# Patient Record
Sex: Female | Born: 1965 | Race: Black or African American | Hispanic: No | Marital: Married | Smoking: Never smoker
Health system: Southern US, Community
[De-identification: ages and names within clinical notes are randomized; demographics above are authoritative.]

## PROBLEM LIST (undated history)

## (undated) DIAGNOSIS — N6009 Solitary cyst of unspecified breast: Secondary | ICD-10-CM

## (undated) DIAGNOSIS — R42 Dizziness and giddiness: Secondary | ICD-10-CM

## (undated) DIAGNOSIS — F419 Anxiety disorder, unspecified: Secondary | ICD-10-CM

## (undated) DIAGNOSIS — K219 Gastro-esophageal reflux disease without esophagitis: Secondary | ICD-10-CM

## (undated) DIAGNOSIS — G44309 Post-traumatic headache, unspecified, not intractable: Secondary | ICD-10-CM

## (undated) DIAGNOSIS — N879 Dysplasia of cervix uteri, unspecified: Secondary | ICD-10-CM

## (undated) DIAGNOSIS — L709 Acne, unspecified: Secondary | ICD-10-CM

## (undated) DIAGNOSIS — R519 Headache, unspecified: Secondary | ICD-10-CM

## (undated) DIAGNOSIS — R51 Headache: Secondary | ICD-10-CM

## (undated) HISTORY — PX: MOUTH SURGERY: SHX715

## (undated) HISTORY — DX: Solitary cyst of unspecified breast: N60.09

## (undated) HISTORY — DX: Dysplasia of cervix uteri, unspecified: N87.9

## (undated) HISTORY — DX: Post-traumatic headache, unspecified, not intractable: G44.309

## (undated) HISTORY — DX: Headache: R51

## (undated) HISTORY — DX: Headache, unspecified: R51.9

## (undated) HISTORY — PX: COLONOSCOPY: SHX174

## (undated) HISTORY — DX: Dizziness and giddiness: R42

## (undated) HISTORY — PX: DILATION AND CURETTAGE OF UTERUS: SHX78

---
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
## 1995-05-29 DIAGNOSIS — N879 Dysplasia of cervix uteri, unspecified: Secondary | ICD-10-CM

## 1995-05-29 HISTORY — DX: Dysplasia of cervix uteri, unspecified: N87.9

## 1995-05-29 HISTORY — PX: GYNECOLOGIC CRYOSURGERY: SHX857

## 1997-07-24 ENCOUNTER — Inpatient Hospital Stay (HOSPITAL_COMMUNITY): Admission: AD | Admit: 1997-07-24 | Discharge: 1997-07-24 | Payer: Self-pay | Admitting: Gynecology

## 1997-07-26 ENCOUNTER — Ambulatory Visit (HOSPITAL_COMMUNITY): Admission: AD | Admit: 1997-07-26 | Discharge: 1997-07-26 | Payer: Self-pay | Admitting: Obstetrics and Gynecology

## 1997-09-01 ENCOUNTER — Encounter: Admission: RE | Admit: 1997-09-01 | Discharge: 1997-11-30 | Payer: Self-pay | Admitting: Gynecology

## 1997-09-29 ENCOUNTER — Other Ambulatory Visit: Admission: RE | Admit: 1997-09-29 | Discharge: 1997-09-29 | Payer: Self-pay | Admitting: Gynecology

## 1998-03-17 ENCOUNTER — Inpatient Hospital Stay (HOSPITAL_COMMUNITY): Admission: AD | Admit: 1998-03-17 | Discharge: 1998-03-17 | Payer: Self-pay | Admitting: Gynecology

## 1998-03-18 ENCOUNTER — Inpatient Hospital Stay (HOSPITAL_COMMUNITY): Admission: AD | Admit: 1998-03-18 | Discharge: 1998-03-20 | Payer: Self-pay | Admitting: Obstetrics and Gynecology

## 1998-03-21 ENCOUNTER — Encounter (HOSPITAL_COMMUNITY): Admission: RE | Admit: 1998-03-21 | Discharge: 1998-06-19 | Payer: Self-pay | Admitting: Gynecology

## 1998-04-29 ENCOUNTER — Other Ambulatory Visit: Admission: RE | Admit: 1998-04-29 | Discharge: 1998-04-29 | Payer: Self-pay | Admitting: Gynecology

## 1998-06-23 ENCOUNTER — Encounter (HOSPITAL_COMMUNITY): Admission: RE | Admit: 1998-06-23 | Discharge: 1998-09-21 | Payer: Self-pay | Admitting: *Deleted

## 1998-09-22 ENCOUNTER — Encounter (HOSPITAL_COMMUNITY): Admission: RE | Admit: 1998-09-22 | Discharge: 1998-12-21 | Payer: Self-pay | Admitting: *Deleted

## 2002-03-18 ENCOUNTER — Inpatient Hospital Stay (HOSPITAL_COMMUNITY): Admission: AD | Admit: 2002-03-18 | Discharge: 2002-03-18 | Payer: Self-pay | Admitting: Gynecology

## 2002-03-18 ENCOUNTER — Encounter: Payer: Self-pay | Admitting: Gynecology

## 2002-03-28 ENCOUNTER — Inpatient Hospital Stay (HOSPITAL_COMMUNITY): Admission: AD | Admit: 2002-03-28 | Discharge: 2002-03-31 | Payer: Self-pay | Admitting: Gynecology

## 2002-05-12 ENCOUNTER — Other Ambulatory Visit: Admission: RE | Admit: 2002-05-12 | Discharge: 2002-05-12 | Payer: Self-pay | Admitting: Gynecology

## 2003-04-28 ENCOUNTER — Ambulatory Visit (HOSPITAL_BASED_OUTPATIENT_CLINIC_OR_DEPARTMENT_OTHER): Admission: RE | Admit: 2003-04-28 | Discharge: 2003-04-28 | Payer: Self-pay | Admitting: Gynecology

## 2003-04-28 ENCOUNTER — Ambulatory Visit (HOSPITAL_COMMUNITY): Admission: RE | Admit: 2003-04-28 | Discharge: 2003-04-28 | Payer: Self-pay | Admitting: Gynecology

## 2003-04-28 ENCOUNTER — Encounter (INDEPENDENT_AMBULATORY_CARE_PROVIDER_SITE_OTHER): Payer: Self-pay | Admitting: *Deleted

## 2003-06-29 ENCOUNTER — Other Ambulatory Visit: Admission: RE | Admit: 2003-06-29 | Discharge: 2003-06-29 | Payer: Self-pay | Admitting: Gynecology

## 2003-07-20 ENCOUNTER — Ambulatory Visit (HOSPITAL_COMMUNITY): Admission: RE | Admit: 2003-07-20 | Discharge: 2003-07-20 | Payer: Self-pay | Admitting: Gynecology

## 2004-01-14 ENCOUNTER — Encounter (INDEPENDENT_AMBULATORY_CARE_PROVIDER_SITE_OTHER): Payer: Self-pay | Admitting: *Deleted

## 2004-01-14 ENCOUNTER — Ambulatory Visit (HOSPITAL_COMMUNITY): Admission: RE | Admit: 2004-01-14 | Discharge: 2004-01-14 | Payer: Self-pay | Admitting: Gynecology

## 2004-04-15 ENCOUNTER — Inpatient Hospital Stay (HOSPITAL_COMMUNITY): Admission: AD | Admit: 2004-04-15 | Discharge: 2004-04-15 | Payer: Self-pay | Admitting: Gynecology

## 2004-07-17 ENCOUNTER — Other Ambulatory Visit: Admission: RE | Admit: 2004-07-17 | Discharge: 2004-07-17 | Payer: Self-pay | Admitting: Gynecology

## 2005-08-01 ENCOUNTER — Other Ambulatory Visit: Admission: RE | Admit: 2005-08-01 | Discharge: 2005-08-01 | Payer: Self-pay | Admitting: Gynecology

## 2006-05-28 HISTORY — PX: ENDOMETRIAL ABLATION: SHX621

## 2006-08-07 ENCOUNTER — Other Ambulatory Visit: Admission: RE | Admit: 2006-08-07 | Discharge: 2006-08-07 | Payer: Self-pay | Admitting: Gynecology

## 2007-08-19 ENCOUNTER — Other Ambulatory Visit: Admission: RE | Admit: 2007-08-19 | Discharge: 2007-08-19 | Payer: Self-pay | Admitting: Gynecology

## 2008-03-25 ENCOUNTER — Ambulatory Visit: Payer: Self-pay | Admitting: Gynecology

## 2008-08-19 ENCOUNTER — Ambulatory Visit: Payer: Self-pay | Admitting: Gynecology

## 2008-08-19 ENCOUNTER — Encounter: Payer: Self-pay | Admitting: Gynecology

## 2008-08-19 ENCOUNTER — Other Ambulatory Visit: Admission: RE | Admit: 2008-08-19 | Discharge: 2008-08-19 | Payer: Self-pay | Admitting: Gynecology

## 2008-10-12 ENCOUNTER — Ambulatory Visit: Payer: Self-pay | Admitting: Gynecology

## 2008-12-15 ENCOUNTER — Ambulatory Visit: Payer: Self-pay | Admitting: Gynecology

## 2009-04-06 ENCOUNTER — Ambulatory Visit: Payer: Self-pay | Admitting: Gynecology

## 2009-08-23 ENCOUNTER — Ambulatory Visit: Payer: Self-pay | Admitting: Gynecology

## 2009-08-23 ENCOUNTER — Other Ambulatory Visit: Admission: RE | Admit: 2009-08-23 | Discharge: 2009-08-23 | Payer: Self-pay | Admitting: Gynecology

## 2010-04-25 ENCOUNTER — Ambulatory Visit: Payer: Self-pay | Admitting: Gynecology

## 2010-07-11 ENCOUNTER — Ambulatory Visit: Payer: BC Managed Care – PPO | Admitting: Gynecology

## 2010-07-11 DIAGNOSIS — N6009 Solitary cyst of unspecified breast: Secondary | ICD-10-CM

## 2010-08-25 ENCOUNTER — Encounter: Payer: Self-pay | Admitting: Gynecology

## 2010-08-28 ENCOUNTER — Other Ambulatory Visit (HOSPITAL_COMMUNITY)
Admission: RE | Admit: 2010-08-28 | Discharge: 2010-08-28 | Disposition: A | Discharge status: Home / Self Care | Payer: BC Managed Care – PPO | Source: Ambulatory Visit | Attending: Gynecology | Admitting: Gynecology

## 2010-08-28 ENCOUNTER — Encounter (INDEPENDENT_AMBULATORY_CARE_PROVIDER_SITE_OTHER): Payer: BC Managed Care – PPO | Admitting: Gynecology

## 2010-08-28 ENCOUNTER — Other Ambulatory Visit: Payer: Self-pay | Admitting: Gynecology

## 2010-08-28 DIAGNOSIS — Z01419 Encounter for gynecological examination (general) (routine) without abnormal findings: Secondary | ICD-10-CM

## 2010-08-28 DIAGNOSIS — Z124 Encounter for screening for malignant neoplasm of cervix: Secondary | ICD-10-CM | POA: Insufficient documentation

## 2010-10-13 NOTE — H&P (Signed)
NAMEDOLORES, EWING                        ACCOUNT NO.:  [id number]   MEDICAL RECORD NO.:  [id number]                   PATIENT TYPE:  AMB   LOCATION:  [facility]                                 FACILITY:  [facility]   PHYSICIAN:  Ivor Costa. Farrel Gobble, M.D.              DATE OF BIRTH:  1966-04-23   DATE OF ADMISSION:  DATE OF DISCHARGE:                                HISTORY & PHYSICAL   HISTORY OF PRESENT ILLNESS:  The patient is a 45 year old, G4, P2, with a  history of erratic periods since September.  The patient had variable length  cycles of approximately two to three weeks in duration with flow varying  from 10-12 days since September.  She is currently contracepting with a  vasectomy.  The patient had TSH and prolactin, both of which were normal.  We attempted to do a sonohysterogram in the office, however, despite two  attempts, we were unable to distend the uterine wall because of uterine  expulsion of the fluid.  Based on that the patient would like to go ahead  and have diagnostic hysteroscopy done for evaluation of her lining.  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  She has normal Pap smears.  She has  had regular cycles, contracepting with a vasectomy.  She has had two vaginal  deliveries without difficulty.   PAST MEDICAL HISTORY:  Negative.   PAST SURGICAL HISTORY:  Significant for a D&C in the past.   ALLERGIES:  MACROBID.   SOCIAL HISTORY:  She is married.  No tobacco, alcohol, or caffeine.   FAMILY HISTORY:  Negative for GYN cancers.   PHYSICAL EXAMINATION:  GENERAL APPEARANCE:  She is well appearing and in no  acute distress.  HEART:  Regular rate.  LUNGS:  Clear to auscultation.  ABDOMEN:  Soft without rebound or guarding.  GYNECOLOGICAL:  She has normal external female genitalia.  The BUS was  negative.  The vagina was pink and moist.  The cervix was parous.  The  uterus was  anteverted, mobile, and nontender.  The adnexa were without  tenderness or fullness.  Her ultrasound showed her uterus to be 9.7 x 4.7 x  6.1 with normal-appearing ovaries.  Again, the sonohysterogram was unable to  be performed.   ASSESSMENT:  A three-month history of dysfunctional bleeding.  Normal  hormonal studies.  The patient will present for diagnostic hysteroscopy to  evaluate the lining.  She had a laminaria placed the day before.  All  questions were addressed.  She was given a postoperative pain prescription  of Darvocet-N 100.                                               French Ana  Angelica Pou, M.D.    THL/MEDQ  D:  04/27/2003  T:  04/27/2003  Job:  [id number]

## 2010-10-13 NOTE — Op Note (Signed)
NAME:  Debra Combs, Debra Combs                        ACCOUNT NO.:  [id number]   MEDICAL RECORD NO.:  [id number]                   PATIENT TYPE:  AMB   LOCATION:  [facility]C                                  FACILITY:  [facility]   PHYSICIAN:  Ivor Costa. Farrel Gobble, M.D.              DATE OF BIRTH:  [birth year]   DATE OF PROCEDURE:  01/14/2004  DATE OF DISCHARGE:                                 OPERATIVE REPORT   PREOPERATIVE DIAGNOSES:  __________ pregnancy at 8 weeks.   POSTOPERATIVE DIAGNOSES:  __________ pregnancy at 8 weeks.   PROCEDURE:  First trimester D & E.   SURGEON:  Tracy H. Farrel Gobble, M.D.   ANESTHESIA:  MAC with a paracervical block.   FINDINGS:  Smooth uterine contours.   PATHOLOGY:  Uterine contents.   DESCRIPTION OF PROCEDURE:  The patient was taken to the operating room, a  light MAC was placed, prepped and draped in the usual sterile fashion. A  bimanual exam was performed, orientation of the uterus was confirmed. The  bivalve speculum was placed in the vagina, cervix was visualized and a  paracervical block with equal portions of 2% lidocaine and 0.25% Marcaine  was placed circumferentially around the cervix. The cervix was then  stabilized with a single tooth tenaculum. The orientation of the canal was  confirmed with the sound. The uterus was then gently dilated up to 29 [place]  after which a #9 suction curette was advanced through the cervix towards the  fundus. Suction was obtained, uterus was cleared of its contents. Gentle  curetting afterwards assured a smooth wall and good cry.  The patient  tolerated the procedure well. Sponge, sharp needle counts were correct and  she was given Methergine postoperatively.                                               Ivor Costa. Farrel Gobble, M.D.    THL/MEDQ  D:  01/14/2004  T:  01/14/2004  Job:  [id number]

## 2010-10-13 NOTE — Discharge Summary (Signed)
   Debra Combs, Debra Combs                        ACCOUNT NO.:  [id number]   MEDICAL RECORD NO.:  [id number]                   PATIENT TYPE:  INP   LOCATION:  9143                                 FACILITY:  [facility]   PHYSICIAN:  Ivor Costa. Farrel Gobble, M.D.              DATE OF BIRTH:  16-Mar-1966   DATE OF ADMISSION:  03/28/2002  DATE OF DISCHARGE:  03/31/2002                                 DISCHARGE SUMMARY   DISCHARGE DIAGNOSES:  1. Intrauterine pregnancy at term.  2. Spontaneous onset of labor.   PROCEDURE:  Vacuum assisted vaginal delivery of viable infant over intact  perineum with a para second degree laceration.   HISTORY OF PRESENT ILLNESS:  Patient is a 45 year old, gravida 5, para 1-0-3-  1 with an LMP of 06/28/00.  [facility] 04/04/02.  Prenatal course was complicated by  advanced paternal age and history of urinary tract infection.   LABORATORY TESTS:  Blood type O negative, antibody screen negative.  RPR,  HBsAg, HIB  nonreactive.  Rubella immune.  GBS negative.   HOSPITAL COURSE AND TREATMENT:  Patient was admitted 03/28/02 with  spontaneous onset of labor.  She progressed to complete dilation secondary  to variable deceleration and terminal bradycardia.  Delivery was  accomplished with vacuum assistance.  She delivered an Apgar 6, 8, female  infant.  Birth weight 8 pounds, 8 ounces.  Over intact perineum with a pair  of second degree midline laceration.   POSTPARTUM COURSE:  Patient remained afebrile and was able to be discharged  in satisfactory condition on her second post partum day.  CBC hematocrit:  32.9, hemoglobin: 11.5, WBC: 11.8, platelets: 189.  Patient did receive  RhoGAM prior to discharge.   DISPOSITION:  Follow up in 6 weeks.  Continue Prenatal vitamins.  Motrin for  pain.     Elwyn Lade . Hancock, N.P.                Ivor Costa. Farrel Gobble, M.D.    MKH/MEDQ  D:  04/27/2002  T:  04/27/2002  Job:  [id number]

## 2010-10-13 NOTE — Op Note (Signed)
[name], Debra Combs                        ACCOUNT NO.:  [id number]   MEDICAL RECORD NO.:  [id number]                   PATIENT TYPE:  AMB   LOCATION:  [facility]                                 FACILITY:  [facility]   PHYSICIAN:  Ivor Costa. Farrel Gobble, M.D.              DATE OF BIRTH:  Jun 09, 1965   DATE OF PROCEDURE:  04/28/2003  DATE OF DISCHARGE:                                 OPERATIVE REPORT   PREOPERATIVE DIAGNOSES:  1. Metrorrhagia.  2. Dysfunctional uterine bleeding.   POSTOPERATIVE DIAGNOSES:  1. Metrorrhagia.  2. Dysfunctional uterine bleeding.   PROCEDURE:  Dilatation and curettage, hysteroscopy.   SURGEON:  Ivor Costa. Farrel Gobble, M.D.   ANESTHESIA:  MAC.   ESTIMATED BLOOD LOSS:  Minimal.   I&O deficit of 3% sorbitol solution was less than 50 mL.   FINDINGS:  Normal cavity contours.  The uterus was anteverted and sounded to  8.  There was a small left endosalpingeal polyp.   PATHOLOGY:  Endometrial curettings and ECC.   DESCRIPTION OF PROCEDURE:  The patient was taken to the operating room.  A  laminaria placed the night before was removed.  IV sedation was placed.  She  was placed in the dorsal lithotomy position, prepped and draped in the usual  sterile fashion.  A bivalve speculum was placed at the vagina.  The cervix  was noted to be dilated.  The sound was placed and the contour of the uterus  was confirmed.  The uterus was stabilized with a single-tooth tenaculum.  It  was noted to be dilated greater than 25 [place]. We then passed the larger  curettes, and it was actually noted to be dilated above the 31 needed for  the operative scope; therefore, the operative scope was advanced through the  cervix toward the fundus.  The cavity contours were unremarkable.  There was  what is probably dilation artifact on the posterior wall.  This was taken  for pathology.  The right tubal ostium was unremarkable.  The left was noted  to have a small endosalpingeal polyp.  The base  where the defect had been  taken was slightly bleeding and treated with cautery.  A gentle uterine  curettage was then performed.  An endocervical curettage was also separately  performed.  Inspection of the cavity was consistent with shaggy endometrium  as a result of the curettings but otherwise unremarkable.  The instruments  were then removed from the vagina.  The patient tolerated the procedure  well.  Sponge, lap, and needle count was correct x2, and she was transferred  to the PACU in stable condition.                                               Ivor Costa. Farrel Gobble, M.D.  THL/MEDQ  D:  04/28/2003  T:  04/28/2003  Job:  [id number]

## 2010-10-19 ENCOUNTER — Emergency Department (HOSPITAL_COMMUNITY)
Admission: EM | Admit: 2010-10-19 | Discharge: 2010-10-20 | Disposition: A | Discharge status: Home / Self Care | Payer: BC Managed Care – PPO | Attending: Emergency Medicine | Admitting: Emergency Medicine

## 2010-10-19 ENCOUNTER — Emergency Department (HOSPITAL_COMMUNITY): Payer: BC Managed Care – PPO

## 2010-10-19 DIAGNOSIS — X500XXA Overexertion from strenuous movement or load, initial encounter: Secondary | ICD-10-CM | POA: Insufficient documentation

## 2010-10-19 DIAGNOSIS — S93609A Unspecified sprain of unspecified foot, initial encounter: Secondary | ICD-10-CM | POA: Insufficient documentation

## 2010-10-19 DIAGNOSIS — M79609 Pain in unspecified limb: Secondary | ICD-10-CM | POA: Insufficient documentation

## 2010-10-19 DIAGNOSIS — Y9301 Activity, walking, marching and hiking: Secondary | ICD-10-CM | POA: Insufficient documentation

## 2010-12-13 ENCOUNTER — Emergency Department (HOSPITAL_COMMUNITY)
Admission: EM | Admit: 2010-12-13 | Discharge: 2010-12-13 | Disposition: A | Discharge status: Home / Self Care | Payer: BC Managed Care – PPO | Attending: Emergency Medicine | Admitting: Emergency Medicine

## 2010-12-13 ENCOUNTER — Emergency Department (HOSPITAL_COMMUNITY): Payer: BC Managed Care – PPO

## 2010-12-13 DIAGNOSIS — Z79899 Other long term (current) drug therapy: Secondary | ICD-10-CM | POA: Insufficient documentation

## 2010-12-13 DIAGNOSIS — R42 Dizziness and giddiness: Secondary | ICD-10-CM | POA: Insufficient documentation

## 2010-12-13 DIAGNOSIS — K219 Gastro-esophageal reflux disease without esophagitis: Secondary | ICD-10-CM | POA: Insufficient documentation

## 2010-12-13 LAB — COMPREHENSIVE METABOLIC PANEL
Albumin: 3.5 g/dL (ref 3.5–5.2)
BUN: 11 mg/dL (ref 6–23)
CO2: 25 mEq/L (ref 19–32)
Calcium: 9.6 mg/dL (ref 8.4–10.5)
Chloride: 103 mEq/L (ref 96–112)
Creatinine, Ser: 0.8 mg/dL (ref 0.50–1.10)
GFR calc non Af Amer: >60 mL/min (ref >60)
Total Bilirubin: 0.3 mg/dL (ref 0.3–1.2)

## 2010-12-13 LAB — CBC
HCT: 41 % (ref 36.0–46.0)
MCH: 31.1 pg (ref 26.0–34.0)
MCV: 92.3 fL (ref 78.0–100.0)
Platelets: 238 10*3/uL (ref 150–400)
RDW: 12.2 % (ref 11.5–15.5)
WBC: 5.9 10*3/uL (ref 4.0–10.5)

## 2010-12-13 LAB — DIFFERENTIAL
Eosinophils Absolute: 0.1 10*3/uL (ref 0.0–0.7)
Eosinophils Relative: 2 % (ref 0–5)
Lymphocytes Relative: 36 % (ref 12–46)
Lymphs Abs: 2.2 10*3/uL (ref 0.7–4.0)
Monocytes Relative: 11 % (ref 3–12)

## 2011-04-06 ENCOUNTER — Encounter: Payer: Self-pay | Admitting: Gynecology

## 2011-05-09 ENCOUNTER — Encounter: Payer: Self-pay | Admitting: Gynecology

## 2011-05-14 ENCOUNTER — Encounter: Payer: Self-pay | Admitting: Gynecology

## 2011-05-14 ENCOUNTER — Ambulatory Visit (INDEPENDENT_AMBULATORY_CARE_PROVIDER_SITE_OTHER): Payer: BC Managed Care – PPO | Admitting: Gynecology

## 2011-05-14 DIAGNOSIS — N6089 Other benign mammary dysplasias of unspecified breast: Secondary | ICD-10-CM

## 2011-05-14 NOTE — Progress Notes (Signed)
Patient presents complaining of an uncomfortable mass left breast. She was seen earlier this year when she had a sebaceous cyst under her left breast in the same area, on follow up exam it had gone away and now seems to have recurred by her history.  Exam with chaperone present Both breasts examined lying and sitting without masses retractions discharge adenopathy. She has a classic sebaceous cyst approximately 1 cm, fluctuant at the junction of her left breast and chest wall at 7:00 position.  Assessment and plan: Classic sebaceous cyst left breast, recurrent. Mammogram November 2012 was negative. Options for management were reviewed and she would prefer to have it excised as it is in her bra line and is uncomfortable and we will refer to Gen. Surgery. She knows that we will call her with this appointment in the next several days.

## 2011-05-14 NOTE — Patient Instructions (Signed)
Office will arrange for follow up appointment with Gen. Surgery.

## 2011-06-04 ENCOUNTER — Telehealth: Payer: Self-pay | Admitting: *Deleted

## 2011-06-04 ENCOUNTER — Telehealth: Payer: Self-pay

## 2011-06-04 ENCOUNTER — Other Ambulatory Visit: Payer: Self-pay | Admitting: *Deleted

## 2011-06-04 DIAGNOSIS — N6089 Other benign mammary dysplasias of unspecified breast: Secondary | ICD-10-CM

## 2011-06-04 NOTE — Telephone Encounter (Signed)
This patient called in my voice mail because she said Dr. Velvet Bathe had told her she would hear from [place] in a couple of days from her 05/15/11 appt with him regarding referral to surgeon. She said the breast cyst is larger and painful and she is calling to check on that appointment.  I forwarded this info to Amy H. To see if she had received info on this and was perhaps working on scheduling for the patient or if not she could start the process of getting her in with a Careers adviser.

## 2011-06-04 NOTE — Telephone Encounter (Signed)
Patient had called to say she didn't get call back about appt with General Surgeon.  Patient was scheduled to be seen at [facility] with Dr. Derrell Lolling on 06/09/11 @ 2:45 and gave patient their contact number incase it needs to be moved up.

## 2011-06-05 ENCOUNTER — Ambulatory Visit (INDEPENDENT_AMBULATORY_CARE_PROVIDER_SITE_OTHER): Payer: BC Managed Care – PPO | Admitting: General Surgery

## 2011-06-05 ENCOUNTER — Encounter (INDEPENDENT_AMBULATORY_CARE_PROVIDER_SITE_OTHER): Payer: Self-pay | Admitting: General Surgery

## 2011-06-05 VITALS — BP 118/80 | Temp 98.1°F | Ht 63.0 in | Wt 194.8 lb

## 2011-06-05 DIAGNOSIS — N6089 Other benign mammary dysplasias of unspecified breast: Secondary | ICD-10-CM

## 2011-06-05 MED ORDER — DOXYCYCLINE HYCLATE 50 MG PO CAPS: 50.0000 mg | ORAL_CAPSULE | Freq: Two times a day (BID) | ORAL | Status: AC

## 2011-06-05 NOTE — Progress Notes (Signed)
HPI The patient comes in with a complaint of a cyst underneath in the medial aspect of her left breast. She has had it for at least one year but has increased in size the last 2 weeks and become increasingly more symptomatic. He just but has not drained and she's had no fevers or chills. She now comes in for evaluation there  PE Her examination is limited to her chest and breast area. On the medial anterior aspect of her left breast is a 3 x 4 cm arm but not fluctuant mass likely representing a sebaceous cyst in that area. There is some mild erythema but no significant drainage. He does not warm. There is no associated breast change with that.  Studiy review There are no current studies to review.  Assessment Mildly inflamed but not grossly infected sebaceous cyst on the medial anterior aspect of her left breast.  Plan This can be excised as an outpatient at [facility]. This will be scheduled today.

## 2011-06-06 ENCOUNTER — Encounter (HOSPITAL_BASED_OUTPATIENT_CLINIC_OR_DEPARTMENT_OTHER): Payer: Self-pay | Admitting: *Deleted

## 2011-06-06 NOTE — Progress Notes (Signed)
Pt take spirolactone for acne-has not had in several week-so no labs ordered Cyst not drg

## 2011-06-07 ENCOUNTER — Ambulatory Visit (INDEPENDENT_AMBULATORY_CARE_PROVIDER_SITE_OTHER): Payer: BC Managed Care – PPO | Admitting: Surgery

## 2011-06-07 ENCOUNTER — Encounter (INDEPENDENT_AMBULATORY_CARE_PROVIDER_SITE_OTHER): Payer: Self-pay | Admitting: Surgery

## 2011-06-07 VITALS — BP 124/84 | Temp 98.4°F | Ht 63.0 in | Wt 194.0 lb

## 2011-06-07 DIAGNOSIS — N6089 Other benign mammary dysplasias of unspecified breast: Secondary | ICD-10-CM

## 2011-06-07 NOTE — Progress Notes (Signed)
[facility]  Ovidio Kin, MD,  FACS [address]  [address] [place], [place]    [address] Phone:  [phone] FAX:  [phone]   Re:   Debra Combs DOB:   1965-07-04 MRN:   [id number]  Urgent Clinic  ASSESSMENT AND PLAN: 1.  Infected cyst on left breast.  On Cipro/Doxycline - for cyst.  I&D today.  She's to wash incision 3 x per day.  See either Dr. Lindie Spruce or me back next week to check incision.  She is given literature on sebaceous cysts.  2.  Asthma.  HISTORY OF PRESENT ILLNESS: No chief complaint on file.   AURELIA GRAS is a 46 y.o. (DOB: 09-Jun-1965)  AA female who is a patient of Adaku Nnodi, MD and comes to me today for infected cyst on left breast.  She had this cyst since last April.  Had seen Dr. Seleta Rhymes, but they were watching it.  It got larger and she saw Dr. Lindie Spruce, he had planned to excise it tomorrow, but the cyst drained last PM.  So she is in the urgent office today with an infected cyst at the inframammary fold of the left breast.  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  PHYSICAL EXAM: BP 124/84  Temp 98.4 F (36.9 C)  Ht 5\' 3"  (1.6 m)  Wt 194 lb (87.998 kg)  BMI 34.37 kg/m2  LMP 05/14/2011  Breasts:  3+ cm infected cyst at the medial infra-mammary fold of left breast.  No other breast lesion.  PROCEDURE:  The area was prepped with Betadine.  I infiltrated 6 cc of 1% xylocaine and did an I&D of the infected sebaceous cyst.  I packed it with gauze.  DATA REVIEWED: Dr. Dixon Boos note.   Ovidio Kin, MD, FACS Office:  [phone]

## 2011-06-08 ENCOUNTER — Ambulatory Visit (HOSPITAL_BASED_OUTPATIENT_CLINIC_OR_DEPARTMENT_OTHER): Admission: RE | Admit: 2011-06-08 | Payer: BC Managed Care – PPO | Source: Ambulatory Visit | Admitting: General Surgery

## 2011-06-08 ENCOUNTER — Encounter (HOSPITAL_BASED_OUTPATIENT_CLINIC_OR_DEPARTMENT_OTHER): Admission: RE | Payer: Self-pay | Source: Ambulatory Visit

## 2011-06-08 HISTORY — PX: BREAST CYST EXCISION: SHX579

## 2011-06-08 HISTORY — DX: Acne, unspecified: L70.9

## 2011-06-08 SURGERY — EXCISION MASS
Anesthesia: Monitor Anesthesia Care | Laterality: Left

## 2011-06-12 ENCOUNTER — Ambulatory Visit (INDEPENDENT_AMBULATORY_CARE_PROVIDER_SITE_OTHER): Payer: BC Managed Care – PPO | Admitting: General Surgery

## 2011-06-12 ENCOUNTER — Encounter (INDEPENDENT_AMBULATORY_CARE_PROVIDER_SITE_OTHER): Payer: Self-pay | Admitting: General Surgery

## 2011-06-12 VITALS — BP 122/82 | HR 90 | Temp 98.6°F | Ht 63.0 in | Wt 194.6 lb

## 2011-06-12 DIAGNOSIS — L723 Sebaceous cyst: Secondary | ICD-10-CM

## 2011-06-12 DIAGNOSIS — L089 Local infection of the skin and subcutaneous tissue, unspecified: Secondary | ICD-10-CM

## 2011-06-12 NOTE — Progress Notes (Signed)
Doing well after I & D of left medial and inferior infected sebaceous cyst drainage.  Will see patient back in office in one month to plan for excision.  Should only take Doxycycline for 5 more day.s

## 2011-06-18 ENCOUNTER — Ambulatory Visit (INDEPENDENT_AMBULATORY_CARE_PROVIDER_SITE_OTHER): Payer: BC Managed Care – PPO | Admitting: General Surgery

## 2011-07-10 ENCOUNTER — Encounter (INDEPENDENT_AMBULATORY_CARE_PROVIDER_SITE_OTHER): Payer: Self-pay | Admitting: General Surgery

## 2011-07-10 ENCOUNTER — Ambulatory Visit (INDEPENDENT_AMBULATORY_CARE_PROVIDER_SITE_OTHER): Payer: BC Managed Care – PPO | Admitting: General Surgery

## 2011-07-10 VITALS — BP 132/90 | HR 80 | Temp 97.8°F | Resp 12 | Ht 63.0 in | Wt 195.6 lb

## 2011-07-10 DIAGNOSIS — N6089 Other benign mammary dysplasias of unspecified breast: Secondary | ICD-10-CM

## 2011-07-10 NOTE — Progress Notes (Signed)
HPI The patient is status post incision and drainage of left medial breast sebaceous cyst which he got infected  PE On examination the area is completely healed with no drainage. There is no erythema surrounding it. It was an upper medial aspect of the breast fold  Studiy review None.  Assessment Complete resolution and healing of medial infected sebaceous cyst. Time to go ahead and get this excised completely  Plan And the patient was scheduled for surgery for excision of medial left breast sebaceous cyst

## 2011-07-10 NOTE — Assessment & Plan Note (Signed)
Excise now that infection has resolved

## 2011-07-18 ENCOUNTER — Encounter (HOSPITAL_BASED_OUTPATIENT_CLINIC_OR_DEPARTMENT_OTHER): Payer: Self-pay | Admitting: *Deleted

## 2011-07-18 NOTE — Progress Notes (Signed)
surg was r/s-no labs needed

## 2011-07-20 ENCOUNTER — Encounter (HOSPITAL_BASED_OUTPATIENT_CLINIC_OR_DEPARTMENT_OTHER): Payer: Self-pay | Admitting: Anesthesiology

## 2011-07-20 ENCOUNTER — Encounter (HOSPITAL_BASED_OUTPATIENT_CLINIC_OR_DEPARTMENT_OTHER)
Admission: RE | Disposition: A | Discharge status: Home / Self Care | Payer: Self-pay | Source: Ambulatory Visit | Attending: General Surgery

## 2011-07-20 ENCOUNTER — Ambulatory Visit (HOSPITAL_BASED_OUTPATIENT_CLINIC_OR_DEPARTMENT_OTHER)
Admission: RE | Admit: 2011-07-20 | Discharge: 2011-07-20 | Disposition: A | Discharge status: Home / Self Care | Payer: BC Managed Care – PPO | Source: Ambulatory Visit | Attending: General Surgery | Admitting: General Surgery

## 2011-07-20 ENCOUNTER — Ambulatory Visit (HOSPITAL_BASED_OUTPATIENT_CLINIC_OR_DEPARTMENT_OTHER): Payer: BC Managed Care – PPO | Admitting: Anesthesiology

## 2011-07-20 ENCOUNTER — Encounter (HOSPITAL_BASED_OUTPATIENT_CLINIC_OR_DEPARTMENT_OTHER): Payer: Self-pay | Admitting: *Deleted

## 2011-07-20 ENCOUNTER — Other Ambulatory Visit (INDEPENDENT_AMBULATORY_CARE_PROVIDER_SITE_OTHER): Payer: Self-pay | Admitting: General Surgery

## 2011-07-20 DIAGNOSIS — N6089 Other benign mammary dysplasias of unspecified breast: Secondary | ICD-10-CM

## 2011-07-20 HISTORY — DX: Gastro-esophageal reflux disease without esophagitis: K21.9

## 2011-07-20 HISTORY — DX: Anxiety disorder, unspecified: F41.9

## 2011-07-20 SURGERY — EXCISION OF BREAST BIOPSY
Anesthesia: Monitor Anesthesia Care | Laterality: Left | Wound class: Clean

## 2011-07-20 MED ORDER — LACTATED RINGERS IV SOLN
INTRAVENOUS | Status: DC
  Administered 2011-07-20: 07:00:00 via INTRAVENOUS

## 2011-07-20 MED ORDER — LORAZEPAM 2 MG/ML IJ SOLN: 1.0000 mg | Freq: Once | INTRAMUSCULAR | Status: DC | PRN

## 2011-07-20 MED ORDER — 0.9 % SODIUM CHLORIDE (POUR BTL) OPTIME
TOPICAL | Status: DC | PRN
  Administered 2011-07-20: 100 mL

## 2011-07-20 MED ORDER — PROMETHAZINE HCL 25 MG/ML IJ SOLN: 6.2500 mg | INTRAMUSCULAR | Status: DC | PRN

## 2011-07-20 MED ORDER — MIDAZOLAM HCL 2 MG/2ML IJ SOLN: 1.0000 mg | INTRAMUSCULAR | Status: DC | PRN

## 2011-07-20 MED ORDER — LIDOCAINE HCL (CARDIAC) 20 MG/ML IV SOLN
INTRAVENOUS | Status: DC | PRN
  Administered 2011-07-20: 50 mg via INTRAVENOUS

## 2011-07-20 MED ORDER — LIDOCAINE HCL (PF) 1 % IJ SOLN
INTRAMUSCULAR | Status: DC | PRN
  Administered 2011-07-20: 9 mL

## 2011-07-20 MED ORDER — MIDAZOLAM HCL 5 MG/5ML IJ SOLN
INTRAMUSCULAR | Status: DC | PRN
  Administered 2011-07-20: 2 mg via INTRAVENOUS

## 2011-07-20 MED ORDER — FENTANYL CITRATE 0.05 MG/ML IJ SOLN
50.0000 ug | INTRAMUSCULAR | Status: DC | PRN
  Administered 2011-07-20: 100 ug via INTRAVENOUS

## 2011-07-20 MED ORDER — HYDROMORPHONE HCL PF 1 MG/ML IJ SOLN: 0.2500 mg | INTRAMUSCULAR | Status: DC | PRN

## 2011-07-20 MED ORDER — ONDANSETRON HCL 4 MG/2ML IJ SOLN
INTRAMUSCULAR | Status: DC | PRN
  Administered 2011-07-20: 4 mg via INTRAVENOUS

## 2011-07-20 MED ORDER — CEFAZOLIN SODIUM-DEXTROSE 2-3 GM-% IV SOLR: 2.0000 g | INTRAVENOUS | Status: DC

## 2011-07-20 MED ORDER — HYDROCODONE-ACETAMINOPHEN 5-500 MG PO TABS: 1.0000 | ORAL_TABLET | Freq: Four times a day (QID) | ORAL | Status: DC | PRN

## 2011-07-20 MED ORDER — PROPOFOL 10 MG/ML IV EMUL
INTRAVENOUS | Status: DC | PRN
  Administered 2011-07-20 (×2): 20 mg via INTRAVENOUS

## 2011-07-20 SURGICAL SUPPLY — 56 items
ADH SKN CLS APL DERMABOND .7 (GAUZE/BANDAGES/DRESSINGS) ×1
BLADE SURG 10 STRL SS (BLADE) IMPLANT
BLADE SURG 15 STRL LF DISP TIS (BLADE) ×1 IMPLANT
BLADE SURG 15 STRL SS (BLADE) ×2
CANISTER SUCTION 1200CC (MISCELLANEOUS) IMPLANT
CHLORAPREP W/TINT 26ML (MISCELLANEOUS) ×2 IMPLANT
CLEANER CAUTERY TIP 5X5 PAD (MISCELLANEOUS) ×1 IMPLANT
CLIP TI MEDIUM 6 (CLIP) IMPLANT
CLOSURE STERI STRIP 1/2 X4 (GAUZE/BANDAGES/DRESSINGS) ×1 IMPLANT
CLOTH BEACON ORANGE TIMEOUT ST (SAFETY) ×2 IMPLANT
COVER MAYO STAND STRL (DRAPES) ×2 IMPLANT
COVER TABLE BACK 60X90 (DRAPES) ×2 IMPLANT
DECANTER SPIKE VIAL GLASS SM (MISCELLANEOUS) IMPLANT
DERMABOND ADVANCED (GAUZE/BANDAGES/DRESSINGS) ×1
DERMABOND ADVANCED .7 DNX12 (GAUZE/BANDAGES/DRESSINGS) ×1 IMPLANT
DEVICE DUBIN W/COMP PLATE 8390 (MISCELLANEOUS) IMPLANT
DRAPE LAPAROTOMY TRNSV 102X78 (DRAPE) IMPLANT
DRAPE PED LAPAROTOMY (DRAPES) ×2 IMPLANT
DRAPE UTILITY XL STRL (DRAPES) ×4 IMPLANT
DRSG TEGADERM 2-3/8X2-3/4 SM (GAUZE/BANDAGES/DRESSINGS) ×1 IMPLANT
DRSG TEGADERM 4X4.75 (GAUZE/BANDAGES/DRESSINGS) IMPLANT
ELECT REM PT RETURN 9FT ADLT (ELECTROSURGICAL) ×2
ELECTRODE REM PT RTRN 9FT ADLT (ELECTROSURGICAL) ×1 IMPLANT
GLOVE BIOGEL PI IND STRL 7.0 (GLOVE) IMPLANT
GLOVE BIOGEL PI IND STRL 8 (GLOVE) ×1 IMPLANT
GLOVE BIOGEL PI INDICATOR 7.0 (GLOVE) ×2
GLOVE BIOGEL PI INDICATOR 8 (GLOVE) ×1
GLOVE ECLIPSE 6.5 STRL STRAW (GLOVE) ×1 IMPLANT
GLOVE ECLIPSE 7.0 STRL STRAW (GLOVE) ×1 IMPLANT
GLOVE ECLIPSE 7.5 STRL STRAW (GLOVE) ×2 IMPLANT
GOWN PREVENTION PLUS XLARGE (GOWN DISPOSABLE) ×3 IMPLANT
NDL HYPO 25X1 1.5 SAFETY (NEEDLE) IMPLANT
NEEDLE 27GAX1X1/2 (NEEDLE) ×1 IMPLANT
NEEDLE HYPO 22GX1.5 SAFETY (NEEDLE) IMPLANT
NEEDLE HYPO 25X1 1.5 SAFETY (NEEDLE) ×2 IMPLANT
PACK BASIN DAY SURGERY FS (CUSTOM PROCEDURE TRAY) ×2 IMPLANT
PAD CLEANER CAUTERY TIP 5X5 (MISCELLANEOUS) ×1
PENCIL BUTTON HOLSTER BLD 10FT (ELECTRODE) ×2 IMPLANT
SLEEVE SCD COMPRESS KNEE MED (MISCELLANEOUS) ×1 IMPLANT
SPONGE LAP 4X18 X RAY DECT (DISPOSABLE) ×2 IMPLANT
STRIP CLOSURE SKIN 1/4X4 (GAUZE/BANDAGES/DRESSINGS) ×2 IMPLANT
SUCTION FRAZIER TIP 10 FR DISP (SUCTIONS) IMPLANT
SUT MON AB 4-0 PC3 18 (SUTURE) ×2 IMPLANT
SUT PROLENE 4 0 PS 2 18 (SUTURE) IMPLANT
SUT VIC AB 3-0 54X BRD REEL (SUTURE) IMPLANT
SUT VIC AB 3-0 BRD 54 (SUTURE)
SUT VIC AB 3-0 SH 27 (SUTURE)
SUT VIC AB 3-0 SH 27X BRD (SUTURE) IMPLANT
SUT VIC AB 4-0 SH 27 (SUTURE) ×2
SUT VIC AB 4-0 SH 27XANBCTRL (SUTURE) IMPLANT
SYR BULB 3OZ (MISCELLANEOUS) IMPLANT
SYR CONTROL 10ML LL (SYRINGE) ×2 IMPLANT
TOWEL OR 17X24 6PK STRL BLUE (TOWEL DISPOSABLE) ×4 IMPLANT
TUBE CONNECTING 20X1/4 (TUBING) IMPLANT
WATER STERILE IRR 1000ML POUR (IV SOLUTION) ×1 IMPLANT
YANKAUER SUCT BULB TIP NO VENT (SUCTIONS) IMPLANT

## 2011-07-20 NOTE — H&P (Signed)
  Progress Notes     HPI  The patient comes in with a complaint of a cyst underneath in the medial aspect of her left breast. She has had it for at least one year but has increased in size the last 2 weeks and become increasingly more symptomatic. He just but has not drained and she's had no fevers or chills. She now comes in for evaluation there  PE  Her examination is limited to her chest and breast area. On the medial anterior aspect of her left breast is a 3 x 4 cm arm but not fluctuant mass likely representing a sebaceous cyst in that area. There is some mild erythema but no significant drainage. He does not warm. There is no associated breast change with that.  Studiy review  There are no current studies to review.  Assessment  Mildly inflamed but not grossly infected sebaceous cyst on the medial anterior aspect of her left breast.  Plan  This can be excised as an outpatient at [facility]. This will be scheduled today.     After the initial visit the patient had this medial left breast cyst incised and drained in the office by [name].  It has healed since that time and is now ready for excision.  Marta Lamas. Gae Bon, MD, FACS [phone] [facility]

## 2011-07-20 NOTE — Op Note (Signed)
OPERATIVE REPORT  DATE OF OPERATION: 07/20/2011  PATIENT:  Debra Combs  46 y.o. female  PRE-OPERATIVE DIAGNOSIS:  Medial Left Breast Sebaceous Cyst  POST-OPERATIVE DIAGNOSIS:  Medial Left Sebaceous cyst  PROCEDURE:  Procedure(s): EXCISION OF BREAST BIOPSY  SURGEON:  Surgeon(s): Cherylynn Ridges III, MD  ASSISTANT: None  ANESTHESIA:   local and MAC  EBL: <20 ml  BLOOD ADMINISTERED: none  DRAINS: none   SPECIMEN:  Source of Specimen:  Left medial breast excised sebaceous cyst  COUNTS CORRECT:  YES  PROCEDURE DETAILS: The patient was taken to the operating room and placed on the table in the supine position. She had a monitored anesthesia care anesthetic administered with sedation given IV. She was chlorpropamide and draped in usual sterile manner.  After a proper time out was performed identifying the patient and the procedure to be performed we outlined the area on the medial mid portion of the inferior left mammary fold using a marking pen. We then injected the area outlined with 1% Xylocaine without epinephrine using a 25-gauge needle. Once the local anesthetic had taken we used a 15 blade to incise the area outlined taking care not to enter the sebaceous cyst cavity.  This is done without problem or concern. We excised the area which measured approximately 0.5 x 4 cm in size. Hemostasis was obtained with electrocautery. Once this was done the wound was closed in 2 layers after irrigating with saline solution. The subcutaneous layer of 4-0 Vicryl and then skin was closed using a running subcuticular stitch of 4-0 Monocryl. Dermabond Steri-Strips and Tegaderm used to complete the dressing. All counts were correct including needle sponges and instruments.  PATIENT DISPOSITION:  PACU - hemodynamically stable.   Norina Cowper III,Kaya Pottenger O 2/22/20138:13 AM

## 2011-07-20 NOTE — Transfer of Care (Signed)
Immediate Anesthesia Transfer of Care Note  Patient: Debra Combs  Procedure(s) Performed: Procedure(s) (LRB): EXCISION OF BREAST BIOPSY (Left)  Patient Location: PACU  Anesthesia Type: MAC  Level of Consciousness: awake  Airway & Oxygen Therapy: Patient Spontanous Breathing and Patient connected to face mask oxygen  Post-op Assessment: Report given to PACU RN and Post -op Vital signs reviewed and stable  Post vital signs: Reviewed and stable  Complications: No apparent anesthesia complications

## 2011-07-20 NOTE — Anesthesia Preprocedure Evaluation (Signed)
Anesthesia Evaluation  Patient identified by MRN, date of birth, ID band Patient awake    Reviewed: Allergy & Precautions, H&P , NPO status , Patient's Chart, lab work & pertinent test results  History of Anesthesia Complications (+) AWARENESS UNDER ANESTHESIA  Airway Mallampati: I TM Distance: >3 FB Neck ROM: Full    Dental   Pulmonary asthma ,    Pulmonary exam normal       Cardiovascular     Neuro/Psych    GI/Hepatic GERD-  ,  Endo/Other    Renal/GU      Musculoskeletal   Abdominal   Peds  Hematology   Anesthesia Other Findings   Reproductive/Obstetrics                           Anesthesia Physical Anesthesia Plan  ASA: II  Anesthesia Plan: MAC   Post-op Pain Management:    Induction: Intravenous  Airway Management Planned: Simple Face Mask  Additional Equipment:   Intra-op Plan:   Post-operative Plan:   Informed Consent: I have reviewed the patients History and Physical, chart, labs and discussed the procedure including the risks, benefits and alternatives for the proposed anesthesia with the patient or authorized representative who has indicated his/her understanding and acceptance.     Plan Discussed with: CRNA and Surgeon  Anesthesia Plan Comments:         Anesthesia Quick Evaluation

## 2011-07-20 NOTE — Anesthesia Postprocedure Evaluation (Signed)
  Anesthesia Post-op Note  Patient: Debra Combs  Procedure(s) Performed: Procedure(s) (LRB): EXCISION OF BREAST BIOPSY (Left)  Patient Location: PACU  Anesthesia Type: MAC  Level of Consciousness: awake  Airway and Oxygen Therapy: Patient Spontanous Breathing  Post-op Pain: mild  Post-op Assessment: Post-op Vital signs reviewed, Patient's Cardiovascular Status Stable, Respiratory Function Stable, Patent Airway, No signs of Nausea or vomiting and Pain level controlled  Post-op Vital Signs: stable  Complications: No apparent anesthesia complications

## 2011-07-20 NOTE — Discharge Instructions (Signed)

## 2011-07-20 NOTE — Anesthesia Procedure Notes (Signed)
Procedure Name: MAC Date/Time: 07/20/2011 7:30 AM Performed by: Zenia Resides D Pre-anesthesia Checklist: Patient identified, Emergency Drugs available, Suction available, Patient being monitored and Timeout performed Patient Re-evaluated:Patient Re-evaluated prior to inductionOxygen Delivery Method: Simple face mask Placement Confirmation: positive ETCO2 and CO2 detector

## 2011-07-30 ENCOUNTER — Encounter (INDEPENDENT_AMBULATORY_CARE_PROVIDER_SITE_OTHER): Payer: Self-pay | Admitting: General Surgery

## 2011-07-30 ENCOUNTER — Ambulatory Visit (INDEPENDENT_AMBULATORY_CARE_PROVIDER_SITE_OTHER): Payer: BC Managed Care – PPO | Admitting: General Surgery

## 2011-07-30 VITALS — BP 122/84 | HR 98 | Temp 97.6°F | Resp 16 | Ht 63.0 in | Wt 200.4 lb

## 2011-07-30 DIAGNOSIS — Z09 Encounter for follow-up examination after completed treatment for conditions other than malignant neoplasm: Secondary | ICD-10-CM

## 2011-07-30 NOTE — Progress Notes (Signed)
Subjective:     Patient ID: Debra Combs, female   DOB: [birth year], 46 y.o.   MRN: [id number]  HPI This is a 46 year old female who had a left breast sebaceous cyst excised. She had been doing well until she began smelling an odor over her incision. She's had no drainage or any fevers at all.  Review of Systems     Objective:   Physical Exam Healed incision without infection, her dressing with steris was still on and was foul smelling    Assessment:     S/p sebaceous cyst excision    Plan:     I removed dressing and I think that is what was foul smelling.  Her wound was clean without infection.   I told her she could begin washing the area in shower with water and to keep appt with Dr. Lindie Spruce for next week.

## 2011-08-07 ENCOUNTER — Encounter (INDEPENDENT_AMBULATORY_CARE_PROVIDER_SITE_OTHER): Payer: Self-pay | Admitting: General Surgery

## 2011-08-07 ENCOUNTER — Encounter (INDEPENDENT_AMBULATORY_CARE_PROVIDER_SITE_OTHER): Payer: Self-pay

## 2011-08-07 ENCOUNTER — Ambulatory Visit (INDEPENDENT_AMBULATORY_CARE_PROVIDER_SITE_OTHER): Payer: BC Managed Care – PPO | Admitting: General Surgery

## 2011-08-07 VITALS — BP 126/84 | Temp 97.5°F | Ht 63.0 in | Wt 194.6 lb

## 2011-08-07 DIAGNOSIS — Z09 Encounter for follow-up examination after completed treatment for conditions other than malignant neoplasm: Secondary | ICD-10-CM

## 2011-08-07 NOTE — Progress Notes (Signed)
HPI The patient was doing well status post excision of left medial breast sebaceous cyst  PE The wound has healed well with no evidence of infection. It is mildly distended but not erythematous or swollen  Studiy review None  Assessment Status post excision of left medial breast sebaceous cyst and doing well  She is to return to see me on a p.r.n. basis.  Plan [] 

## 2011-09-07 ENCOUNTER — Encounter: Payer: Self-pay | Admitting: Gynecology

## 2011-09-07 ENCOUNTER — Ambulatory Visit (INDEPENDENT_AMBULATORY_CARE_PROVIDER_SITE_OTHER): Payer: BC Managed Care – PPO | Admitting: Gynecology

## 2011-09-07 VITALS — BP 120/76 | Ht 63.0 in | Wt 192.0 lb

## 2011-09-07 DIAGNOSIS — Z01419 Encounter for gynecological examination (general) (routine) without abnormal findings: Secondary | ICD-10-CM

## 2011-09-07 NOTE — Patient Instructions (Signed)
Follow up in a year for annual gynecologic exam

## 2011-09-07 NOTE — Progress Notes (Signed)
Debra Combs 10/04/1965 [id number]        46 y.o.  for annual exam.  Doing well. Had 2 separate menses this past month otherwise been having regular monthly light menses.  Past medical history,surgical history, medications, allergies, family history and social history were all reviewed and documented in the EPIC chart. ROS:  Was performed and pertinent positives and negatives are included in the history.  Exam: Kim chaperone present Filed Vitals:   09/07/11 0913  BP: 120/76   General appearance  Normal Skin grossly normal Head/Neck normal with no cervical or supraclavicular adenopathy thyroid normal Lungs  clear Cardiac RR, without RMG Abdominal  soft, nontender, without masses, organomegaly or hernia Breasts  examined lying and sitting without masses, retractions, discharge or axillary adenopathy.  Healing scar left junction of breast and chest wall at 7:00 position. Pelvic  Ext/BUS/vagina  normal   Cervix  normal    Uterus  anteverted, normal size, shape and contour, midline and mobile nontender   Adnexa  Without masses or tenderness    Anus and perineum  normal   Rectovaginal  normal sphincter tone without palpated masses or tenderness.    Assessment/Plan:  46 y.o. female for annual exam.    1. Menstrual regularity x1 cycle. Otherwise has regular light menses status post HerOption endometrial ablation. Recommend keep menstrual calendar as long as regular them we'll follow if irregularity continues she knows to call for a further evaluation. 2. Pap smear. No Pap smear was done today. Last Pap smear 2012. She does have a history of cryosurgery 20 years ago historically and we do not have records from this. Her Pap smears have all been normal since then. We'll plan every 3 year screening per current screening guidelines and she agrees. 3. Mammography. Patient had her mammogram in October and will follow up this coming fall continuing annual mammography. Recently had sebaceous cyst  excised from her left chest wall and has done well from this.  SBE monthly reviewed. 4. Contraception. Patient using vasectomy. 5. Health maintenance. No blood work was done today as is all done through her primary physician's office who she actively follows up with. Assuming she continues well from a gynecologic standpoint and she will see [place] in a year, sooner as needed.    [name] MD, 9:50 AM 09/07/2011

## 2012-04-08 ENCOUNTER — Encounter: Payer: Self-pay | Admitting: Gynecology

## 2012-05-06 ENCOUNTER — Encounter (INDEPENDENT_AMBULATORY_CARE_PROVIDER_SITE_OTHER): Payer: Self-pay | Admitting: General Surgery

## 2012-05-06 ENCOUNTER — Ambulatory Visit (INDEPENDENT_AMBULATORY_CARE_PROVIDER_SITE_OTHER): Payer: BC Managed Care – PPO | Admitting: General Surgery

## 2012-05-06 VITALS — BP 132/86 | HR 84 | Temp 97.9°F | Resp 12 | Ht 63.0 in | Wt 193.8 lb

## 2012-05-06 DIAGNOSIS — R52 Pain, unspecified: Secondary | ICD-10-CM | POA: Insufficient documentation

## 2012-05-06 DIAGNOSIS — L905 Scar conditions and fibrosis of skin: Secondary | ICD-10-CM | POA: Insufficient documentation

## 2012-05-06 NOTE — Progress Notes (Signed)
The patient comes in with a complaint of a raised area in the inferior aspect of her incision on the inferior medial aspect of her left breast.  There is no surrounding erythema. The raised area in her incision is approximately 3 mm in size. It is not fluctuant but it has sort of old white color to it. It is firm and tender. This likely represents some type of subcutaneous reaction to suture material in that area.  Because it does not appear to be infected I do not think that incision and drainage would be necessary at this time however I would like to see her again in 3 months to reassess the area. I told her that reexcision is a possibility in the future. We would like to treat this conservatively with observation and reevaluation in 3 months. I recommended trying to keep clothing especially brassieres off that area which might irritate it further. Also invited her to return to see me sooner if there should be any drainage or spreading erythema in that area.

## 2012-06-23 ENCOUNTER — Ambulatory Visit (INDEPENDENT_AMBULATORY_CARE_PROVIDER_SITE_OTHER): Payer: BC Managed Care – PPO | Admitting: Women's Health

## 2012-06-23 ENCOUNTER — Encounter: Payer: Self-pay | Admitting: Women's Health

## 2012-06-23 DIAGNOSIS — N898 Other specified noninflammatory disorders of vagina: Secondary | ICD-10-CM

## 2012-06-23 DIAGNOSIS — R3915 Urgency of urination: Secondary | ICD-10-CM

## 2012-06-23 LAB — URINALYSIS W MICROSCOPIC + REFLEX CULTURE
Bilirubin Urine: NEGATIVE
Ketones, ur: NEGATIVE mg/dL
Protein, ur: NEGATIVE mg/dL
Urobilinogen, UA: 0.2 mg/dL (ref 0.0–1.0)

## 2012-06-23 MED ORDER — FLUCONAZOLE 150 MG PO TABS: 150.0000 mg | ORAL_TABLET | Freq: Once | ORAL | Status: DC

## 2012-06-23 MED ORDER — CEPHALEXIN 500 MG PO CAPS: 500.0000 mg | ORAL_CAPSULE | Freq: Three times a day (TID) | ORAL | Status: DC

## 2012-06-23 NOTE — Progress Notes (Signed)
Patient ID: Debra Combs, female   DOB: 12-12-1965, 47 y.o.   MRN: [id number] Had increased urinary pain, burning, frequency and urgency and called on call Dr./Dr. Lomax 06/21/12 to prescribe Cipro 500 twice daily for 3 days. Has taken for 2 days but continues with symptoms. Also questions if had a retained tampon, denies vaginal itching, odor or unusual bleeding. History of an ablation. Also had a inclusion cyst removed at sternum, inner breast last year that recently opened and drained a white yellow material yesterday. Denies a fever. Numerous allergies including Septra and Macrobid  Exam: No CVAT, abdomen soft, no rebound. External genitalia within normal limits, speculum exam scant discharge cervix healthy, wet prep negative. UA negative. Bimanual no CMT or adnexal fullness or tenderness. Small open erythematous area at left breast cleavage, tender to touch. Breasts pendulous.  UTI symptoms Skin infection at sternum/left inner breast  Plan: Encouraged to keep breast area clean and dry, Keflex 500, 3 times a day for 7 days. Stop Cipro. Urine culture pending. Instructed to call if symptoms persist.

## 2012-06-23 NOTE — Patient Instructions (Addendum)
Folliculitis   Folliculitis is redness, soreness, and swelling (inflammation) of the hair follicles. This condition can occur anywhere on the body. People with weakened immune systems, diabetes, or obesity have a greater risk of getting folliculitis.  CAUSES   Bacterial infection. This is the most common cause.   Fungal infection.   Viral infection.   Contact with certain chemicals, especially oils and tars.  Long-term folliculitis can result from bacteria that live in the nostrils. The bacteria may trigger multiple outbreaks of folliculitis over time.  SYMPTOMS  Folliculitis most commonly occurs on the scalp, thighs, legs, back, buttocks, and areas where hair is shaved frequently. An early sign of folliculitis is a small, white or yellow, pus-filled, itchy lesion (pustule). These lesions appear on a red, inflamed follicle. They are usually less than 0.2 inches (5 mm) wide. When there is an infection of the follicle that goes deeper, it becomes a boil or furuncle. A group of closely packed boils creates a larger lesion (carbuncle). Carbuncles tend to occur in hairy, sweaty areas of the body.  DIAGNOSIS   Your caregiver can usually tell what is wrong by doing a physical exam. A sample may be taken from one of the lesions and tested in a lab. This can help determine what is causing your folliculitis.  TREATMENT   Treatment may include:   Applying warm compresses to the affected areas.   Taking antibiotic medicines orally or applying them to the skin.   Draining the lesions if they contain a large amount of pus or fluid.   Laser hair removal for cases of long-lasting folliculitis. This helps to prevent regrowth of the hair.  HOME CARE INSTRUCTIONS   Apply warm compresses to the affected areas as directed by your caregiver.   If antibiotics are prescribed, take them as directed. Finish them even if you start to feel better.   You may take over-the-counter medicines to relieve itching.   Do not shave  irritated skin.   Follow up with your caregiver as directed.  SEEK IMMEDIATE MEDICAL CARE IF:    You have increasing redness, swelling, or pain in the affected area.   You have a fever.  MAKE SURE YOU:   Understand these instructions.   Will watch your condition.   Will get help right away if you are not doing well or get worse.  Document Released: 07/23/2001 Document Revised: 11/13/2011 Document Reviewed: 08/14/2011  [employer] Patient Information 2013 [employer].

## 2012-06-24 LAB — URINE CULTURE: Colony Count: 4000

## 2012-06-25 ENCOUNTER — Encounter (INDEPENDENT_AMBULATORY_CARE_PROVIDER_SITE_OTHER): Payer: BC Managed Care – PPO | Admitting: General Surgery

## 2012-07-01 ENCOUNTER — Encounter (INDEPENDENT_AMBULATORY_CARE_PROVIDER_SITE_OTHER): Payer: BC Managed Care – PPO | Admitting: General Surgery

## 2012-07-12 ENCOUNTER — Other Ambulatory Visit: Payer: Self-pay

## 2012-07-29 ENCOUNTER — Encounter (INDEPENDENT_AMBULATORY_CARE_PROVIDER_SITE_OTHER): Payer: BC Managed Care – PPO | Admitting: General Surgery

## 2012-08-12 ENCOUNTER — Encounter (INDEPENDENT_AMBULATORY_CARE_PROVIDER_SITE_OTHER): Payer: BC Managed Care – PPO | Admitting: General Surgery

## 2012-08-28 ENCOUNTER — Ambulatory Visit (INDEPENDENT_AMBULATORY_CARE_PROVIDER_SITE_OTHER): Payer: BC Managed Care – PPO | Admitting: General Surgery

## 2012-08-28 VITALS — BP 126/78 | HR 87 | Temp 98.2°F | Resp 18 | Ht 63.0 in | Wt 194.0 lb

## 2012-08-28 DIAGNOSIS — T8189XA Other complications of procedures, not elsewhere classified, initial encounter: Secondary | ICD-10-CM | POA: Insufficient documentation

## 2012-08-28 DIAGNOSIS — T8189XD Other complications of procedures, not elsewhere classified, subsequent encounter: Secondary | ICD-10-CM

## 2012-08-28 NOTE — Progress Notes (Signed)
The patient comes in today with continued complaints of discomfort on the medial aspect of her left breast. In that area there is still a small opening at the inferior portion of the wound it appears to be gathering and no bacteria to give it a foul-smelling.  The patient is now worried about this being dangerous but it is an annoyance and it is still tender. I've recommended macular care with cleansing without peroxide and keeping it covered to keep the irritation from the inner portion of her brassiere from causing it to swell and give more indurated. I will see her back in 2 months and if it is not improved by that time we will re-excise the area.

## 2012-09-09 ENCOUNTER — Encounter: Payer: Self-pay | Admitting: Gynecology

## 2012-09-09 ENCOUNTER — Ambulatory Visit (INDEPENDENT_AMBULATORY_CARE_PROVIDER_SITE_OTHER): Payer: BC Managed Care – PPO | Admitting: Gynecology

## 2012-09-09 VITALS — BP 120/74 | Ht 63.0 in | Wt 192.0 lb

## 2012-09-09 DIAGNOSIS — Z01419 Encounter for gynecological examination (general) (routine) without abnormal findings: Secondary | ICD-10-CM

## 2012-09-09 DIAGNOSIS — D259 Leiomyoma of uterus, unspecified: Secondary | ICD-10-CM

## 2012-09-09 NOTE — Patient Instructions (Signed)
Follow up for ultrasound as scheduled 

## 2012-09-09 NOTE — Progress Notes (Signed)
Debra Combs 01/13/1966 [id number]        47 y.o.  [id number] for annual exam.  Doing well without complaints.  Past medical history,surgical history, medications, allergies, family history and social history were all reviewed and documented in the EPIC chart. ROS:  Was performed and pertinent positives and negatives are included in the history.  Exam: Kim assistant Filed Vitals:   09/09/12 0916  BP: 120/74  Height: 5\' 3"  (1.6 m)  Weight: 192 lb (87.091 kg)   General appearance  Normal Skin grossly normal Head/Neck normal with no cervical or supraclavicular adenopathy thyroid normal Lungs  clear Cardiac RR, without RMG Abdominal  soft, nontender, without masses, organomegaly or hernia Breasts  examined lying and sitting without masses, retractions, discharge or axillary adenopathy. Pelvic  Ext/BUS/vagina  normal   Cervix  normal   Uterus  generous in size, midline and mobile nontender   Adnexa  Without masses or tenderness    Anus and perineum  normal   Rectovaginal  normal sphincter tone without palpated masses or tenderness.    Assessment/Plan:  47 y.o. [id number] female for annual exam, regular menses, vasectomy birth control.   1. Generous uterus on exam. Questionable myomas. 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 Will followup with ultrasound now for better definition/rule out ovarian process. Menses otherwise regular and acceptable. 2. Mammography 03/2012. Continue with annual mammography. SBE monthly reviewed. 3. Pap smear 08/2010. No Pap smear done today. History of cryosurgery 1997 with normal Pap smears since then. Plan repeat Pap smear next year at 3 year interval. 4. Health maintenance. No lab work done as it is all done through her primary physician's office who she sees on a regular basis. Followup ultrasound.    Debra Lords MD, 9:46 AM 09/09/2012

## 2012-10-01 ENCOUNTER — Ambulatory Visit (INDEPENDENT_AMBULATORY_CARE_PROVIDER_SITE_OTHER): Payer: BC Managed Care – PPO | Admitting: Gynecology

## 2012-10-01 ENCOUNTER — Ambulatory Visit (INDEPENDENT_AMBULATORY_CARE_PROVIDER_SITE_OTHER): Payer: BC Managed Care – PPO

## 2012-10-01 ENCOUNTER — Encounter: Payer: Self-pay | Admitting: Gynecology

## 2012-10-01 DIAGNOSIS — D259 Leiomyoma of uterus, unspecified: Secondary | ICD-10-CM

## 2012-10-01 DIAGNOSIS — N852 Hypertrophy of uterus: Secondary | ICD-10-CM

## 2012-10-01 DIAGNOSIS — N8300 Follicular cyst of ovary, unspecified side: Secondary | ICD-10-CM

## 2012-10-01 DIAGNOSIS — D251 Intramural leiomyoma of uterus: Secondary | ICD-10-CM

## 2012-10-01 NOTE — Patient Instructions (Signed)
Follow up in one year for annual exam 

## 2012-10-01 NOTE — Progress Notes (Signed)
Patient presents for ultrasound. Exam at her annual showed uterus to be generous in size with past history of small myomas wanted to make sure that everything else was normal.  Ultrasound shows uterus overall normal in echotexture with small 26 mm fundal myoma. Uterus is generous in size at 105 mm length. Endometrial echo 6.5 mm. Right and left ovaries visualized and normal. Cul-de-sac negative.  Assessment and plan: Generous uterus size think is physiologic noting 2 full-term pregnancies. Does have small fundal myoma. Patient's asymptomatic. We'll continue to monitor and followup for her annual exam in one year.

## 2012-10-21 ENCOUNTER — Encounter (INDEPENDENT_AMBULATORY_CARE_PROVIDER_SITE_OTHER): Payer: BC Managed Care – PPO | Admitting: General Surgery

## 2012-10-29 ENCOUNTER — Encounter (INDEPENDENT_AMBULATORY_CARE_PROVIDER_SITE_OTHER): Payer: Self-pay | Admitting: General Surgery

## 2012-11-27 ENCOUNTER — Ambulatory Visit (INDEPENDENT_AMBULATORY_CARE_PROVIDER_SITE_OTHER): Payer: BC Managed Care – PPO | Admitting: Gynecology

## 2012-11-27 ENCOUNTER — Encounter: Payer: Self-pay | Admitting: Gynecology

## 2012-11-27 DIAGNOSIS — L293 Anogenital pruritus, unspecified: Secondary | ICD-10-CM

## 2012-11-27 DIAGNOSIS — N898 Other specified noninflammatory disorders of vagina: Secondary | ICD-10-CM

## 2012-11-27 LAB — WET PREP FOR TRICH, YEAST, CLUE
Clue Cells Wet Prep HPF POC: NONE SEEN
Trich, Wet Prep: NONE SEEN
Yeast Wet Prep HPF POC: NONE SEEN

## 2012-11-27 MED ORDER — FLUCONAZOLE 200 MG PO TABS: 200.0000 mg | ORAL_TABLET | Freq: Every day | ORAL | Status: DC

## 2012-11-27 NOTE — Patient Instructions (Addendum)
Take diflucan pill one day then take 2nd pill 2 days later

## 2012-11-27 NOTE — Progress Notes (Signed)
Patient presents with several weeks of vulvar pruritus. No real discharge. Has been recently treated with oral antibiotics.  Exam was Administrator, [employer] vagina with thick cakey white discharge. Cervix normal. Uterus generous in size midline mobile nontender. Adnexa without masses or tenderness.  Assessment and plan: Clinical history and exam consistent with yeast. Wet prep was negative. Covering her with Diflucan 200 mg every other day x2 doses. Followup if symptoms persist or recur.

## 2013-02-06 ENCOUNTER — Encounter: Payer: Self-pay | Admitting: Women's Health

## 2013-02-06 ENCOUNTER — Ambulatory Visit (INDEPENDENT_AMBULATORY_CARE_PROVIDER_SITE_OTHER): Payer: BC Managed Care – PPO | Admitting: Women's Health

## 2013-02-06 DIAGNOSIS — N898 Other specified noninflammatory disorders of vagina: Secondary | ICD-10-CM

## 2013-02-06 MED ORDER — NYSTATIN 100000 UNIT/GM EX CREA: TOPICAL_CREAM | Freq: Two times a day (BID) | CUTANEOUS | Status: DC

## 2013-02-06 MED ORDER — NYSTATIN-TRIAMCINOLONE 100000-0.1 UNIT/GM-% EX OINT: TOPICAL_OINTMENT | Freq: Two times a day (BID) | CUTANEOUS | Status: DC

## 2013-02-06 NOTE — Progress Notes (Signed)
Patient ID: Debra Combs, female   DOB: 1966-04-07, 47 y.o.   MRN: [id number] Resents with complaint of peeling skin  on the perineum. States treated with Diflucan for itching but continues to have mild itching and noting brown skin flecks on toilet tissue. Had an ablation  has light cycles. Denies abdominal pain, fever, urinary symptoms.  Exam: Appears well, external genitalia slightly erythematous labia majora with peeling skin noted. Speculum exam vaginal walls healthy with no noted erythema or discharge. Wet prep negative. Bimanual no CMT or adnexal fullness or tenderness. Uterus 8 weeks' size/fibroids.  Erythematous and inflamed labia majora  Plan: Mycolog ointment twice daily to external genitalia. Instructed to call if no relief of symptoms. Loose clothes.

## 2013-04-02 ENCOUNTER — Other Ambulatory Visit: Payer: Self-pay

## 2013-04-10 ENCOUNTER — Encounter: Payer: Self-pay | Admitting: Gynecology

## 2014-02-26 ENCOUNTER — Other Ambulatory Visit: Payer: Self-pay | Admitting: Gastroenterology

## 2014-02-26 DIAGNOSIS — R1310 Dysphagia, unspecified: Secondary | ICD-10-CM

## 2014-03-12 ENCOUNTER — Other Ambulatory Visit: Payer: BC Managed Care – PPO

## 2014-03-19 ENCOUNTER — Ambulatory Visit
Admission: RE | Admit: 2014-03-19 | Discharge: 2014-03-19 | Disposition: A | Discharge status: Home / Self Care | Payer: BC Managed Care – PPO | Source: Ambulatory Visit | Attending: Gastroenterology | Admitting: Gastroenterology

## 2014-03-19 DIAGNOSIS — R1310 Dysphagia, unspecified: Secondary | ICD-10-CM

## 2014-03-29 ENCOUNTER — Encounter: Payer: Self-pay | Admitting: Women's Health

## 2014-04-15 ENCOUNTER — Encounter: Payer: Self-pay | Admitting: Gynecology

## 2014-06-09 ENCOUNTER — Other Ambulatory Visit: Payer: Self-pay | Admitting: Otolaryngology

## 2014-06-09 DIAGNOSIS — R42 Dizziness and giddiness: Secondary | ICD-10-CM

## 2014-06-09 DIAGNOSIS — R519 Headache, unspecified: Secondary | ICD-10-CM

## 2014-06-09 DIAGNOSIS — R51 Headache: Secondary | ICD-10-CM

## 2014-06-18 ENCOUNTER — Other Ambulatory Visit: Payer: Self-pay

## 2014-06-25 ENCOUNTER — Other Ambulatory Visit (HOSPITAL_COMMUNITY)
Admission: RE | Admit: 2014-06-25 | Discharge: 2014-06-25 | Disposition: A | Discharge status: Home / Self Care | Payer: BLUE CROSS/BLUE SHIELD | Source: Ambulatory Visit | Attending: Women's Health | Admitting: Women's Health

## 2014-06-25 ENCOUNTER — Ambulatory Visit (INDEPENDENT_AMBULATORY_CARE_PROVIDER_SITE_OTHER): Payer: BLUE CROSS/BLUE SHIELD | Admitting: Women's Health

## 2014-06-25 ENCOUNTER — Encounter: Payer: Self-pay | Admitting: Women's Health

## 2014-06-25 VITALS — BP 122/80 | Ht 63.0 in | Wt 195.0 lb

## 2014-06-25 DIAGNOSIS — Z01419 Encounter for gynecological examination (general) (routine) without abnormal findings: Secondary | ICD-10-CM

## 2014-06-25 DIAGNOSIS — Z9889 Other specified postprocedural states: Secondary | ICD-10-CM | POA: Insufficient documentation

## 2014-06-25 DIAGNOSIS — Z1151 Encounter for screening for human papillomavirus (HPV): Secondary | ICD-10-CM | POA: Diagnosis present

## 2014-06-25 NOTE — Progress Notes (Addendum)
Debra Combs [birth year] [id number]    History:    Presents for annual exam.  Regular monthly cycle/vasectomy. Ablation 2007 or 08 had minimal cycles for several years, now cycles have become more regular. History of 3 cm fibroid. 1997 cryo-with normal Paps after. Labs at primary care. Normal mammogram history.  Past medical history, past surgical history, family history and social history were all reviewed and documented in the EPIC chart. Works from home, recently started regular exercise program 3-4 days weekly. Daughter 7, son 31 both doing well.  ROS:  A ROS was performed and pertinent positives and negatives are included.  Exam:  Filed Vitals:   06/25/14 0921  BP: 122/80    General appearance:  Normal Thyroid:  Symmetrical, normal in size, without palpable masses or nodularity. Respiratory  Auscultation:  Clear without wheezing or rhonchi Cardiovascular  Auscultation:  Regular rate, without rubs, murmurs or gallops  Edema/varicosities:  Not grossly evident Abdominal  Soft,nontender, without masses, guarding or rebound.  Liver/spleen:  No organomegaly noted  Hernia:  None appreciated  Skin  Inspection:  Grossly normal   Breasts: Examined lying and sitting.     Right: Without masses, retractions, discharge or axillary adenopathy.     Left: Inner lower quadrant sebaceous cyst removed continues to be uncomfortable. No palpable nodules, dimpling, retractions. Gentitourinary   Inguinal/mons:  Normal without inguinal adenopathy  External genitalia:  Normal  BUS/Urethra/Skene's glands:  Normal  Vagina:  Normal  Cervix:  Normal  Uterus:  8 weeks' size , shape and contour.  Midline and mobile  Adnexa/parametria:     Rt: Without masses or tenderness.   Lt: Without masses or tenderness.  Anus and perineum: Normal  Digital rectal exam: Normal sphincter tone without palpated masses or tenderness  Assessment/Plan:  49 y.o. MBF G6 P2 for annual exam.   Monthly cycles/fibroid  uterus/vasectomy/ablation 2007 1997 cryo-normal Paps after Obesity Labs-primary care  Plan: SBE's, continue annual screening 3-D mammogram history of dense breast. Continue regular exercise, calcium rich diet, vitamin D 1000 daily encouraged. Pap with HR HPV typing, new screening guidelines reviewed.  Reports ablation per Dr. Phineas Real in either 2007 or 08 unable to find any records will obtain old chart 03/24/2007 cryoablation per Dr. Phineas Real report found.  Debra Combs [facility], 10:39 AM 06/25/2014

## 2014-06-26 ENCOUNTER — Other Ambulatory Visit: Payer: Self-pay

## 2014-06-26 LAB — URINALYSIS W MICROSCOPIC + REFLEX CULTURE
BILIRUBIN URINE: NEGATIVE
Casts: NONE SEEN
Crystals: NONE SEEN
GLUCOSE, UA: NEGATIVE mg/dL
HGB URINE DIPSTICK: NEGATIVE
Ketones, ur: NEGATIVE mg/dL
LEUKOCYTES UA: NEGATIVE
NITRITE: NEGATIVE
PROTEIN: NEGATIVE mg/dL
Specific Gravity, Urine: 1.015 (ref 1.005–1.030)
UROBILINOGEN UA: 0.2 mg/dL (ref 0.0–1.0)
pH: 6.5 (ref 5.0–8.0)

## 2014-06-27 ENCOUNTER — Ambulatory Visit
Admission: RE | Admit: 2014-06-27 | Discharge: 2014-06-27 | Disposition: A | Discharge status: Home / Self Care | Payer: BLUE CROSS/BLUE SHIELD | Source: Ambulatory Visit | Attending: Otolaryngology | Admitting: Otolaryngology

## 2014-06-27 DIAGNOSIS — R42 Dizziness and giddiness: Secondary | ICD-10-CM

## 2014-06-27 DIAGNOSIS — R51 Headache: Secondary | ICD-10-CM

## 2014-06-27 DIAGNOSIS — R519 Headache, unspecified: Secondary | ICD-10-CM

## 2014-06-27 MED ORDER — GADOBENATE DIMEGLUMINE 529 MG/ML IV SOLN
18.0000 mL | Freq: Once | INTRAVENOUS | Status: AC | PRN
  Administered 2014-06-27: 18 mL via INTRAVENOUS

## 2014-06-28 LAB — CYTOLOGY - PAP

## 2014-06-29 ENCOUNTER — Other Ambulatory Visit: Payer: Self-pay | Admitting: Women's Health

## 2014-06-29 DIAGNOSIS — A499 Bacterial infection, unspecified: Secondary | ICD-10-CM

## 2014-06-29 DIAGNOSIS — N390 Urinary tract infection, site not specified: Principal | ICD-10-CM

## 2014-06-29 LAB — URINE CULTURE

## 2014-07-06 ENCOUNTER — Other Ambulatory Visit: Payer: BLUE CROSS/BLUE SHIELD

## 2014-07-06 DIAGNOSIS — A499 Bacterial infection, unspecified: Secondary | ICD-10-CM

## 2014-07-06 DIAGNOSIS — N390 Urinary tract infection, site not specified: Principal | ICD-10-CM

## 2014-07-07 LAB — URINALYSIS W MICROSCOPIC + REFLEX CULTURE
BILIRUBIN URINE: NEGATIVE
CASTS: NONE SEEN
Crystals: NONE SEEN
GLUCOSE, UA: NEGATIVE mg/dL
HGB URINE DIPSTICK: NEGATIVE
KETONES UR: NEGATIVE mg/dL
Leukocytes, UA: NEGATIVE
Nitrite: NEGATIVE
PROTEIN: NEGATIVE mg/dL
Specific Gravity, Urine: 1.005 (ref 1.005–1.030)
Squamous Epithelial / LPF: NONE SEEN
Urobilinogen, UA: 0.2 mg/dL (ref 0.0–1.0)
pH: 6.5 (ref 5.0–8.0)

## 2014-07-09 ENCOUNTER — Other Ambulatory Visit: Payer: Self-pay | Admitting: Women's Health

## 2014-07-09 MED ORDER — CIPROFLOXACIN HCL 250 MG PO TABS: 250.0000 mg | ORAL_TABLET | Freq: Two times a day (BID) | ORAL | Status: DC

## 2014-07-09 NOTE — Progress Notes (Signed)
She has lots of allergies the Avelox is a different formulation. Okay for Cipro 250 twice daily for 3 days

## 2014-07-10 LAB — URINE CULTURE: Colony Count: >=100000

## 2014-07-21 ENCOUNTER — Encounter: Payer: Self-pay | Admitting: Neurology

## 2014-07-21 ENCOUNTER — Ambulatory Visit (INDEPENDENT_AMBULATORY_CARE_PROVIDER_SITE_OTHER): Payer: BLUE CROSS/BLUE SHIELD | Admitting: Neurology

## 2014-07-21 VITALS — BP 124/73 | HR 79 | Resp 16 | Ht 63.0 in | Wt 198.0 lb

## 2014-07-21 DIAGNOSIS — G44309 Post-traumatic headache, unspecified, not intractable: Secondary | ICD-10-CM

## 2014-07-21 DIAGNOSIS — R51 Headache: Secondary | ICD-10-CM

## 2014-07-21 DIAGNOSIS — R519 Headache, unspecified: Secondary | ICD-10-CM | POA: Insufficient documentation

## 2014-07-21 DIAGNOSIS — R42 Dizziness and giddiness: Secondary | ICD-10-CM | POA: Insufficient documentation

## 2014-07-21 HISTORY — DX: Post-traumatic headache, unspecified, not intractable: G44.309

## 2014-07-21 MED ORDER — TOPIRAMATE 25 MG PO TABS: 25.0000 mg | ORAL_TABLET | Freq: Every day | ORAL | Status: DC

## 2014-07-21 NOTE — Progress Notes (Signed)
SLEEP MEDICINE CLINIC   Provider:  Larey Seat, M D  Referring Provider: Viona Combs , NP  Primary Care Physician:  Debra Pound, MD  Chief Complaint  Patient presents with  . Dizziness    Rm # 10 New Patient   . Headache    HPI:  Debra Combs is a 49 y.o. female ,seen here as a referral from Debra Combs for an evaluation of dizziness and Vertigo,   The patient reports that she presented first with symptoms of dizziness and vertigo to her primary care physician, who in turn referred her to a sleep study and to ENTs physician. Her sleep study was diagnostic for sleep apnea was performed 6 months ago at [facility] sleep services. She was placed on CPAP which he has used for the last 6 months approximately. Her dizziness did not resolve. Regular throat physician performed a [employer] maneuver and she said that initially she felt better the crystals seemed to have been repositioned but slowly and steadily the dizziness and vertigo feeling returned. No spontaneous nystagmus was observed the Dix-Hallpike maneuver was negative with head turning to either side and was supine and nonsupine position the roll test was negative with the head turned to either side. An MRI examination followed actually was a CT the CT of the head showed no abnormalities. It was performed without contrast agent it may be ruled out stroke or bleed. It noted that dizziness was present for 3 days straight at the time the patient underwent a CT scan. So audiology examination was normal ENT examination was otherwise physically negative:  l no enlarged lymph nodes were found,  intranasal exam was without any abnormalities , mouth /oropharynx was normal,  external ear and nose were normal.  Orthostatic blood pressures were obtained and showed a blood pressure standing 129/84 heart rate 77 sitting blood pressure 134/82 heart rate 70 bpm blood pressure in lying position 123/82 and heart rate 78 bpm.  2 weeks ago in early  February 2016 the patient's symptoms led to a referral to an MRI of the brain. She has trouble recalling the location and maintain of the radiology institute, it was [facility] imaging.  The patient fell in Dec 2014 and she feels that caused a different form of dizziness, less vertigo but more lightheadedness. The patient were diagnosed with a concussion, she did not lose awareness, has no amnesia.  She did not vomit. Headaches  Started 1-2 days later, left frontal , with migrainous Character. She went to the PCP 3 days later, feeling bad. He had problems with concentration, with sleep and with memory or multitasking for some time afterwards.  She describes a tenderness and localized headache that still affects her over the left eyebrow frontally. The headache character is dull and throbbing. It has a pulsating character as well not sharp not burning not numb. There is no pressure behind the eye or ear. She usually takes an Excedrin to abbreviate the spell but they can last all day if left untreated. Bright light bothers her a lot during a headache, loud sounds to also but not to the same degree. She has been severely nauseated with some of these migraines and sometimes had to vomit. This is clearly a migraine. She is not able to sleep her headaches off, 7 out of 10 in intensity most times. No menstrual relation.   Debra Combs has a twin sister without vertigo or headaches.   Screen describes that last year with a year  of a lot of work-related stress but this year has been going much better for her. She does not think that stress correlates to her current level of vertigo-dizziness.   Review of Systems: Out of a complete 14 system review, the patient complains of only the following symptoms, and all other reviewed systems are negative. Dizziness, non vertigo, lightheadedness, OSA on CPAP, followed by dr. Maxwell Caul.   Epworth score  13 , Fatigue severity score 30  , depression score 3, PHQ 9.     History   Social History  . Marital Status: Married    Spouse Name: Debra Combs  . Number of Children: 2  . Years of Education: college   Occupational History  .      [employer]    Social History Main Topics  . Smoking status: Never Smoker   . Smokeless tobacco: Never Used  . Alcohol Use: No  . Drug Use: No  . Sexual Activity: Yes    Birth Control/ Protection: Other-see comments     Comment: VASECTOMY, INTERCOUSE AGE 34, LESS THAN 5 SEXUAL PARTNERS   Other Topics Concern  . Not on file   Social History Narrative   Patient lives at home with her husband Debra Combs) and her two children.   Patient works full time from home for [employer].   Education college.   Right handed.   Caffeine 32 oz daily.    Family History  Problem Relation Age of Onset  . Diabetes Mother   . Hypertension Mother   . Esophageal cancer Mother   . Cancer Mother     esopphageal  . Kidney failure Mother   . Hypertension Maternal Grandmother   . Diabetes Maternal Grandmother   . Leukemia Maternal Grandmother   . Esophageal cancer Father   . Cancer Father     esophageal  . Kidney failure Maternal Aunt   . Diabetes Maternal Aunt   . Kidney failure Maternal Uncle   . Diabetes Maternal Uncle     Past Medical History  Diagnosis Date  . Asthma   . Acne   . Breast cyst     left breast  . Anxiety   . GERD (gastroesophageal reflux disease)   . Cervical dysplasia 1997  . Dizziness   . HA (headache)     Past Surgical History  Procedure Laterality Date  . Endometrial ablation  2008    [facility]  . Dilation and curettage of uterus    . Ankle surgery  1983    Left  . Breast cyst excision  06/08/11    left breast  . Gynecologic cryosurgery  1997    Current Outpatient Prescriptions  Medication Sig Dispense Refill  . ALBUTEROL IN Inhale into the lungs.      Marland Kitchen aspirin-acetaminophen-caffeine (EXCEDRIN MIGRAINE) 250-250-65 MG per tablet Take 1 tablet by mouth every 6 (six) hours as needed for  headache.    . ciprofloxacin (CIPRO) 250 MG tablet Take 1 tablet (250 mg total) by mouth 2 (two) times daily. 6 tablet 0  . Loratadine (CLARITIN PO) Take 1 tablet by mouth daily.     Marland Kitchen omeprazole (PRILOSEC) 20 MG capsule Take 20 mg by mouth daily.    Marland Kitchen spironolactone (ALDACTONE) 25 MG tablet Take 25 mg by mouth daily.      . VENTOLIN HFA 108 (90 BASE) MCG/ACT inhaler as directed.      No current facility-administered medications for this visit.    Allergies as of 07/21/2014 - Review  Complete 07/21/2014  Allergen Reaction Noted  . Avelox [moxifloxacin hcl in nacl]  05/09/2011  . Nitrofurantoin monohyd macro  12/13/2010  . Sulfa antibiotics  12/13/2010  . Zithromax [azithromycin dihydrate] Nausea And Vomiting 07/20/2011  . Shrimp [shellfish allergy] Rash 07/20/2011    Vitals: BP 124/73 mmHg  Pulse 79  Resp 16  Ht 5\' 3"  (1.6 m)  Wt 198 lb (89.812 kg)  BMI 35.08 kg/m2  LMP 06/12/2014 Last Weight:  Wt Readings from Last 1 Encounters:  07/21/14 198 lb (89.812 kg)       Last Height:   Ht Readings from Last 1 Encounters:  07/21/14 5\' 3"  (1.6 m)    We repeated orthostatic blood pressures here today my nurse took a sitting blood pressure of 124/73 associated with a heart rate of 79, a standing blood pressure of 116/79 with a heart rate of 80 and a supine blood pressure of 117/76 with a heart rate of 81.   vision was 20/30 in both eyes. Physical exam:  General: The patient is awake, alert and appears not in acute distress. The patient is well groomed. Head: Normocephalic, atraumatic. Neck is supple. Mallampati 2   neck circumference:. Nasal airflow unrestricted  TMJ is  Is not evident . Retrognathia is seen.  The patient describes pressure at the left zygomatic arch, sometimes this area feels numb.  Cardiovascular:  Regular rate and rhythm, without  murmurs or carotid bruit, and without distended neck veins. Respiratory: Lungs are clear to auscultation. Skin:  Without evidence of  edema, or rash Trunk: BMI is  elevated and patient  has normal posture.  Neurologic exam : The patient is awake and alert, oriented to place and time.   Memory subjective described as intact. There is a normal attention span & concentration ability. Speech is fluent without dysarthria, dysphonia or aphasia. Mood and affect are appropriate.  Cranial nerves: Pupils are equal and briskly reactive to light. Funduscopic exam without evidence of pallor or edema.  Extraocular movements  in vertical and horizontal planes intact and without nystagmus. Visual fields by finger perimetry are intact. Hearing to finger rub intact.  Facial sensation intact to fine touch. Facial motor strength is symmetric and tongue and uvula move midline. Shoulder shrug is symmetric .   Motor exam:  Normal tone ,muscle bulk and symmetric ,strength in all extremities.  Sensory:  Fine touch, pinprick and vibration were tested in all extremities. Proprioception is  normal.  Coordination: Rapid alternating movements in the fingers/hands is normal. Finger-to-nose maneuver  normal without evidence of ataxia, dysmetria or tremor.  Gait and station: Patient walks without assistive device and is able unassisted to climb up to the exam table. Strength within normal limits. Stance is stable and normal.  Tandem gait is unfragmented. Romberg testing is negative. Patient is able to stay stand on either leg for 10 sec. , she can turn with 3 steps, she is able to stand on toes and heels.  Deep tendon reflexes: in the  upper and lower extremities are symmetric and intact. Babinski maneuver response is downgoing.   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 A  concussion is a mild traumatic brain injury. The patient had vertigo spells prior to her fall and concussion in December  2014 but the dizziness pattern changed with a fall she no longer had the dizziness the feeling of spinning or spinning of room spinning around her in a clockwise direction she now had lightheadedness and a feeling of imbalance. This was also a subjective feeling. In addition she developed a headache she had never had before a frontal left throbbing headache with migrainous character as associated with photophobia, phonophobia nausea and sometimes vomiting. Excedrin Migraine helps her to abbreviate a migraine spell but she has almost daily recurrence now. 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 I will start this patient on a preventive medication and chills topiramate. Her blood pressure and heart rate of find the could also use a beta blocker but I think that topiramate will also help her with her weight loss goals. It is known to decrease the appetite for sweets and carbonated beverages. She has no history of kidney stones. We will start with 25 mg at night for 1 week I will then ask her to take 50 mg at night from thereon she will have to keep hydrating well I would like her to at least drink a half gallon of fluid a day. Regular exercise is recommended. I think that the migraine component being treated will positively affects the lightheadedness as well. The orthostatic signs were not objectively verified in our blood pressure and heart rate measures. I also reviewed the images with the patient of the recent MRI. The brain looks very normal and healthy.   CLINICAL DATA: Headache, dizziness, expressive aphasia. Numbness. Symptoms began after falling down steps in December 2014, with frontal head injury. EXAM: MRI HEAD WITHOUT AND WITH CONTRAST TECHNIQUE: Multiplanar, multiecho pulse  sequences of the brain and surrounding structures were obtained without and with intravenous contrast.  CONTRAST: 57mL MULTIHANCE GADOBENATE DIMEGLUMINE 529 MG/ML IV SOLN  COMPARISON: Head CT 12/13/2010 FINDINGS: The brain has normal appearance on all pulse sequences without evidence of malformation, atrophy, old or acute infarction, mass lesion, hemorrhage, hydrocephalus or extra-axial collection. No evidence of post traumatic gliosis. Cranial nerves appear normal. No pituitary mass. Incidental arachnoid herniation into the sella. No fluid in the sinuses, middle ears or mastoids. After contrast administration, no abnormal enhancement occurs.  IMPRESSION: Normal study. No cause of the described symptoms is identified.   Electronically Signed  By: Nelson Chimes M.D.  On: 06/27/2014 15:37CLINICAL DATA: Headache, dizziness, expressive aphasia. Numbness. Symptoms began after falling down steps in December 2014, with frontal head injury.   Visit duration was 45 minutes with 50% of our face-to-face time in this consultation related to the differential diagnosis,  explanation , answering questions, discussion of the images and lab results and physical examination results. 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  A transformed headache almost daily.  Plan is to start topiramate as a preventive medication should this fail the patient I would go to a beater blocker such as propranolol at night. This would be my plan B.   07/21/2014

## 2014-07-21 NOTE — Patient Instructions (Signed)
Topiramate tablets What is this medicine? TOPIRAMATE (toe PYRE a mate) is used to treat seizures in adults or children with epilepsy. It is also used for the prevention of migraine headaches. This medicine may be used for other purposes; ask your health care provider or pharmacist if you have questions. COMMON BRAND NAME(S): Topamax, Topiragen What should I tell my health care provider before I take this medicine? They need to know if you have any of these conditions: -bleeding disorders -cirrhosis of the liver or liver disease -diarrhea -glaucoma -kidney stones or kidney disease -low blood counts, like low white cell, platelet, or red cell counts -lung disease like asthma, obstructive pulmonary disease, emphysema -metabolic acidosis -on a ketogenic diet -schedule for surgery or a procedure -suicidal thoughts, plans, or attempt; a previous suicide attempt by you or a family member -an unusual or allergic reaction to topiramate, other medicines, foods, dyes, or preservatives -pregnant or trying to get pregnant -breast-feeding How should I use this medicine? Take this medicine by mouth with a glass of water. Follow the directions on the prescription label. Do not crush or chew. You may take this medicine with meals. Take your medicine at regular intervals. Do not take it more often than directed. Talk to your pediatrician regarding the use of this medicine in children. Special care may be needed. While this drug may be prescribed for children as young as 2 years of age for selected conditions, precautions do apply. Overdosage: If you think you have taken too much of this medicine contact a poison control center or emergency room at once. NOTE: This medicine is only for you. Do not share this medicine with others. What if I miss a dose? If you miss a dose, take it as soon as you can. If your next dose is to be taken in less than 6 hours, then do not take the missed dose. Take the next dose at  your regular time. Do not take double or extra doses. What may interact with this medicine? Do not take this medicine with any of the following medications: -probenecid This medicine may also interact with the following medications: -acetazolamide -alcohol -amitriptyline -aspirin and aspirin-like medicines -birth control pills -certain medicines for depression -certain medicines for seizures -certain medicines that treat or prevent blood clots like warfarin, enoxaparin, dalteparin, apixaban, dabigatran, and rivaroxaban -digoxin -hydrochlorothiazide -lithium -medicines for pain, sleep, or muscle relaxation -metformin -methazolamide -NSAIDS, medicines for pain and inflammation, like ibuprofen or naproxen -pioglitazone -risperidone This list may not describe all possible interactions. Give your health care provider a list of all the medicines, herbs, non-prescription drugs, or dietary supplements you use. Also tell them if you smoke, drink alcohol, or use illegal drugs. Some items may interact with your medicine. What should I watch for while using this medicine? Visit your doctor or health care professional for regular checks on your progress. Do not stop taking this medicine suddenly. This increases the risk of seizures if you are using this medicine to control epilepsy. Wear a medical identification bracelet or chain to say you have epilepsy or seizures, and carry a card that lists all your medicines. This medicine can decrease sweating and increase your body temperature. Watch for signs of deceased sweating or fever, especially in children. Avoid extreme heat, hot baths, and saunas. Be careful about exercising, especially in hot weather. Contact your health care provider right away if you notice a fever or decrease in sweating. You should drink plenty of fluids while taking this medicine.   If you have had kidney stones in the past, this will help to reduce your chances of forming kidney  stones. If you have stomach pain, with nausea or vomiting and yellowing of your eyes or skin, call your doctor immediately. You may get drowsy, dizzy, or have blurred vision. Do not drive, use machinery, or do anything that needs mental alertness until you know how this medicine affects you. To reduce dizziness, do not sit or stand up quickly, especially if you are an older patient. Alcohol can increase drowsiness and dizziness. Avoid alcoholic drinks. If you notice blurred vision, eye pain, or other eye problems, seek medical attention at once for an eye exam. The use of this medicine may increase the chance of suicidal thoughts or actions. Pay special attention to how you are responding while on this medicine. Any worsening of mood, or thoughts of suicide or dying should be reported to your health care professional right away. This medicine may increase the chance of developing metabolic acidosis. If left untreated, this can cause kidney stones, bone disease, or slowed growth in children. Symptoms include breathing fast, fatigue, loss of appetite, irregular heartbeat, or loss of consciousness. Call your doctor immediately if you experience any of these side effects. Also, tell your doctor about any surgery you plan on having while taking this medicine since this may increase your risk for metabolic acidosis. Birth control pills may not work properly while you are taking this medicine. Talk to your doctor about using an extra method of birth control. Women who become pregnant while using this medicine may enroll in the [place] Antiepileptic Drug Pregnancy Registry by calling 1-888-233-2334. This registry collects information about the safety of antiepileptic drug use during pregnancy. What side effects may I notice from receiving this medicine? Side effects that you should report to your doctor or health care professional as soon as possible: -allergic reactions like skin rash, itching or hives,  swelling of the face, lips, or tongue -decreased sweating and/or rise in body temperature -depression -difficulty breathing, fast or irregular breathing patterns -difficulty speaking -difficulty walking or controlling muscle movements -hearing impairment -redness, blistering, peeling or loosening of the skin, including inside the mouth -tingling, pain or numbness in the hands or feet -unusual bleeding or bruising -unusually weak or tired -worsening of mood, thoughts or actions of suicide or dying Side effects that usually do not require medical attention (report to your doctor or health care professional if they continue or are bothersome): -altered taste -back pain, joint or muscle aches and pains -diarrhea, or constipation -headache -loss of appetite -nausea -stomach upset, indigestion -tremors This list may not describe all possible side effects. Call your doctor for medical advice about side effects. You may report side effects to FDA at 1-800-FDA-1088. Where should I keep my medicine? Keep out of the reach of children. Store at room temperature between 15 and 30 degrees C (59 and 86 degrees F) in a tightly closed container. Protect from moisture. Throw away any unused medicine after the expiration date. NOTE: This sheet is a summary. It may not cover all possible information. If you have questions about this medicine, talk to your doctor, pharmacist, or health care provider.  2015, [employer]vier/[employer] Standard. (2013-05-18 23:17:57)  

## 2014-10-22 ENCOUNTER — Ambulatory Visit: Payer: BLUE CROSS/BLUE SHIELD | Admitting: Neurology

## 2014-11-18 ENCOUNTER — Encounter: Payer: Self-pay | Admitting: Neurology

## 2014-11-18 ENCOUNTER — Ambulatory Visit (INDEPENDENT_AMBULATORY_CARE_PROVIDER_SITE_OTHER): Payer: BLUE CROSS/BLUE SHIELD | Admitting: Neurology

## 2014-11-18 VITALS — BP 118/80 | HR 86 | Resp 20 | Ht 63.39 in | Wt 203.0 lb

## 2014-11-18 DIAGNOSIS — G43829 Menstrual migraine, not intractable, without status migrainosus: Secondary | ICD-10-CM | POA: Diagnosis not present

## 2014-11-18 DIAGNOSIS — F0781 Postconcussional syndrome: Secondary | ICD-10-CM

## 2014-11-18 DIAGNOSIS — N943 Premenstrual tension syndrome: Secondary | ICD-10-CM

## 2014-11-18 MED ORDER — TOPIRAMATE 25 MG PO TABS: 25.0000 mg | ORAL_TABLET | Freq: Every day | ORAL | Status: DC

## 2014-11-18 MED ORDER — FROVATRIPTAN SUCCINATE 2.5 MG PO TABS: 2.5000 mg | ORAL_TABLET | ORAL | Status: DC | PRN

## 2014-11-18 NOTE — Progress Notes (Signed)
SLEEP MEDICINE CLINIC   Provider:  Larey Seat, M D  Referring Provider: Viona Combs , NP  Primary Care Physician:  Debra Pound, MD  Chief Complaint  Patient presents with  . Follow-up    dizziness, rm 10, alone    HPI:  Debra Combs is a 49 y.o. female ,seen here as a referral from Dr. Orland Combs for an evaluation of dizziness and Vertigo,   The patient reports that she presented first with symptoms of dizziness and vertigo to her primary care physician, who in turn referred her to a sleep study and to ENTs physician. Her sleep study was diagnostic for sleep apnea was performed 6 months ago at [facility] sleep services. She was placed on CPAP ,  Her dizziness did not resolve. ENT physician performed a [employer] maneuver and she said that initially she felt better " the crystals seemed to have been repositioned"  but slowly and steadily the dizziness and vertigo feeling returned.  No spontaneous nystagmus was observed , the Dix-Hallpike maneuver was negative with head turning to either side , and in  supine and nonsupine position. An MRI examination , CT of the head showed no abnormalities. Orthostatic blood pressures were obtained and showed a blood pressure standing 129/84 heart rate 77 sitting blood pressure 134/82 heart rate 70 bpm blood pressure in lying position 123/82 and heart rate 78 bpm. 2 weeks ago in early February 2016 the patient's symptoms led to a referral to an MRI of the brain. She has trouble recalling the location and maintain of the radiology institute, it was [facility] imaging.  The patient fell in Dec 2014 and she feels that caused a different form of dizziness, less vertigo but more lightheadedness. The patient were diagnosed with a concussion, she did not lose awareness, has no amnesia. She did not vomit. Headaches  Started 1-2 days later, left frontal , with migrainous Character. She went to the PCP 3 days later, feeling bad. He had problems with  concentration, with sleep and with memory or multitasking for some time afterwards. She describes a tenderness and localized headache that still affects her over the left eyebrow frontally. The headache character is dull and throbbing. It has a pulsating character as well not sharp not burning not numb. There is no pressure behind the eye or ear. She usually takes an Excedrin to abbreviate the spell but they can last all day if left untreated. Bright light bothers her a lot during a headache, loud sounds to also but not to the same degree. She has been severely nauseated with some of these migraines and sometimes had to vomit. This is clearly a migraine. She is not able to sleep her headaches off, 7 out of 10 in intensity most times. No menstrual relation.   Debra Combs has a twin sister without vertigo or headaches. Screen describes that last year with a year of a lot of work-related stress but this year has been going much better for her. She does not think that stress correlates to her current level of vertigo-dizziness.  Review of Systems: Out of a complete 14 system review, the patient complains of only the following symptoms, and all other reviewed systems are negative. Dizziness, non vertigo, lightheadedness, OSA on CPAP, followed by Dr. Maxwell Combs.     History   Social History  . Marital Status: Married    Spouse Name: Debra Combs  . Number of Children: 2  . Years of Education: college   Occupational History  .  [employer]    Social History Main Topics  . Smoking status: Never Smoker   . Smokeless tobacco: Never Used  . Alcohol Use: No  . Drug Use: No  . Sexual Activity: Yes    Birth Control/ Protection: Other-see comments     Comment: VASECTOMY, INTERCOUSE AGE 53, LESS THAN 5 SEXUAL PARTNERS   Other Topics Concern  . Not on file   Social History Narrative   Patient lives at home with her husband Debra Combs) and her two children.   Patient works full time from home for [employer].    Education college.   Right handed.   Caffeine 32 oz daily.    Family History  Problem Relation Age of Onset  . Diabetes Mother   . Hypertension Mother   . Esophageal cancer Mother   . Cancer Mother     esopphageal  . Kidney failure Mother   . Hypertension Maternal Grandmother   . Diabetes Maternal Grandmother   . Leukemia Maternal Grandmother   . Esophageal cancer Father   . Cancer Father     esophageal  . Kidney failure Maternal Aunt   . Diabetes Maternal Aunt   . Kidney failure Maternal Uncle   . Diabetes Maternal Uncle     Past Medical History  Diagnosis Date  . Asthma   . Acne   . Breast cyst     left breast  . Anxiety   . GERD (gastroesophageal reflux disease)   . Cervical dysplasia 1997  . Dizziness   . HA (headache)   . Post-concussion headache 07/21/2014    Past Surgical History  Procedure Laterality Date  . Endometrial ablation  2008    Her Option  . Dilation and curettage of uterus    . Ankle surgery  1983    Left  . Breast cyst excision  06/08/11    left breast  . Gynecologic cryosurgery  1997    Current Outpatient Prescriptions  Medication Sig Dispense Refill  . ALBUTEROL IN Inhale into the lungs.      Marland Kitchen aspirin-acetaminophen-caffeine (EXCEDRIN MIGRAINE) 250-250-65 MG per tablet Take 1 tablet by mouth every 6 (six) hours as needed for headache.    . ciprofloxacin (CIPRO) 250 MG tablet Take 1 tablet (250 mg total) by mouth 2 (two) times daily. 6 tablet 0  . Loratadine (CLARITIN PO) Take 1 tablet by mouth daily.     Marland Kitchen omeprazole (PRILOSEC) 20 MG capsule Take 20 mg by mouth daily.    Marland Kitchen QVAR 80 MCG/ACT inhaler     . spironolactone (ALDACTONE) 25 MG tablet Take 25 mg by mouth daily.      Marland Kitchen topiramate (TOPAMAX) 25 MG tablet Take 1 tablet (25 mg total) by mouth daily. 120 tablet 3  . tretinoin (RETIN-A) 0.025 % cream     . VENTOLIN HFA 108 (90 BASE) MCG/ACT inhaler as directed.      No current facility-administered medications for this visit.     Allergies as of 11/18/2014 - Review Complete 11/18/2014  Allergen Reaction Noted  . Avelox [moxifloxacin hcl in nacl]  05/09/2011  . Nitrofurantoin monohyd macro  12/13/2010  . Sulfa antibiotics  12/13/2010  . Zithromax [azithromycin dihydrate] Nausea And Vomiting 07/20/2011  . Shrimp [shellfish allergy] Rash 07/20/2011    Vitals: BP 118/80 mmHg  Pulse 86  Resp 20  Ht 5' 3.39" (1.61 m)  Wt 203 lb (92.08 kg)  BMI 35.52 kg/m2 Last Weight:  Wt Readings from Last 1 Encounters:  11/18/14  203 lb (92.08 kg)       Last Height:   Ht Readings from Last 1 Encounters:  11/18/14 5' 3.39" (1.61 m)    We repeated orthostatic blood pressures here today my nurse took a sitting blood pressure of 124/73 associated with a heart rate of 79, a standing blood pressure of 116/79 with a heart rate of 80 and a supine blood pressure of 117/76 with a heart rate of 81.   vision was 20/30 in both eyes. Physical exam:  General: The patient is awake, alert and appears not in acute distress. The patient is well groomed. Head: Normocephalic, atraumatic. Neck is supple. Mallampati 2   neck circumference:. Nasal airflow unrestricted  TMJ is  Is not evident . Retrognathia is seen.  The patient describes pressure at the left zygomatic arch, sometimes this area feels numb.  Cardiovascular:  Regular rate and rhythm, without  murmurs or carotid bruit, and without distended neck veins. Respiratory: Lungs are clear to auscultation. Skin:  Without evidence of edema, or rash Trunk: BMI is  elevated and patient  has normal posture.  Neurologic exam : The patient is awake and alert, oriented to place and time.   Memory subjective described as intact. There is a normal attention span & concentration ability. Speech is fluent without dysarthria, dysphonia or aphasia. Mood and affect are appropriate.  Cranial nerves: Pupils are equal and briskly reactive to light. Funduscopic exam without evidence of pallor or  edema.  Extraocular movements  in vertical and horizontal planes intact and without nystagmus. Visual fields by finger perimetry are intact. Hearing to finger rub intact.  Facial sensation intact to fine touch. Facial motor strength is symmetric and tongue and uvula move midline. Shoulder shrug is symmetric .  Coordination: Rapid alternating movements in the fingers/hands is normal. Finger-to-nose maneuver  normal without evidence of ataxia, dysmetria or tremor. Gait and station: Patient walks without assistive device and is able unassisted to climb up to the exam table. Strength within normal limits. Stance is stable and normal.  Tandem gait is unfragmented. Romberg testing is negative. Patient is able to stay stand on either leg for 10 sec. , she can turn with 3 steps, she is able to stand on toes and heels.  Assessment:  After physical and neurologic examination, review of laboratory studies, review of the actual images as well as the related reports, the actual testing by audiology and ENT,  imaging, neurophysiology testing and pre-existing records, assessment is   1)  postconcussion syndrome. A concussion is a mild traumatic brain injury. The patient had vertigo spells prior to her fall and concussion in December 2014 but the dizziness pattern changed with a fall = sense of the room spinning around her in a clockwise direction.  She now had lightheadedness and a feeling of imbalance. This was also a subjective feeling. In addition she developed a headache she had never had before - a frontal left throbbing headache with migrainous character as associated with photophobia, phonophobia nausea and sometimes vomiting. Excedrin Migraine helps her to abbreviate a migraine spell but she has almost daily recurrence now. The patient was given a prescription for topiramate was what was concerned about the possible side effects and decided not to take it , yet not to call me and inform me about this decision  either, until we have another discussion. Pressure today to try 25 mg at night and to keep a sleep and headache diary parallel to see if the headaches become  less frequent and her associated vertigo as well. Usually 50 mg either taking as 25 mg twice a day or 50,mg at night reduce the recurrence he of migrainous headaches by more than 50%.  I think that the migraine component being treated will positively affect the lightheadedness as well.  Visit duration was 15 minutes with 50% of our face-to-face time in this consultation related to the differential diagnosis,  explanation , answering questions, discussion of the images and lab results and physical examination results. 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   Plan is again to start topiramate as a preventive medication. RV with NP in 2-3 month.    11/18/2014

## 2014-12-29 ENCOUNTER — Ambulatory Visit (INDEPENDENT_AMBULATORY_CARE_PROVIDER_SITE_OTHER): Payer: BLUE CROSS/BLUE SHIELD | Admitting: Adult Health

## 2014-12-29 ENCOUNTER — Encounter: Payer: Self-pay | Admitting: Adult Health

## 2014-12-29 VITALS — BP 112/64 | HR 77 | Ht 63.0 in | Wt 201.0 lb

## 2014-12-29 DIAGNOSIS — R51 Headache: Secondary | ICD-10-CM | POA: Diagnosis not present

## 2014-12-29 DIAGNOSIS — R42 Dizziness and giddiness: Secondary | ICD-10-CM | POA: Diagnosis not present

## 2014-12-29 DIAGNOSIS — R519 Headache, unspecified: Secondary | ICD-10-CM

## 2014-12-29 NOTE — Progress Notes (Signed)
PATIENT: Debra Combs DOB: 10-26-1965  REASON FOR VISIT: follow up- headache, dizziness HISTORY FROM: patient  HISTORY OF PRESENT ILLNESS: Debra Combs is a 49 year old female with a history of dizziness and headaches. She returns today for follow-up. At the last visit the patient was started on Topamax 25 mg at bedtime. She reports that this has significantly improved her headaches. She states that she rarely has a headache now. She states that she may have one mild headache a week. She also confirms that she has not had any dizzy spells. She does report that she has some issues with word finding since starting Topamax however this is not significant enough to cause disruption with her job. The patient is doing well. She denies any new neurological symptoms. She returns today for an evaluation.  HISTORY 11/18/14: Debra Combs is a 49 y.o. female ,seen here as a referral from Dr. Orland Combs for an evaluation of dizziness and Vertigo, The patient reports that she presented first with symptoms of dizziness and vertigo to her primary care physician, who in turn referred her to a sleep study and to ENTs physician. Her sleep study was diagnostic for sleep apnea was performed 6 months ago at [facility] sleep services. She was placed on CPAP , Her dizziness did not resolve. ENT physician performed a [employer] maneuver and she said that initially she felt better " the crystals seemed to have been repositioned" but slowly and steadily the dizziness and vertigo feeling returned.  No spontaneous nystagmus was observed , the Dix-Hallpike maneuver was negative with head turning to either side , and in supine and nonsupine position. An MRI examination , CT of the head showed no abnormalities. Orthostatic blood pressures were obtained and showed a blood pressure standing 129/84 heart rate 77 sitting blood pressure 134/82 heart rate 70 bpm blood pressure in lying position 123/82 and heart rate 78 bpm. 2 weeks ago  in early February 2016 the patient's symptoms led to a referral to an MRI of the brain. She has trouble recalling the location and maintain of the radiology institute, it was [facility] imaging.  The patient fell in Dec 2014 and she feels that caused a different form of dizziness, less vertigo but more lightheadedness. The patient were diagnosed with a concussion, she did not lose awareness, has no amnesia. She did not vomit. Headaches Started 1-2 days later, left frontal , with migrainous Character. She went to the PCP 3 days later, feeling bad. He had problems with concentration, with sleep and with memory or multitasking for some time afterwards. She describes a tenderness and localized headache that still affects her over the left eyebrow frontally. The headache character is dull and throbbing. It has a pulsating character as well not sharp not burning not numb. There is no pressure behind the eye or ear. She usually takes an Excedrin to abbreviate the spell but they can last all day if left untreated. Bright light bothers her a lot during a headache, loud sounds to also but not to the same degree. She has been severely nauseated with some of these migraines and sometimes had to vomit. This is clearly a migraine. She is not able to sleep her headaches off, 7 out of 10 in intensity most times. No menstrual relation.   Debra Combs has a twin sister without vertigo or headaches. Screen describes that last year with a year of a lot of work-related stress but this year has been going much better for  her. She does not think that stress correlates to her current level of vertigo-dizziness.  REVIEW OF SYSTEMS: Out of a complete 14 system review of symptoms, the patient complains only of the following symptoms, and all other reviewed systems are negative.  Shortness of breath, restless leg, apnea  ALLERGIES: Allergies  Allergen Reactions  . Avelox [Moxifloxacin Hcl In Nacl]   . Nitrofurantoin Monohyd Macro    . Sulfa Antibiotics   . Zithromax [Azithromycin Dihydrate] Nausea And Vomiting  . Shrimp [Shellfish Allergy] Rash    HOME MEDICATIONS: Outpatient Prescriptions Prior to Visit  Medication Sig Dispense Refill  . ALBUTEROL IN Inhale into the lungs.      Marland Kitchen aspirin-acetaminophen-caffeine (EXCEDRIN MIGRAINE) 250-250-65 MG per tablet Take 1 tablet by mouth every 6 (six) hours as needed for headache.    . ciprofloxacin (CIPRO) 250 MG tablet Take 1 tablet (250 mg total) by mouth 2 (two) times daily. 6 tablet 0  . frovatriptan (FROVA) 2.5 MG tablet Take 1 tablet (2.5 mg total) by mouth as needed for migraine. If recurs, may repeat after 2 hours. Max of 3 tabs in 24 hours. 10 tablet 0  . Loratadine (CLARITIN PO) Take 1 tablet by mouth daily.     Marland Kitchen omeprazole (PRILOSEC) 20 MG capsule Take 20 mg by mouth daily.    Marland Kitchen QVAR 80 MCG/ACT inhaler     . spironolactone (ALDACTONE) 25 MG tablet Take 25 mg by mouth daily.      Marland Kitchen topiramate (TOPAMAX) 25 MG tablet Take 1 tablet (25 mg total) by mouth daily. 120 tablet 3  . tretinoin (RETIN-A) 0.025 % cream     . VENTOLIN HFA 108 (90 BASE) MCG/ACT inhaler as directed.      No facility-administered medications prior to visit.    PAST MEDICAL HISTORY: Past Medical History  Diagnosis Date  . Asthma   . Acne   . Breast cyst     left breast  . Anxiety   . GERD (gastroesophageal reflux disease)   . Cervical dysplasia 1997  . Dizziness   . HA (headache)   . Post-concussion headache 07/21/2014    PAST SURGICAL HISTORY: Past Surgical History  Procedure Laterality Date  . Endometrial ablation  2008    Her Option  . Dilation and curettage of uterus    . Ankle surgery  1983    Left  . Breast cyst excision  06/08/11    left breast  . Gynecologic cryosurgery  1997    FAMILY HISTORY: Family History  Problem Relation Age of Onset  . Diabetes Mother   . Hypertension Mother   . Esophageal cancer Mother   . Cancer Mother     esopphageal  . Kidney  failure Mother   . Hypertension Maternal Grandmother   . Diabetes Maternal Grandmother   . Leukemia Maternal Grandmother   . Esophageal cancer Father   . Cancer Father     esophageal  . Kidney failure Maternal Aunt   . Diabetes Maternal Aunt   . Kidney failure Maternal Uncle   . Diabetes Maternal Uncle     SOCIAL HISTORY: History   Social History  . Marital Status: Married    Spouse Name: [name]  . Number of Children: 2  . Years of Education: college   Occupational History  .      [employer]    Social History Main Topics  . Smoking status: Never Smoker   . Smokeless tobacco: Never Used  . Alcohol Use: No  .  Drug Use: No  . Sexual Activity: Yes    Birth Control/ Protection: Other-see comments     Comment: VASECTOMY, INTERCOUSE AGE 58, LESS THAN 5 SEXUAL PARTNERS   Other Topics Concern  . Not on file   Social History Narrative   Patient lives at home with her husband [name]) and her two children.   Patient works full time from home for [employer].   Education college.   Right handed.   Caffeine 32 oz daily.      PHYSICAL EXAM  Filed Vitals:   12/29/14 1256  BP: 112/64  Pulse: 77  Height: 5\' 3"  (1.6 m)  Weight: 201 lb (91.173 kg)   Body mass index is 35.61 kg/(m^2).  Generalized: Well developed, in no acute distress   Neurological examination  Mentation: Alert oriented to time, place, history taking. Follows all commands speech and language fluent Cranial nerve II-XII: Pupils were equal round reactive to light. Extraocular movements were full, visual field were full on confrontational test. Facial sensation and strength were normal. Uvula tongue midline. Head turning and shoulder shrug  were normal and symmetric. Motor: The motor testing reveals 5 over 5 strength of all 4 extremities. Good symmetric motor tone is noted throughout.  Sensory: Sensory testing is intact to soft touch on all 4 extremities. No evidence of extinction is noted.  Coordination:  Cerebellar testing reveals good finger-nose-finger and heel-to-shin bilaterally.  Gait and station: Gait is normal. Tandem gait is normal. Romberg is negative. No drift is seen.  Reflexes: Deep tendon reflexes are symmetric and normal bilaterally.   DIAGNOSTIC DATA (LABS, IMAGING, TESTING) - I reviewed patient records, labs, notes, testing and imaging myself where available.     ASSESSMENT AND PLAN 49 y.o. year old female  has a past medical history of Asthma; Acne; Breast cyst; Anxiety; GERD (gastroesophageal reflux disease); Cervical dysplasia (1997); Dizziness; HA (headache); and Post-concussion headache (07/21/2014). here with:  1. Headache 2. Dizziness  Overall the patient is doing well. She will continue on Topamax 25 mg at bedtime. I advised the patient that if her headache frequency increases or she begins to have increased episodes of dizziness she should let us know. Patient verbalized understanding. She will follow-up in 6 months or sooner if needed.   Ward Givens, MSN, NP-C 12/29/2014, 1:09 PM [facility] [address], [place], [place] [address] [phone]  Note: This document was prepared with digital dictation and possible smart phrase technology. Any transcriptional errors that result from this process are unintentional.

## 2014-12-29 NOTE — Progress Notes (Signed)
I agree with the assessment and plan as directed by NP .The patient is known to me .   Armetta Henri, MD  

## 2014-12-29 NOTE — Patient Instructions (Signed)
Continue Topamax 25 mg daily. If your symptoms worsen or you develop new symptoms please let us know.

## 2014-12-29 NOTE — Progress Notes (Signed)
I agree with the assessment and plan as directed by NP .The patient is known to me .   Brynlyn Dade, MD  

## 2015-02-06 ENCOUNTER — Other Ambulatory Visit: Payer: Self-pay

## 2015-02-06 DIAGNOSIS — G43829 Menstrual migraine, not intractable, without status migrainosus: Secondary | ICD-10-CM

## 2015-02-06 DIAGNOSIS — F0781 Postconcussional syndrome: Secondary | ICD-10-CM

## 2015-02-06 MED ORDER — TOPIRAMATE 25 MG PO TABS: 25.0000 mg | ORAL_TABLET | Freq: Every day | ORAL | Status: DC

## 2015-04-14 ENCOUNTER — Encounter: Payer: BLUE CROSS/BLUE SHIELD | Attending: Physician Assistant | Admitting: *Deleted

## 2015-04-14 ENCOUNTER — Encounter: Payer: Self-pay | Admitting: *Deleted

## 2015-04-14 VITALS — Ht 63.0 in | Wt 203.0 lb

## 2015-04-14 DIAGNOSIS — Z6835 Body mass index (BMI) 35.0-35.9, adult: Secondary | ICD-10-CM | POA: Insufficient documentation

## 2015-04-14 DIAGNOSIS — Z713 Dietary counseling and surveillance: Secondary | ICD-10-CM | POA: Diagnosis not present

## 2015-04-14 DIAGNOSIS — E669 Obesity, unspecified: Secondary | ICD-10-CM | POA: Diagnosis not present

## 2015-04-14 NOTE — Progress Notes (Signed)
  Medical Nutrition Therapy:  Appt start time: 1100 end time:  1200.  Assessment:  Primary concerns today: Debra Combs is here for nutrition counseling pertaining to obesity.  She mentioned an interest in weight-loss surgery (lap-band).  Her provider suggested talking with a RD first.  She states she has struggled with her weight her whole life, but this is the heaviest she has been.  Her job changed and now she works from home and is more sedentary .  She has attended the free seminar of bariatric surgery.  She states she meets the criteria due to her sleep apnea.  Heaviest weight as adult: currently 203 lb Lowest weight ad adult: 145 lb Most consistent weight: 160-165 lb Would like to weigh: 160-165 lb  Both parents are deceased.  She states dad was not heavy and mom was larger, but not heavy.  She states that her family member are larger, "thicker".  She states many family members are heavier.   She states she has tried various weight loss methods in the past: Weight Watchers, Rickard Patience, Phys[facility] Assisted Weight loss, pills, dieting.  Thinks she is "addicted" to sweet tea.  Does the grocery shopping for her household.  They do not at home much and do eat out most days: [place], [place], [place]. When at home she sometimes eats in the kitchen, but most times in front of the tv in the den.  She is a fast eater. works 4 10 hour days.  She is not active, routinely skips meals, routinely over eats, rarely eats fruits, vegetables, whole grains, or dairy products.  Wants a meal plan   Preferred Learning Style:   No preference indicated   Learning Readiness:   Contemplating   MEDICATIONS: see list   DIETARY INTAKE:  Usual eating pattern includes 2 meals and 0 snacks per day.  Everyday foods include energy-dense convenience items.  Avoided foods include none.    24-hr recall:  B ( AM): none  Snk ( AM): none  L ( PM): sausage dog Snk ( PM): none D ( PM):  lasagna Snk ( PM): none Beverages: tea from [employer] and tea with dinner  Usual physical activity: none currently.  Used to go to gym, but stopped in June. "just got out of the habit"  Estimated energy needs: 1800 calories   Nutritional Diagnosis:  NB-1.3 Not ready for diet/lifestyle change  As related to intuitive eating.  As evidenced by desire for quick diet plan and/or bariatric surgery.    Intervention:  Nutrition counseling provided.  Discussed metabolic effects of meal skipping/dieting behaviors.  Recommended 3 meals/day based on MyPlate recommendations.  Recommended fruits, vegetables, and diary products to be consumed more frequently.  Recommended mindful eating: eating without distractions and eating more slowly.  Recommended stopping when comfortable, not stuffed.  Recommended moving in ways she enjoys in the time that she has.  She states she "Can do anything for 10 minutes."  Teaching Method Utilized: Visual Auditory   Barriers to learning/adherence to lifestyle change: readiness  Demonstrated degree of understanding via:  Teach Back   Monitoring/Evaluation:  Dietary intake, exercise, labs, and body weight prn.

## 2015-04-15 ENCOUNTER — Encounter: Payer: Self-pay | Admitting: Gynecology

## 2015-07-06 ENCOUNTER — Ambulatory Visit (INDEPENDENT_AMBULATORY_CARE_PROVIDER_SITE_OTHER): Payer: BLUE CROSS/BLUE SHIELD | Admitting: Adult Health

## 2015-07-06 ENCOUNTER — Encounter: Payer: Self-pay | Admitting: Adult Health

## 2015-07-06 VITALS — BP 126/82 | HR 79 | Resp 16 | Ht 63.0 in | Wt 207.0 lb

## 2015-07-06 DIAGNOSIS — G43009 Migraine without aura, not intractable, without status migrainosus: Secondary | ICD-10-CM | POA: Diagnosis not present

## 2015-07-06 DIAGNOSIS — F0781 Postconcussional syndrome: Secondary | ICD-10-CM

## 2015-07-06 MED ORDER — TOPIRAMATE 25 MG PO TABS: 25.0000 mg | ORAL_TABLET | Freq: Every day | ORAL | Status: DC

## 2015-07-06 NOTE — Progress Notes (Signed)
PATIENT: Debra Combs DOB: 1965/07/15  REASON FOR VISIT: follow up- headache HISTORY FROM: patient  HISTORY OF PRESENT ILLNESS: Debra Combs is a 50 year old female with a history of headaches. She returns today for follow-up. The patient reports that her headaches have been controlled with Topamax. She states that she may have one maybe 2 true migraines a month she states that she has been having sinus headaches and plans to follow-up with her primary care for this. The patient states that when she gets a true migraine it is normally located on one side of the head typically in the left temporal region. With severe headache she will have photophobia, phonophobia and nausea. She states that with the Topamax she has noticed some word finding issues. At first she related it to her concussion from a year ago but has gotten slightly worse. At this time she feels that it is tolerable. She is actually not been taking Topamax for 2 weeks due to running out of the medication. She returns today for an evaluation. Marland Kitchen HISTORY 12/29/14:Debra Combs is a 50 year old female with a history of dizziness and headaches. She returns today for follow-up. At the last visit the patient was started on Topamax 25 mg at bedtime. She reports that this has significantly improved her headaches. She states that she rarely has a headache now. She states that she may have one mild headache a week. She also confirms that she has not had any dizzy spells. She does report that she has some issues with word finding since starting Topamax however this is not significant enough to cause disruption with her job. The patient is doing well. She denies any new neurological symptoms. She returns today for an evaluation.  HISTORY 11/18/14: Debra Combs is a 50 y.o. female ,seen here as a referral from Dr. Orland Penman for an evaluation of dizziness and Vertigo, The patient reports that she presented first with symptoms of dizziness and vertigo to her  primary care physician, who in turn referred her to a sleep study and to ENTs physician. Her sleep study was diagnostic for sleep apnea was performed 6 months ago at [facility] sleep services. She was placed on CPAP , Her dizziness did not resolve. ENT physician performed a [employer] maneuver and she said that initially she felt better " the crystals seemed to have been repositioned" but slowly and steadily the dizziness and vertigo feeling returned.  No spontaneous nystagmus was observed , the Dix-Hallpike maneuver was negative with head turning to either side , and in supine and nonsupine position. An MRI examination , CT of the head showed no abnormalities. Orthostatic blood pressures were obtained and showed a blood pressure standing 129/84 heart rate 77 sitting blood pressure 134/82 heart rate 70 bpm blood pressure in lying position 123/82 and heart rate 78 bpm. 2 weeks ago in early February 2016 the patient's symptoms led to a referral to an MRI of the brain. She has trouble recalling the location and maintain of the radiology institute, it was [facility] imaging.  The patient fell in Dec 2014 and she feels that caused a different form of dizziness, less vertigo but more lightheadedness. The patient were diagnosed with a concussion, she did not lose awareness, has no amnesia. She did not vomit. Headaches Started 1-2 days later, left frontal , with migrainous Character. She went to the PCP 3 days later, feeling bad. He had problems with concentration, with sleep and with memory or multitasking for some time  afterwards. She describes a tenderness and localized headache that still affects her over the left eyebrow frontally. The headache character is dull and throbbing. It has a pulsating character as well not sharp not burning not numb. There is no pressure behind the eye or ear. She usually takes an Excedrin to abbreviate the spell but they can last all day if left untreated. Bright light bothers her a  lot during a headache, loud sounds to also but not to the same degree. She has been severely nauseated with some of these migraines and sometimes had to vomit. This is clearly a migraine. She is not able to sleep her headaches off, 7 out of 10 in intensity most times. No menstrual relation.   Debra Combs has a twin sister without vertigo or headaches. Screen describes that last year with a year of a lot of work-related stress but this year has been going much better for her. She does not think that stress correlates to her current level of vertigo-dizziness.   REVIEW OF SYSTEMS: Out of a complete 14 system review of symptoms, the patient complains only of the following symptoms, and all other reviewed systems are negative.  Speech difficulty, dizziness, restless leg, apnea  ALLERGIES: Allergies  Allergen Reactions  . Avelox [Moxifloxacin Hcl In Nacl]   . Nitrofurantoin Monohyd Macro   . Sulfa Antibiotics   . Zithromax [Azithromycin Dihydrate] Nausea And Vomiting  . Shrimp [Shellfish Allergy] Rash    HOME MEDICATIONS: Outpatient Prescriptions Prior to Visit  Medication Sig Dispense Refill  . ALBUTEROL IN Inhale into the lungs.      Marland Kitchen aspirin-acetaminophen-caffeine (EXCEDRIN MIGRAINE) 250-250-65 MG per tablet Take 1 tablet by mouth every 6 (six) hours as needed for headache.    . ciprofloxacin (CIPRO) 250 MG tablet Take 1 tablet (250 mg total) by mouth 2 (two) times daily. 6 tablet 0  . frovatriptan (FROVA) 2.5 MG tablet Take 1 tablet (2.5 mg total) by mouth as needed for migraine. If recurs, may repeat after 2 hours. Max of 3 tabs in 24 hours. 10 tablet 0  . Loratadine (CLARITIN PO) Take 1 tablet by mouth daily.     Marland Kitchen omeprazole (PRILOSEC) 20 MG capsule Take 20 mg by mouth daily.    Marland Kitchen QVAR 80 MCG/ACT inhaler     . spironolactone (ALDACTONE) 25 MG tablet Take 25 mg by mouth daily.      Marland Kitchen topiramate (TOPAMAX) 25 MG tablet Take 1 tablet (25 mg total) by mouth daily. 90 tablet 3  . tretinoin  (RETIN-A) 0.025 % cream     . VENTOLIN HFA 108 (90 BASE) MCG/ACT inhaler as directed.      No facility-administered medications prior to visit.    PAST MEDICAL HISTORY: Past Medical History  Diagnosis Date  . Asthma   . Acne   . Breast cyst     left breast  . Anxiety   . GERD (gastroesophageal reflux disease)   . Cervical dysplasia 1997  . Dizziness   . HA (headache)   . Post-concussion headache 07/21/2014    PAST SURGICAL HISTORY: Past Surgical History  Procedure Laterality Date  . Endometrial ablation  2008    [facility]  . Dilation and curettage of uterus    . Ankle surgery  1983    Left  . Breast cyst excision  06/08/11    left breast  . Gynecologic cryosurgery  1997    FAMILY HISTORY: Family History  Problem Relation Age of Onset  .  Diabetes Mother   . Hypertension Mother   . Esophageal cancer Mother   . Cancer Mother     esopphageal  . Kidney failure Mother   . Hypertension Maternal Grandmother   . Diabetes Maternal Grandmother   . Leukemia Maternal Grandmother   . Esophageal cancer Father   . Cancer Father     esophageal  . Kidney failure Maternal Aunt   . Diabetes Maternal Aunt   . Kidney failure Maternal Uncle   . Diabetes Maternal Uncle     SOCIAL HISTORY: Social History   Social History  . Marital Status: Married    Spouse Name: [name]  . Number of Children: 2  . Years of Education: college   Occupational History  .      [employer]    Social History Main Topics  . Smoking status: Never Smoker   . Smokeless tobacco: Never Used  . Alcohol Use: No  . Drug Use: No  . Sexual Activity: Yes    Birth Control/ Protection: Other-see comments     Comment: VASECTOMY, INTERCOUSE AGE 33, LESS THAN 5 SEXUAL PARTNERS   Other Topics Concern  . Not on file   Social History Narrative   Patient lives at home with her husband [name]) and her two children.   Patient works full time from home for [employer].   Education college.   Right handed.    Caffeine 32 oz daily.      PHYSICAL EXAM  Filed Vitals:   07/06/15 0922  BP: 126/82  Pulse: 79  Resp: 16  Height: 5\' 3"  (1.6 m)  Weight: 207 lb (93.895 kg)   Body mass index is 36.68 kg/(m^2).  Generalized: Well developed, in no acute distress   Neurological examination  Mentation: Alert oriented to time, place, history taking. Follows all commands speech and language fluent Cranial nerve II-XII: Pupils were equal round reactive to light. Extraocular movements were full, visual field were full on confrontational test. Facial sensation and strength were normal. Uvula tongue midline. Head turning and shoulder shrug  were normal and symmetric. Motor: The motor testing reveals 5 over 5 strength of all 4 extremities. Good symmetric motor tone is noted throughout.  Sensory: Sensory testing is intact to soft touch on all 4 extremities. No evidence of extinction is noted.  Coordination: Cerebellar testing reveals good finger-nose-finger and heel-to-shin bilaterally.  Gait and station: Gait is normal. Tandem gait is normal. Romberg is negative. No drift is seen.  Reflexes: Deep tendon reflexes are symmetric and normal bilaterally.   DIAGNOSTIC DATA (LABS, IMAGING, TESTING) - I reviewed patient records, labs, notes, testing and imaging myself where available.     ASSESSMENT AND PLAN 50 y.o. year old female  has a past medical history of Asthma; Acne; Breast cyst; Anxiety; GERD (gastroesophageal reflux disease); Cervical dysplasia (1997); Dizziness; HA (headache); and Post-concussion headache (07/21/2014). here with:  1. Migraine headaches  The patient will try to remain off of Topamax and see how this affects her headaches. Of course if she begins to have migraine headache she can restart the Topamax 25 mg at bedtime. I have sent in for refill. Patient advised that if the word finding/cognitive slowing becomes intolerable and we can consider another medication. Patient verbalized  understanding. She will follow-up in 6 months or sooner if needed.  Ward Givens, MSN, NP-C 07/06/2015, 9:35 AM [facility] [address] [place], [place] [address] [phone]

## 2015-07-06 NOTE — Progress Notes (Signed)
I agree with the assessment and plan as directed by NP .The patient is known to me .   Alfie Rideaux, MD  

## 2015-07-06 NOTE — Patient Instructions (Signed)
You can stay off Topamax and see how it effects your headaches. If headaches return you may restart it. Prescription sent If word finding and cognitive slowing worsens we can consider another medication If your symptoms worsen or you develop new symptoms please let us know.

## 2015-09-15 ENCOUNTER — Ambulatory Visit (INDEPENDENT_AMBULATORY_CARE_PROVIDER_SITE_OTHER): Payer: BLUE CROSS/BLUE SHIELD | Admitting: Women's Health

## 2015-09-15 ENCOUNTER — Encounter: Payer: Self-pay | Admitting: Women's Health

## 2015-09-15 VITALS — BP 120/82 | Ht 63.0 in | Wt 203.0 lb

## 2015-09-15 DIAGNOSIS — Z01419 Encounter for gynecological examination (general) (routine) without abnormal findings: Secondary | ICD-10-CM

## 2015-09-15 NOTE — Patient Instructions (Signed)
Constipation, Adult Constipation is when a person has fewer than three bowel movements a week, has difficulty having a bowel movement, or has stools that are dry, hard, or larger than normal. As people grow older, constipation is more common. A low-fiber diet, not taking in enough fluids, and taking certain medicines may make constipation worse.  CAUSES   Certain medicines, such as antidepressants, pain medicine, iron supplements, antacids, and water pills.   Certain diseases, such as diabetes, irritable bowel syndrome (IBS), thyroid disease, or depression.   Not drinking enough water.   Not eating enough fiber-rich foods.   Stress or travel.   Lack of physical activity or exercise.   Ignoring the urge to have a bowel movement.   Using laxatives too much.  SIGNS AND SYMPTOMS   Having fewer than three bowel movements a week.   Straining to have a bowel movement.   Having stools that are hard, dry, or larger than normal.   Feeling full or bloated.   Pain in the lower abdomen.   Not feeling relief after having a bowel movement.  DIAGNOSIS  Your health care provider will take a medical history and perform a physical exam. Further testing may be done for severe constipation. Some tests may include:  A barium enema X-ray to examine your rectum, colon, and, sometimes, your small intestine.   A sigmoidoscopy to examine your lower colon.   A colonoscopy to examine your entire colon. TREATMENT  Treatment will depend on the severity of your constipation and what is causing it. Some dietary treatments include drinking more fluids and eating more fiber-rich foods. Lifestyle treatments may include regular exercise. If these diet and lifestyle recommendations do not help, your health care provider may recommend taking over-the-counter laxative medicines to help you have bowel movements. Prescription medicines may be prescribed if over-the-counter medicines do not work.   HOME CARE INSTRUCTIONS   Eat foods that have a lot of fiber, such as fruits, vegetables, whole grains, and beans.  Limit foods high in fat and processed sugars, such as french fries, hamburgers, cookies, candies, and soda.   A fiber supplement may be added to your diet if you cannot get enough fiber from foods.   Drink enough fluids to keep your urine clear or pale yellow.   Exercise regularly or as directed by your health care provider.   Go to the restroom when you have the urge to go. Do not hold it.   Only take over-the-counter or prescription medicines as directed by your health care provider. Do not take other medicines for constipation without talking to your health care provider first.  SEEK IMMEDIATE MEDICAL CARE IF:   You have bright red blood in your stool.   Your constipation lasts for more than 4 days or gets worse.   You have abdominal or rectal pain.   You have thin, pencil-like stools.   You have unexplained weight loss. MAKE SURE YOU:   Understand these instructions.  Will watch your condition.  Will get help right away if you are not doing well or get worse.   This information is not intended to replace advice given to you by your health care provider. Make sure you discuss any questions you have with your health care provider.   Document Released: 02/10/2004 Document Revised: 06/04/2014 Document Reviewed: 02/23/2013 Elsevier Interactive Patient Education 2016 [employer]. Health Maintenance, Female Adopting a healthy lifestyle and getting preventive care can go a long way to promote health and wellness.  Talk with your health care provider about what schedule of regular examinations is right for you. This is a good chance for you to check in with your provider about disease prevention and staying healthy. In between checkups, there are plenty of things you can do on your own. Experts have done a lot of research about which lifestyle changes and  preventive measures are most likely to keep you healthy. Ask your health care provider for more information. WEIGHT AND DIET  Eat a healthy diet  Be sure to include plenty of vegetables, fruits, low-fat dairy products, and lean protein.  Do not eat a lot of foods high in solid fats, added sugars, or salt.  Get regular exercise. This is one of the most important things you can do for your health.  Most adults should exercise for at least 150 minutes each week. The exercise should increase your heart rate and make you sweat (moderate-intensity exercise).  Most adults should also do strengthening exercises at least twice a week. This is in addition to the moderate-intensity exercise.  Maintain a healthy weight  Body mass index (BMI) is a measurement that can be used to identify possible weight problems. It estimates body fat based on height and weight. Your health care provider can help determine your BMI and help you achieve or maintain a healthy weight.  For females 79 years of age and older:   A BMI below 18.5 is considered underweight.  A BMI of 18.5 to 24.9 is normal.  A BMI of 25 to 29.9 is considered overweight.  A BMI of 30 and above is considered obese.  Watch levels of cholesterol and blood lipids  You should start having your blood tested for lipids and cholesterol at 49 years of age, then have this test every 5 years.  You may need to have your cholesterol levels checked more often if:  Your lipid or cholesterol levels are high.  You are older than 49 years of age.  You are at high risk for heart disease.  CANCER SCREENING   Lung Cancer  Lung cancer screening is recommended for adults 48-82 years old who are at high risk for lung cancer because of a history of smoking.  A yearly low-dose CT scan of the lungs is recommended for people who:  Currently smoke.  Have quit within the past 15 years.  Have at least a 30-pack-year history of smoking. A pack year  is smoking an average of one pack of cigarettes a day for 1 year.  Yearly screening should continue until it has been 15 years since you quit.  Yearly screening should stop if you develop a health problem that would prevent you from having lung cancer treatment.  Breast Cancer  Practice breast self-awareness. This means understanding how your breasts normally appear and feel.  It also means doing regular breast self-exams. Let your health care provider know about any changes, no matter how small.  If you are in your 20s or 30s, you should have a clinical breast exam (CBE) by a health care provider every 1-3 years as part of a regular health exam.  If you are 25 or older, have a CBE every year. Also consider having a breast X-ray (mammogram) every year.  If you have a family history of breast cancer, talk to your health care provider about genetic screening.  If you are at high risk for breast cancer, talk to your health care provider about having an MRI and a mammogram  every year.  Breast cancer gene (BRCA) assessment is recommended for women who have family members with BRCA-related cancers. BRCA-related cancers include:  Breast.  Ovarian.  Tubal.  Peritoneal cancers.  Results of the assessment will determine the need for genetic counseling and BRCA1 and BRCA2 testing. Cervical Cancer Your health care provider may recommend that you be screened regularly for cancer of the pelvic organs (ovaries, uterus, and vagina). This screening involves a pelvic examination, including checking for microscopic changes to the surface of your cervix (Pap test). You may be encouraged to have this screening done every 3 years, beginning at age 65.  For women ages 11-65, health care providers may recommend pelvic exams and Pap testing every 3 years, or they may recommend the Pap and pelvic exam, combined with testing for human papilloma virus (HPV), every 5 years. Some types of HPV increase your risk  of cervical cancer. Testing for HPV may also be done on women of any age with unclear Pap test results.  Other health care providers may not recommend any screening for nonpregnant women who are considered low risk for pelvic cancer and who do not have symptoms. Ask your health care provider if a screening pelvic exam is right for you.  If you have had past treatment for cervical cancer or a condition that could lead to cancer, you need Pap tests and screening for cancer for at least 20 years after your treatment. If Pap tests have been discontinued, your risk factors (such as having a new sexual partner) need to be reassessed to determine if screening should resume. Some women have medical problems that increase the chance of getting cervical cancer. In these cases, your health care provider may recommend more frequent screening and Pap tests. Colorectal Cancer  This type of cancer can be detected and often prevented.  Routine colorectal cancer screening usually begins at 49 years of age and continues through 49 years of age.  Your health care provider may recommend screening at an earlier age if you have risk factors for colon cancer.  Your health care provider may also recommend using home test kits to check for hidden blood in the stool.  A small camera at the end of a tube can be used to examine your colon directly (sigmoidoscopy or colonoscopy). This is done to check for the earliest forms of colorectal cancer.  Routine screening usually begins at age 21.  Direct examination of the colon should be repeated every 5-10 years through 49 years of age. However, you may need to be screened more often if early forms of precancerous polyps or small growths are found. Skin Cancer  Check your skin from head to toe regularly.  Tell your health care provider about any new moles or changes in moles, especially if there is a change in a mole's shape or color.  Also tell your health care provider if  you have a mole that is larger than the size of a pencil eraser.  Always use sunscreen. Apply sunscreen liberally and repeatedly throughout the day.  Protect yourself by wearing long sleeves, pants, a wide-brimmed hat, and sunglasses whenever you are outside. HEART DISEASE, DIABETES, AND HIGH BLOOD PRESSURE   High blood pressure causes heart disease and increases the risk of stroke. High blood pressure is more likely to develop in:  People who have blood pressure in the high end of the normal range (130-139/85-89 mm Hg).  People who are overweight or obese.  People who are African American.  If you are 53-72 years of age, have your blood pressure checked every 3-5 years. If you are 64 years of age or older, have your blood pressure checked every year. You should have your blood pressure measured twice--once when you are at a hospital or clinic, and once when you are not at a hospital or clinic. Record the average of the two measurements. To check your blood pressure when you are not at a hospital or clinic, you can use:  An automated blood pressure machine at a pharmacy.  A home blood pressure monitor.  If you are between 15 years and 48 years old, ask your health care provider if you should take aspirin to prevent strokes.  Have regular diabetes screenings. This involves taking a blood sample to check your fasting blood sugar level.  If you are at a normal weight and have a low risk for diabetes, have this test once every three years after 49 years of age.  If you are overweight and have a high risk for diabetes, consider being tested at a younger age or more often. PREVENTING INFECTION  Hepatitis B  If you have a higher risk for hepatitis B, you should be screened for this virus. You are considered at high risk for hepatitis B if:  You were born in a country where hepatitis B is common. Ask your health care provider which countries are considered high risk.  Your parents were  born in a high-risk country, and you have not been immunized against hepatitis B (hepatitis B vaccine).  You have HIV or AIDS.  You use needles to inject street drugs.  You live with someone who has hepatitis B.  You have had sex with someone who has hepatitis B.  You get hemodialysis treatment.  You take certain medicines for conditions, including cancer, organ transplantation, and autoimmune conditions. Hepatitis C  Blood testing is recommended for:  Everyone born from [birth year] through 1964.  Anyone with known risk factors for hepatitis C. Sexually transmitted infections (STIs)  You should be screened for sexually transmitted infections (STIs) including gonorrhea and chlamydia if:  You are sexually active and are younger than 49 years of age.  You are older than 49 years of age and your health care provider tells you that you are at risk for this type of infection.  Your sexual activity has changed since you were last screened and you are at an increased risk for chlamydia or gonorrhea. Ask your health care provider if you are at risk.  If you do not have HIV, but are at risk, it may be recommended that you take a prescription medicine daily to prevent HIV infection. This is called pre-exposure prophylaxis (PrEP). You are considered at risk if:  You are sexually active and do not regularly use condoms or know the HIV status of your partner(s).  You take drugs by injection.  You are sexually active with a partner who has HIV. Talk with your health care provider about whether you are at high risk of being infected with HIV. If you choose to begin PrEP, you should first be tested for HIV. You should then be tested every 3 months for as long as you are taking PrEP.  PREGNANCY   If you are premenopausal and you may become pregnant, ask your health care provider about preconception counseling.  If you may become pregnant, take 400 to 800 micrograms (mcg) of folic acid every  day.  If you want to prevent pregnancy, talk  to your health care provider about birth control (contraception). OSTEOPOROSIS AND MENOPAUSE   Osteoporosis is a disease in which the bones lose minerals and strength with aging. This can result in serious bone fractures. Your risk for osteoporosis can be identified using a bone density scan.  If you are 28 years of age or older, or if you are at risk for osteoporosis and fractures, ask your health care provider if you should be screened.  Ask your health care provider whether you should take a calcium or vitamin D supplement to lower your risk for osteoporosis.  Menopause may have certain physical symptoms and risks.  Hormone replacement therapy may reduce some of these symptoms and risks. Talk to your health care provider about whether hormone replacement therapy is right for you.  HOME CARE INSTRUCTIONS   Schedule regular health, dental, and eye exams.  Stay current with your immunizations.   Do not use any tobacco products including cigarettes, chewing tobacco, or electronic cigarettes.  If you are pregnant, do not drink alcohol.  If you are breastfeeding, limit how much and how often you drink alcohol.  Limit alcohol intake to no more than 1 drink per day for nonpregnant women. One drink equals 12 ounces of beer, 5 ounces of wine, or 1 ounces of hard liquor.  Do not use street drugs.  Do not share needles.  Ask your health care provider for help if you need support or information about quitting drugs.  Tell your health care provider if you often feel depressed.  Tell your health care provider if you have ever been abused or do not feel safe at home.   This information is not intended to replace advice given to you by your health care provider. Make sure you discuss any questions you have with your health care provider.   Document Released: 11/27/2010 Document Revised: 06/04/2014 Document Reviewed: 04/15/2013 Elsevier  Interactive Patient Education [employer].

## 2015-09-15 NOTE — Progress Notes (Signed)
Debra Combs 06-07-1965 [id number]    History:    Presents for annual exam.  Monthly cycles/vasectomy. 2007 ablation has continued to have cycles but they are lighter. 1997 cryo-with normal Paps after. [facility] primary care. Normal mammogram history. History of a 3 cm fibroid.  Past medical history, past surgical history, family history and social history were all reviewed and documented in the EPIC chart. Works from home, daughter 51, son 70 both doing well.  ROS:  A ROS was performed and pertinent positives and negatives are included.  Exam:  Filed Vitals:   09/15/15 1114  BP: 120/82    General appearance:  Normal Thyroid:  Symmetrical, normal in size, without palpable masses or nodularity. Respiratory  Auscultation:  Clear without wheezing or rhonchi Cardiovascular  Auscultation:  Regular rate, without rubs, murmurs or gallops  Edema/varicosities:  Not grossly evident Abdominal  Soft,nontender, without masses, guarding or rebound.  Liver/spleen:  No organomegaly noted  Hernia:  None appreciated  Skin  Inspection:  Grossly normal   Breasts: Examined lying and sitting.     Right: Without masses, retractions, discharge or axillary adenopathy.     Left: Without masses, retractions, discharge or axillary adenopathy. Gentitourinary   Inguinal/mons:  Normal without inguinal adenopathy  External genitalia:  Normal  BUS/Urethra/Skene's glands:  Normal  Vagina:  Normal  Cervix:  Normal  Uterus:   8 weeks' size, shape and contour.  Midline and mobile  Adnexa/parametria:     Rt: Without masses or tenderness.   Lt: Without masses or tenderness.  Anus and perineum: Normal  Digital rectal exam: Normal sphincter tone without palpated masses or tenderness  Assessment/Plan:  50 y.o. MBF G2 P2 for annual exam with no complaints.  Monthly cycle/vasectomy Fibroid uterus 2007 ablation 1997 cryo-with normal Paps after [facility]-primary care  Plan: SBE's, annual screening mammogram  continue 3-D. Exercise, calcium rich diet, vitamin D 1000 daily encouraged. Encouraged increased exercise and decrease calories for weight loss. Pap with negative HR HPV 2016, new screening guidelines reviewed.    Huel Cote [facility], 12:52 PM 09/15/2015

## 2015-09-16 LAB — URINALYSIS W MICROSCOPIC + REFLEX CULTURE
BACTERIA UA: NONE SEEN [HPF]
Bilirubin Urine: NEGATIVE
CRYSTALS: NONE SEEN [HPF]
Casts: NONE SEEN [LPF]
Glucose, UA: NEGATIVE
Ketones, ur: NEGATIVE
Leukocytes, UA: NEGATIVE
Nitrite: NEGATIVE
PROTEIN: NEGATIVE
Specific Gravity, Urine: 1.021 (ref 1.001–1.035)
WBC, UA: NONE SEEN WBC/HPF (ref <=5)
Yeast: NONE SEEN [HPF]
pH: 6.5 (ref 5.0–8.0)

## 2015-09-17 LAB — URINE CULTURE

## 2015-11-23 DIAGNOSIS — L659 Nonscarring hair loss, unspecified: Secondary | ICD-10-CM | POA: Diagnosis not present

## 2015-11-23 DIAGNOSIS — L218 Other seborrheic dermatitis: Secondary | ICD-10-CM | POA: Diagnosis not present

## 2015-12-02 DIAGNOSIS — M940 Chondrocostal junction syndrome [Tietze]: Secondary | ICD-10-CM | POA: Diagnosis not present

## 2015-12-02 DIAGNOSIS — R5383 Other fatigue: Secondary | ICD-10-CM | POA: Diagnosis not present

## 2015-12-02 DIAGNOSIS — L659 Nonscarring hair loss, unspecified: Secondary | ICD-10-CM | POA: Diagnosis not present

## 2016-01-05 ENCOUNTER — Ambulatory Visit: Payer: BLUE CROSS/BLUE SHIELD | Admitting: Adult Health

## 2016-01-23 ENCOUNTER — Ambulatory Visit: Payer: BLUE CROSS/BLUE SHIELD | Admitting: Adult Health

## 2016-02-12 DIAGNOSIS — J069 Acute upper respiratory infection, unspecified: Secondary | ICD-10-CM | POA: Diagnosis not present

## 2016-02-22 DIAGNOSIS — G4733 Obstructive sleep apnea (adult) (pediatric): Secondary | ICD-10-CM | POA: Diagnosis not present

## 2016-02-24 DIAGNOSIS — R21 Rash and other nonspecific skin eruption: Secondary | ICD-10-CM | POA: Diagnosis not present

## 2016-02-24 DIAGNOSIS — J309 Allergic rhinitis, unspecified: Secondary | ICD-10-CM | POA: Diagnosis not present

## 2016-02-24 DIAGNOSIS — J4530 Mild persistent asthma, uncomplicated: Secondary | ICD-10-CM | POA: Diagnosis not present

## 2016-02-24 DIAGNOSIS — H1045 Other chronic allergic conjunctivitis: Secondary | ICD-10-CM | POA: Diagnosis not present

## 2016-03-16 ENCOUNTER — Other Ambulatory Visit: Payer: Self-pay | Admitting: Allergy

## 2016-03-16 ENCOUNTER — Ambulatory Visit
Admission: RE | Admit: 2016-03-16 | Discharge: 2016-03-16 | Disposition: A | Discharge status: Home / Self Care | Payer: BLUE CROSS/BLUE SHIELD | Source: Ambulatory Visit | Attending: Allergy | Admitting: Allergy

## 2016-03-16 DIAGNOSIS — J4530 Mild persistent asthma, uncomplicated: Secondary | ICD-10-CM

## 2016-03-16 DIAGNOSIS — R05 Cough: Secondary | ICD-10-CM | POA: Diagnosis not present

## 2016-03-26 DIAGNOSIS — H43393 Other vitreous opacities, bilateral: Secondary | ICD-10-CM | POA: Diagnosis not present

## 2016-03-26 DIAGNOSIS — H04123 Dry eye syndrome of bilateral lacrimal glands: Secondary | ICD-10-CM | POA: Diagnosis not present

## 2016-04-14 DIAGNOSIS — Z1231 Encounter for screening mammogram for malignant neoplasm of breast: Secondary | ICD-10-CM | POA: Diagnosis not present

## 2016-04-26 ENCOUNTER — Encounter: Payer: Self-pay | Admitting: Women's Health

## 2016-05-10 DIAGNOSIS — Z23 Encounter for immunization: Secondary | ICD-10-CM | POA: Diagnosis not present

## 2016-05-10 DIAGNOSIS — M79674 Pain in right toe(s): Secondary | ICD-10-CM | POA: Diagnosis not present

## 2016-05-10 DIAGNOSIS — D2261 Melanocytic nevi of right upper limb, including shoulder: Secondary | ICD-10-CM | POA: Diagnosis not present

## 2016-05-10 DIAGNOSIS — D1801 Hemangioma of skin and subcutaneous tissue: Secondary | ICD-10-CM | POA: Diagnosis not present

## 2016-05-10 DIAGNOSIS — D2362 Other benign neoplasm of skin of left upper limb, including shoulder: Secondary | ICD-10-CM | POA: Diagnosis not present

## 2016-05-10 DIAGNOSIS — D224 Melanocytic nevi of scalp and neck: Secondary | ICD-10-CM | POA: Diagnosis not present

## 2016-06-04 DIAGNOSIS — M7751 Other enthesopathy of right foot: Secondary | ICD-10-CM | POA: Diagnosis not present

## 2016-06-19 DIAGNOSIS — R05 Cough: Secondary | ICD-10-CM | POA: Diagnosis not present

## 2016-06-19 DIAGNOSIS — E559 Vitamin D deficiency, unspecified: Secondary | ICD-10-CM | POA: Diagnosis not present

## 2016-06-19 DIAGNOSIS — J040 Acute laryngitis: Secondary | ICD-10-CM | POA: Diagnosis not present

## 2016-08-22 DIAGNOSIS — G4733 Obstructive sleep apnea (adult) (pediatric): Secondary | ICD-10-CM | POA: Diagnosis not present

## 2016-09-19 ENCOUNTER — Ambulatory Visit (INDEPENDENT_AMBULATORY_CARE_PROVIDER_SITE_OTHER): Payer: BLUE CROSS/BLUE SHIELD | Admitting: Women's Health

## 2016-09-19 ENCOUNTER — Encounter: Payer: Self-pay | Admitting: Women's Health

## 2016-09-19 VITALS — BP 130/80 | Ht 63.0 in | Wt 209.0 lb

## 2016-09-19 DIAGNOSIS — Z01419 Encounter for gynecological examination (general) (routine) without abnormal findings: Secondary | ICD-10-CM | POA: Diagnosis not present

## 2016-09-19 NOTE — Patient Instructions (Addendum)
Colonoscopy  Dr Carlean Purl  [place] Maintenance for Postmenopausal Women Menopause is a normal process in which your reproductive ability comes to an end. This process happens gradually over a span of months to years, usually between the ages of 47 and 73. Menopause is complete when you have missed 12 consecutive menstrual periods. It is important to talk with your health care provider about some of the most common conditions that affect postmenopausal women, such as heart disease, cancer, and bone loss (osteoporosis). Adopting a healthy lifestyle and getting preventive care can help to promote your health and wellness. Those actions can also lower your chances of developing some of these common conditions. What should I know about menopause? During menopause, you may experience a number of symptoms, such as:  Moderate-to-severe hot flashes.  Night sweats.  Decrease in sex drive.  Mood swings.  Headaches.  Tiredness.  Irritability.  Memory problems.  Insomnia. Choosing to treat or not to treat menopausal changes is an individual decision that you make with your health care provider. What should I know about hormone replacement therapy and supplements? Hormone therapy products are effective for treating symptoms that are associated with menopause, such as hot flashes and night sweats. Hormone replacement carries certain risks, especially as you become older. If you are thinking about using estrogen or estrogen with progestin treatments, discuss the benefits and risks with your health care provider. What should I know about heart disease and stroke? Heart disease, heart attack, and stroke become more likely as you age. This may be due, in part, to the hormonal changes that your body experiences during menopause. These can affect how your body processes dietary fats, triglycerides, and cholesterol. Heart attack and stroke are both medical emergencies. There are many things that you  can do to help prevent heart disease and stroke:  Have your blood pressure checked at least every 1-2 years. High blood pressure causes heart disease and increases the risk of stroke.  If you are 77-37 years old, ask your health care provider if you should take aspirin to prevent a heart attack or a stroke.  Do not use any tobacco products, including cigarettes, chewing tobacco, or electronic cigarettes. If you need help quitting, ask your health care provider.  It is important to eat a healthy diet and maintain a healthy weight.  Be sure to include plenty of vegetables, fruits, low-fat dairy products, and lean protein.  Avoid eating foods that are high in solid fats, added sugars, or salt (sodium).  Get regular exercise. This is one of the most important things that you can do for your health.  Try to exercise for at least 150 minutes each week. The type of exercise that you do should increase your heart rate and make you sweat. This is known as moderate-intensity exercise.  Try to do strengthening exercises at least twice each week. Do these in addition to the moderate-intensity exercise.  Know your numbers.Ask your health care provider to check your cholesterol and your blood glucose. Continue to have your blood tested as directed by your health care provider. What should I know about cancer screening? There are several types of cancer. Take the following steps to reduce your risk and to catch any cancer development as early as possible. Breast Cancer  Practice breast self-awareness.  This means understanding how your breasts normally appear and feel.  It also means doing regular breast self-exams. Let your health care provider know about any changes, no matter how  small.  If you are 40 or older, have a clinician do a breast exam (clinical breast exam or CBE) every year. Depending on your age, family history, and medical history, it may be recommended that you also have a yearly  breast X-ray (mammogram).  If you have a family history of breast cancer, talk with your health care provider about genetic screening.  If you are at high risk for breast cancer, talk with your health care provider about having an MRI and a mammogram every year.  Breast cancer (BRCA) gene test is recommended for women who have family members with BRCA-related cancers. Results of the assessment will determine the need for genetic counseling and BRCA1 and for BRCA2 testing. BRCA-related cancers include these types:  Breast. This occurs in males or females.  Ovarian.  Tubal. This may also be called fallopian tube cancer.  Cancer of the abdominal or pelvic lining (peritoneal cancer).  Prostate.  Pancreatic. Cervical, Uterine, and Ovarian Cancer  Your health care provider may recommend that you be screened regularly for cancer of the pelvic organs. These include your ovaries, uterus, and vagina. This screening involves a pelvic exam, which includes checking for microscopic changes to the surface of your cervix (Pap test).  For women ages 21-65, health care providers may recommend a pelvic exam and a Pap test every three years. For women ages 95-65, they may recommend the Pap test and pelvic exam, combined with testing for human papilloma virus (HPV), every five years. Some types of HPV increase your risk of cervical cancer. Testing for HPV may also be done on women of any age who have unclear Pap test results.  Other health care providers may not recommend any screening for nonpregnant women who are considered low risk for pelvic cancer and have no symptoms. Ask your health care provider if a screening pelvic exam is right for you.  If you have had past treatment for cervical cancer or a condition that could lead to cancer, you need Pap tests and screening for cancer for at least 20 years after your treatment. If Pap tests have been discontinued for you, your risk factors (such as having a new  sexual partner) need to be reassessed to determine if you should start having screenings again. Some women have medical problems that increase the chance of getting cervical cancer. In these cases, your health care provider may recommend that you have screening and Pap tests more often.  If you have a family history of uterine cancer or ovarian cancer, talk with your health care provider about genetic screening.  If you have vaginal bleeding after reaching menopause, tell your health care provider.  There are currently no reliable tests available to screen for ovarian cancer. Lung Cancer  Lung cancer screening is recommended for adults 5-11 years old who are at high risk for lung cancer because of a history of smoking. A yearly low-dose CT scan of the lungs is recommended if you:  Currently smoke.  Have a history of at least 30 pack-years of smoking and you currently smoke or have quit within the past 15 years. A pack-year is smoking an average of one pack of cigarettes per day for one year. Yearly screening should:  Continue until it has been 15 years since you quit.  Stop if you develop a health problem that would prevent you from having lung cancer treatment. Colorectal Cancer  This type of cancer can be detected and can often be prevented.  Routine colorectal cancer  screening usually begins at age 66 and continues through age 45.  If you have risk factors for colon cancer, your health care provider may recommend that you be screened at an earlier age.  If you have a family history of colorectal cancer, talk with your health care provider about genetic screening.  Your health care provider may also recommend using home test kits to check for hidden blood in your stool.  A small camera at the end of a tube can be used to examine your colon directly (sigmoidoscopy or colonoscopy). This is done to check for the earliest forms of colorectal cancer.  Direct examination of the colon  should be repeated every 5-10 years until age 25. However, if early forms of precancerous polyps or small growths are found or if you have a family history or genetic risk for colorectal cancer, you may need to be screened more often. Skin Cancer  Check your skin from head to toe regularly.  Monitor any moles. Be sure to tell your health care provider:  About any new moles or changes in moles, especially if there is a change in a mole's shape or color.  If you have a mole that is larger than the size of a pencil eraser.  If any of your family members has a history of skin cancer, especially at a Kenyon Eichelberger age, talk with your health care provider about genetic screening.  Always use sunscreen. Apply sunscreen liberally and repeatedly throughout the day.  Whenever you are outside, protect yourself by wearing long sleeves, pants, a wide-brimmed hat, and sunglasses. What should I know about osteoporosis? Osteoporosis is a condition in which bone destruction happens more quickly than new bone creation. After menopause, you may be at an increased risk for osteoporosis. To help prevent osteoporosis or the bone fractures that can happen because of osteoporosis, the following is recommended:  If you are 30-65 years old, get at least 1,000 mg of calcium and at least 600 mg of vitamin D per day.  If you are older than age 44 but younger than age 35, get at least 1,200 mg of calcium and at least 600 mg of vitamin D per day.  If you are older than age 28, get at least 1,200 mg of calcium and at least 800 mg of vitamin D per day. Smoking and excessive alcohol intake increase the risk of osteoporosis. Eat foods that are rich in calcium and vitamin D, and do weight-bearing exercises several times each week as directed by your health care provider. What should I know about how menopause affects my mental health? Depression may occur at any age, but it is more common as you become older. Common symptoms of  depression include:  Low or sad mood.  Changes in sleep patterns.  Changes in appetite or eating patterns.  Feeling an overall lack of motivation or enjoyment of activities that you previously enjoyed.  Frequent crying spells. Talk with your health care provider if you think that you are experiencing depression. What should I know about immunizations? It is important that you get and maintain your immunizations. These include:  Tetanus, diphtheria, and pertussis (Tdap) booster vaccine.  Influenza every year before the flu season begins.  Pneumonia vaccine.  Shingles vaccine. Your health care provider may also recommend other immunizations. This information is not intended to replace advice given to you by your health care provider. Make sure you discuss any questions you have with your health care provider. Document Released: 07/06/2005 Document  Revised: 12/02/2015 Document Reviewed: 02/15/2015 Elsevier Interactive Patient Education  2017 [place] for Diabetes Mellitus, Adult Carbohydrate counting is a method for keeping track of how many carbohydrates you eat. Eating carbohydrates naturally increases the amount of sugar (glucose) in the blood. Counting how many carbohydrates you eat helps keep your blood glucose within normal limits, which helps you manage your diabetes (diabetes mellitus). It is important to know how many carbohydrates you can safely have in each meal. This is different for every person. A diet and nutrition specialist (registered dietitian) can help you make a meal plan and calculate how many carbohydrates you should have at each meal and snack. Carbohydrates are found in the following foods:  Grains, such as breads and cereals.  Dried beans and soy products.  Starchy vegetables, such as potatoes, peas, and corn.  Fruit and fruit juices.  Milk and yogurt.  Sweets and snack foods, such as cake, cookies, candy, chips, and soft  drinks. How do I count carbohydrates? There are two ways to count carbohydrates in food. You can use either of the methods or a combination of both. Reading "Nutrition Facts" on packaged food  The "Nutrition Facts" list is included on the labels of almost all packaged foods and beverages in the [place] It includes:  The serving size.  Information about nutrients in each serving, including the grams (g) of carbohydrate per serving. To use the "Nutrition Facts":  Decide how many servings you will have.  Multiply the number of servings by the number of carbohydrates per serving.  The resulting number is the total amount of carbohydrates that you will be having. Learning standard serving sizes of other foods  When you eat foods containing carbohydrates that are not packaged or do not include "Nutrition Facts" on the label, you need to measure the servings in order to count the amount of carbohydrates:  Measure the foods that you will eat with a food scale or measuring cup, if needed.  Decide how many standard-size servings you will eat.  Multiply the number of servings by 15. Most carbohydrate-rich foods have about 15 g of carbohydrates per serving.  For example, if you eat 8 oz (170 g) of strawberries, you will have eaten 2 servings and 30 g of carbohydrates (2 servings x 15 g = 30 g).  For foods that have more than one food mixed, such as soups and casseroles, you must count the carbohydrates in each food that is included. The following list contains standard serving sizes of common carbohydrate-rich foods. Each of these servings has about 15 g of carbohydrates:   hamburger bun or  English muffin.   oz (15 mL) syrup.   oz (14 g) jelly.  1 slice of bread.  1 six-inch tortilla.  3 oz (85 g) cooked rice or pasta.  4 oz (113 g) cooked dried beans.  4 oz (113 g) starchy vegetable, such as peas, corn, or potatoes.  4 oz (113 g) hot cereal.  4 oz (113 g) mashed potatoes or   of a large baked potato.  4 oz (113 g) canned or frozen fruit.  4 oz (120 mL) fruit juice.  4-6 crackers.  6 chicken nuggets.  6 oz (170 g) unsweetened dry cereal.  6 oz (170 g) plain fat-free yogurt or yogurt sweetened with artificial sweeteners.  8 oz (240 mL) milk.  8 oz (170 g) fresh fruit or one small piece of fruit.  24 oz (680 g) popped popcorn. Example of  carbohydrate counting Sample meal   3 oz (85 g) chicken breast.  6 oz (170 g) brown rice.  4 oz (113 g) corn.  8 oz (240 mL) milk.  8 oz (170 g) strawberries with sugar-free whipped topping. Carbohydrate calculation  1. Identify the foods that contain carbohydrates:  Rice.  Corn.  Milk.  Strawberries. 2. Calculate how many servings you have of each food:  2 servings rice.  1 serving corn.  1 serving milk.  1 serving strawberries. 3. Multiply each number of servings by 15 g:  2 servings rice x 15 g = 30 g.  1 serving corn x 15 g = 15 g.  1 serving milk x 15 g = 15 g.  1 serving strawberries x 15 g = 15 g. 4. Add together all of the amounts to find the total grams of carbohydrates eaten:  30 g + 15 g + 15 g + 15 g = 75 g of carbohydrates total. This information is not intended to replace advice given to you by your health care provider. Make sure you discuss any questions you have with your health care provider. Document Released: 05/14/2005 Document Revised: 12/02/2015 Document Reviewed: 10/26/2015 Elsevier Interactive Patient Education  2017 [employer].

## 2016-09-19 NOTE — Progress Notes (Signed)
Debra Combs 1966/01/11 [id number]    History:    Presents for annual exam. No cycles for 4 months cycles, husband vasectomy, some menopausal symptoms. 2007 ablation for heavy cycles. 1997 cryo-with normal Paps after. [facility] primary care. Normal mammogram history. History of a 3 cm fibroid.  Past medical history, past surgical history, family history and social history were all reviewed and documented in the EPIC chart. Works from home, daughter 50, son 50 both doing well.   ROS:  A ROS was performed and pertinent positives and negatives are included.  Exam:  Vitals:   09/19/16 1119  BP: 130/80  Weight: 209 lb (94.8 kg)  Height: 5\' 3"  (1.6 m)   Body mass index is 37.02 kg/m.   General appearance:  Normal Thyroid:  Symmetrical, normal in size, without palpable masses or nodularity. Respiratory  Auscultation:  Clear without wheezing or rhonchi Cardiovascular  Auscultation:  Regular rate, without rubs, murmurs or gallops  Edema/varicosities:  Not grossly evident Abdominal  Soft,nontender, without masses, guarding or rebound.  Liver/spleen:  No organomegaly noted  Hernia:  None appreciated  Skin  Inspection:  Grossly normal   Breasts: Examined lying and sitting.     Right: Without masses, retractions, discharge or axillary adenopathy.     Left: Without masses, retractions, discharge or axillary adenopathy. Gentitourinary   Inguinal/mons:  Normal without inguinal adenopathy  External genitalia:  Normal  BUS/Urethra/Skene's glands:  Normal  Vagina:  Normal  Cervix:  Normal  Uterus:   normal in size, shape and contour.  Midline and mobile  Adnexa/parametria:     Rt: Without masses or tenderness.   Lt: Without masses or tenderness.  Anus and perineum: Normal  Digital rectal exam: Normal sphincter tone without palpated masses or tenderness  Assessment/Plan:  51 y.o.   51 y.o. MBF G2 P2 for annual exam with no complaints.  Perimenopausal/vasectomy/2007 ablation Fibroid  uterus 1997 cryo-with normal Paps after Obesity [facility]-primary care  Plan: Menopause reviewed. SBE's, annual screening mammogram continue 3-D. Exercise, calcium rich diet, vitamin D 2000 daily encouraged. Encouraged increased exercise and decrease calories for weight loss. Colonoscopy at age 37, [facility]  informations provided. Pap normal 2016 with negative HR HPV, new screening guidelines reviewed.     [place], 11:56 AM 09/19/2016

## 2016-09-20 LAB — URINALYSIS W MICROSCOPIC + REFLEX CULTURE
BACTERIA UA: NONE SEEN [HPF]
Bilirubin Urine: NEGATIVE
CASTS: NONE SEEN [LPF]
Crystals: NONE SEEN [HPF]
GLUCOSE, UA: NEGATIVE
HGB URINE DIPSTICK: NEGATIVE
KETONES UR: NEGATIVE
Leukocytes, UA: NEGATIVE
Nitrite: NEGATIVE
PROTEIN: NEGATIVE
RBC / HPF: NONE SEEN RBC/HPF (ref <=2)
Specific Gravity, Urine: 1.016 (ref 1.001–1.035)
WBC, UA: NONE SEEN WBC/HPF (ref <=5)
YEAST: NONE SEEN [HPF]
pH: 7.5 (ref 5.0–8.0)

## 2016-10-19 DIAGNOSIS — G4733 Obstructive sleep apnea (adult) (pediatric): Secondary | ICD-10-CM | POA: Diagnosis not present

## 2016-12-24 DIAGNOSIS — K219 Gastro-esophageal reflux disease without esophagitis: Secondary | ICD-10-CM | POA: Diagnosis not present

## 2017-01-21 DIAGNOSIS — M79669 Pain in unspecified lower leg: Secondary | ICD-10-CM | POA: Diagnosis not present

## 2017-01-21 DIAGNOSIS — R609 Edema, unspecified: Secondary | ICD-10-CM | POA: Diagnosis not present

## 2017-01-21 DIAGNOSIS — Z131 Encounter for screening for diabetes mellitus: Secondary | ICD-10-CM | POA: Diagnosis not present

## 2017-01-21 DIAGNOSIS — M5440 Lumbago with sciatica, unspecified side: Secondary | ICD-10-CM | POA: Diagnosis not present

## 2017-02-25 DIAGNOSIS — K219 Gastro-esophageal reflux disease without esophagitis: Secondary | ICD-10-CM | POA: Diagnosis not present

## 2017-02-25 DIAGNOSIS — J3089 Other allergic rhinitis: Secondary | ICD-10-CM | POA: Diagnosis not present

## 2017-03-08 DIAGNOSIS — G4733 Obstructive sleep apnea (adult) (pediatric): Secondary | ICD-10-CM | POA: Diagnosis not present

## 2017-03-29 ENCOUNTER — Encounter: Payer: Self-pay | Admitting: Podiatry

## 2017-03-29 ENCOUNTER — Ambulatory Visit (INDEPENDENT_AMBULATORY_CARE_PROVIDER_SITE_OTHER): Payer: BLUE CROSS/BLUE SHIELD

## 2017-03-29 ENCOUNTER — Ambulatory Visit (INDEPENDENT_AMBULATORY_CARE_PROVIDER_SITE_OTHER): Payer: BLUE CROSS/BLUE SHIELD | Admitting: Podiatry

## 2017-03-29 DIAGNOSIS — L600 Ingrowing nail: Secondary | ICD-10-CM

## 2017-03-29 DIAGNOSIS — M19071 Primary osteoarthritis, right ankle and foot: Secondary | ICD-10-CM | POA: Diagnosis not present

## 2017-03-29 DIAGNOSIS — M659 Synovitis and tenosynovitis, unspecified: Secondary | ICD-10-CM | POA: Diagnosis not present

## 2017-03-29 DIAGNOSIS — L603 Nail dystrophy: Secondary | ICD-10-CM | POA: Diagnosis not present

## 2017-03-29 DIAGNOSIS — B351 Tinea unguium: Secondary | ICD-10-CM | POA: Diagnosis not present

## 2017-03-29 MED ORDER — CEPHALEXIN 500 MG PO CAPS: 500.0000 mg | ORAL_CAPSULE | Freq: Three times a day (TID) | ORAL | 0 refills | Status: DC

## 2017-03-29 NOTE — Patient Instructions (Signed)

## 2017-03-29 NOTE — Progress Notes (Signed)
Subjective:    Patient ID: Debra Combs, female    DOB: 29-Jul-1965, 51 y.o.   MRN: [id number]  HPI 51 year old female presents the occipital concerns for chronic ingrown toenails to both of her big toes which is been ongoing for quite some time. She states that they hurt with pressure in shoes but sometimes even at night with a sheet along the area causes discomfort. She denies any swelling or redness or any drainage or pus coming from the area. She also gets some pain to the big toe on the right foot. She states that a year ago she felt a pop in the hammertoe and she had x-rays which not reveal any fracture. Since that she occasionally gets discomfort of the big toe joint on the right side. She's had no recent treatment for this. She did wear a boot for some time after the initial injury but symptoms continue. She has no other concerns.  Review of Systems  All other systems reviewed and are negative.  Past Medical History:  Diagnosis Date  . Acne   . Anxiety   . Asthma   . Breast cyst    left breast  . Cervical dysplasia 1997  . Dizziness   . GERD (gastroesophageal reflux disease)   . HA (headache)   . Post-concussion headache 07/21/2014    Past Surgical History:  Procedure Laterality Date  . ANKLE SURGERY  1983   Left  . BREAST CYST EXCISION  06/08/11   left breast  . DILATION AND CURETTAGE OF UTERUS    . ENDOMETRIAL ABLATION  2008   Her Option  . GYNECOLOGIC CRYOSURGERY  1997     Current Outpatient Medications:  .  ALBUTEROL IN, Inhale into the lungs.  , Disp: , Rfl:  .  aspirin-acetaminophen-caffeine (EXCEDRIN MIGRAINE) 250-250-65 MG per tablet, Take 1 tablet by mouth every 6 (six) hours as needed for headache., Disp: , Rfl:  .  cephALEXin (KEFLEX) 500 MG capsule, Take 1 capsule (500 mg total) by mouth 3 (three) times daily., Disp: 30 capsule, Rfl: 0 .  Cholecalciferol (VITAMIN D PO), Take by mouth., Disp: , Rfl:  .  Fluticasone Furoate-Vilanterol (BREO ELLIPTA IN),  Inhale into the lungs., Disp: , Rfl:  .  montelukast (SINGULAIR) 10 MG tablet, Take 10 mg by mouth at bedtime., Disp: , Rfl:  .  omeprazole (PRILOSEC) 20 MG capsule, Take 20 mg by mouth daily., Disp: , Rfl:  .  spironolactone (ALDACTONE) 25 MG tablet, Take 25 mg by mouth daily.  , Disp: , Rfl:  .  topiramate (TOPAMAX) 25 MG tablet, Take 1 tablet (25 mg total) by mouth daily. (Patient taking differently: Take 25 mg by mouth as needed. ), Disp: 90 tablet, Rfl: 3  Allergies  Allergen Reactions  . Avelox [Moxifloxacin Hcl In Nacl]   . Nitrofurantoin Monohyd Macro   . Sulfa Antibiotics   . Zithromax [Azithromycin Dihydrate] Nausea And Vomiting  . Shrimp [Shellfish Allergy] Rash    Social History   Socioeconomic History  . Marital status: Married    Spouse name: [name]  . Number of children: 2  . Years of education: college  . Highest education level: Not on file  Social Needs  . Financial resource strain: Not on file  . Food insecurity - worry: Not on file  . Food insecurity - inability: Not on file  . Transportation needs - medical: Not on file  . Transportation needs - non-medical: Not on file  Occupational History  Comment: [employer]   Tobacco Use  . Smoking status: Never Smoker  . Smokeless tobacco: Never Used  Substance and Sexual Activity  . Alcohol use: No    Alcohol/week: 0.0 oz  . Drug use: No  . Sexual activity: Yes    Birth control/protection: Other-see comments    Comment: VASECTOMY, INTERCOUSE AGE 58, LESS THAN 5 SEXUAL PARTNERS  Other Topics Concern  . Not on file  Social History Narrative   Patient lives at home with her husband [name]) and her two children.   Patient works full time from home for [employer].   Education college.   Right handed.   Caffeine 32 oz daily.        Objective:   Physical Exam  General: AAO x3, NAD  Dermatological: There is incurvation present on both the medial and lateral aspects of bilateral hallux toenails tenderness to  palpation of the nail corners. There is localized edema there is no erythema, drainage or pus or any clinical signs of infection present. No open lesions or pre-ulcerative lesions within for this time.  Vascular: Dorsalis Pedis artery and Posterior Tibial artery pedal pulses are 2/4 bilateral with immedate capillary fill time. There is no pain with calf compression, swelling, warmth, erythema.   Neruologic: Grossly intact via light touch bilateral.  Protective threshold with Semmes Wienstein monofilament intact to all pedal sites bilateral.   Musculoskeletal: Mild discomfort at the right first MTPJ there is decrease in gym as the first MPJ. There is no overlying edema, erythema, increase in warmth. There is no other areas of tenderness of the foot this time the toenails. Muscular strength 5/5 in all groups tested bilateral.  Gait: Unassisted, Nonantalgic.     Assessment & Plan:   51 year old female with symptomatic ingrown chronic toenails bilaterally; right hallux limitus -Treatment options discussed including all alternatives, risks, and complications -Etiology of symptoms were discussed -X-rays were obtained and reviewed with the patient. Arthritic changes present of the first MPJ on the right side. There is no evidence of acute fracture identified. -In regards the hallux limitus discussed shoe gear modifications, inserts, injections but she wishes to Injection today. -At this time, the patient is requesting partial nail removal with chemical matricectomy to the symptomatic portion of the nail. Risks and complications were discussed with the patient for which they understand and written consent was obtained. Under sterile conditions a total of 3 mL of a mixture of 2% lidocaine plain and 0.5% Marcaine plain was infiltrated in a hallux block fashion. Once anesthetized, the skin was prepped in sterile fashion. A tourniquet was then applied. Next the medial and lateral aspect of hallux nail border  was then sharply excised making sure to remove the entire offending nail border. Once the nails were ensured to be removed area was debrided and the underlying skin was intact. There is no purulence identified in the procedure. Next phenol was then applied under standard conditions and copiously irrigated. Silvadene was applied. A dry sterile dressing was applied. After application of the dressing the tourniquet was removed and there is found to be an immediate capillary refill time to the digit. The patient tolerated the procedure well any complications. Post procedure instructions were discussed the patient for which he verbally understood. Follow-up in one week for nail check or sooner if any problems are to arise. Discussed signs/symptoms of infection and directed to call the office immediately should any occur or go directly to the emergency room. In the meantime, encouraged to  call the office with any questions, concerns, changes symptoms.  Celesta Gentile, DPM

## 2017-04-04 DIAGNOSIS — L700 Acne vulgaris: Secondary | ICD-10-CM | POA: Diagnosis not present

## 2017-04-05 ENCOUNTER — Ambulatory Visit (INDEPENDENT_AMBULATORY_CARE_PROVIDER_SITE_OTHER): Payer: BLUE CROSS/BLUE SHIELD | Admitting: Podiatry

## 2017-04-05 DIAGNOSIS — Z79899 Other long term (current) drug therapy: Secondary | ICD-10-CM | POA: Diagnosis not present

## 2017-04-05 DIAGNOSIS — L600 Ingrowing nail: Secondary | ICD-10-CM

## 2017-04-05 DIAGNOSIS — L700 Acne vulgaris: Secondary | ICD-10-CM | POA: Diagnosis not present

## 2017-04-05 DIAGNOSIS — Z5181 Encounter for therapeutic drug level monitoring: Secondary | ICD-10-CM | POA: Diagnosis not present

## 2017-04-05 DIAGNOSIS — Z9889 Other specified postprocedural states: Secondary | ICD-10-CM

## 2017-04-05 NOTE — Patient Instructions (Signed)

## 2017-04-08 NOTE — Progress Notes (Signed)
Subjective: Debra Combs is a 51 y.o.  female returns to office today for follow up evaluation after having bilateral Hallux medial and lateral partial nail avulsion performed. Patient has been soaking using epsom salts and applying topical antibiotic covered with bandaid daily. She states that she is doing better and her pain is improving. She has noticed a small amount of clear drainage but no pus. Patient denies fevers, chills, nausea, vomiting. Denies any calf pain, chest pain, SOB.   Objective:  Vitals: Reviewed  General: Well developed, nourished, in no acute distress, alert and oriented x3   Dermatology: Skin is warm, dry and supple bilateral. Bilateral hallux nail border appears to be clean, dry, with mild granular tissue and surrounding scab. There is no surrounding erythema, edema, drainage/purulence. The remaining nails appear unremarkable at this time. There are no other lesions or other signs of infection present.  Neurovascular status: Intact. No lower extremity swelling; No pain with calf compression bilateral.  Musculoskeletal: Decreased tenderness to palpation of the medial and lateral hallux nail folds. Muscular strength within normal limits bilateral.   Assesement and Plan: S/p partial nail avulsion, doing well.   -Continue soaking in epsom salts twice a day followed by antibiotic ointment and a band-aid. Can leave uncovered at night. Continue this until completely healed.  -If the area has not healed in 2 weeks, call the office for follow-up appointment, or sooner if any problems arise.  -Monitor for any signs/symptoms of infection. Call the office immediately if any occur or go directly to the emergency room. Call with any questions/concerns.  Celesta Gentile, DPM

## 2017-04-22 ENCOUNTER — Encounter: Payer: Self-pay | Admitting: Gynecology

## 2017-04-22 DIAGNOSIS — Z1231 Encounter for screening mammogram for malignant neoplasm of breast: Secondary | ICD-10-CM | POA: Diagnosis not present

## 2017-04-23 ENCOUNTER — Telehealth: Payer: Self-pay | Admitting: *Deleted

## 2017-04-23 NOTE — Telephone Encounter (Signed)
I informed pt of DR. Wagoner's review of results and recommendation. I informed pt of the daily use and cost of [id number].

## 2017-04-23 NOTE — Telephone Encounter (Signed)
-----   Message from Trula Slade, DPM sent at 04/17/2017  1:08 PM EST ----- Negative for fungus- please have her try urea if she would like. Thanks.

## 2017-06-04 ENCOUNTER — Ambulatory Visit
Admission: RE | Admit: 2017-06-04 | Discharge: 2017-06-04 | Disposition: A | Discharge status: Home / Self Care | Payer: BLUE CROSS/BLUE SHIELD | Source: Ambulatory Visit | Attending: Internal Medicine | Admitting: Internal Medicine

## 2017-06-04 ENCOUNTER — Other Ambulatory Visit: Payer: Self-pay | Admitting: Internal Medicine

## 2017-06-04 DIAGNOSIS — M549 Dorsalgia, unspecified: Secondary | ICD-10-CM

## 2017-06-04 DIAGNOSIS — L729 Follicular cyst of the skin and subcutaneous tissue, unspecified: Secondary | ICD-10-CM | POA: Diagnosis not present

## 2017-06-04 DIAGNOSIS — M79659 Pain in unspecified thigh: Secondary | ICD-10-CM | POA: Diagnosis not present

## 2017-06-04 DIAGNOSIS — R102 Pelvic and perineal pain: Secondary | ICD-10-CM | POA: Diagnosis not present

## 2017-06-12 DIAGNOSIS — M25552 Pain in left hip: Secondary | ICD-10-CM | POA: Diagnosis not present

## 2017-06-12 DIAGNOSIS — M545 Low back pain: Secondary | ICD-10-CM | POA: Diagnosis not present

## 2017-06-12 DIAGNOSIS — M25551 Pain in right hip: Secondary | ICD-10-CM | POA: Diagnosis not present

## 2017-06-24 DIAGNOSIS — M25551 Pain in right hip: Secondary | ICD-10-CM | POA: Diagnosis not present

## 2017-06-24 DIAGNOSIS — M545 Low back pain: Secondary | ICD-10-CM | POA: Diagnosis not present

## 2017-06-24 DIAGNOSIS — M25552 Pain in left hip: Secondary | ICD-10-CM | POA: Diagnosis not present

## 2017-06-27 DIAGNOSIS — M545 Low back pain: Secondary | ICD-10-CM | POA: Diagnosis not present

## 2017-06-27 DIAGNOSIS — M25552 Pain in left hip: Secondary | ICD-10-CM | POA: Diagnosis not present

## 2017-06-27 DIAGNOSIS — M25551 Pain in right hip: Secondary | ICD-10-CM | POA: Diagnosis not present

## 2017-07-01 DIAGNOSIS — M25552 Pain in left hip: Secondary | ICD-10-CM | POA: Diagnosis not present

## 2017-07-01 DIAGNOSIS — M25551 Pain in right hip: Secondary | ICD-10-CM | POA: Diagnosis not present

## 2017-07-01 DIAGNOSIS — M545 Low back pain: Secondary | ICD-10-CM | POA: Diagnosis not present

## 2017-07-03 DIAGNOSIS — M25551 Pain in right hip: Secondary | ICD-10-CM | POA: Diagnosis not present

## 2017-07-03 DIAGNOSIS — M545 Low back pain: Secondary | ICD-10-CM | POA: Diagnosis not present

## 2017-07-03 DIAGNOSIS — M25552 Pain in left hip: Secondary | ICD-10-CM | POA: Diagnosis not present

## 2017-07-08 DIAGNOSIS — M25552 Pain in left hip: Secondary | ICD-10-CM | POA: Diagnosis not present

## 2017-07-08 DIAGNOSIS — M545 Low back pain: Secondary | ICD-10-CM | POA: Diagnosis not present

## 2017-07-08 DIAGNOSIS — M25551 Pain in right hip: Secondary | ICD-10-CM | POA: Diagnosis not present

## 2017-07-11 DIAGNOSIS — M545 Low back pain: Secondary | ICD-10-CM | POA: Diagnosis not present

## 2017-07-11 DIAGNOSIS — M25552 Pain in left hip: Secondary | ICD-10-CM | POA: Diagnosis not present

## 2017-07-11 DIAGNOSIS — M25551 Pain in right hip: Secondary | ICD-10-CM | POA: Diagnosis not present

## 2017-07-15 DIAGNOSIS — M25552 Pain in left hip: Secondary | ICD-10-CM | POA: Diagnosis not present

## 2017-07-15 DIAGNOSIS — M545 Low back pain: Secondary | ICD-10-CM | POA: Diagnosis not present

## 2017-07-15 DIAGNOSIS — M25551 Pain in right hip: Secondary | ICD-10-CM | POA: Diagnosis not present

## 2017-07-22 DIAGNOSIS — M25551 Pain in right hip: Secondary | ICD-10-CM | POA: Diagnosis not present

## 2017-07-22 DIAGNOSIS — M25552 Pain in left hip: Secondary | ICD-10-CM | POA: Diagnosis not present

## 2017-07-22 DIAGNOSIS — M545 Low back pain: Secondary | ICD-10-CM | POA: Diagnosis not present

## 2017-07-25 DIAGNOSIS — M25551 Pain in right hip: Secondary | ICD-10-CM | POA: Diagnosis not present

## 2017-07-25 DIAGNOSIS — M25552 Pain in left hip: Secondary | ICD-10-CM | POA: Diagnosis not present

## 2017-07-25 DIAGNOSIS — M545 Low back pain: Secondary | ICD-10-CM | POA: Diagnosis not present

## 2017-08-02 DIAGNOSIS — E559 Vitamin D deficiency, unspecified: Secondary | ICD-10-CM | POA: Diagnosis not present

## 2017-08-02 DIAGNOSIS — Z0000 Encounter for general adult medical examination without abnormal findings: Secondary | ICD-10-CM | POA: Diagnosis not present

## 2017-08-02 DIAGNOSIS — Z1389 Encounter for screening for other disorder: Secondary | ICD-10-CM | POA: Diagnosis not present

## 2017-08-07 DIAGNOSIS — M25552 Pain in left hip: Secondary | ICD-10-CM | POA: Diagnosis not present

## 2017-08-07 DIAGNOSIS — M25551 Pain in right hip: Secondary | ICD-10-CM | POA: Diagnosis not present

## 2017-08-07 DIAGNOSIS — M545 Low back pain: Secondary | ICD-10-CM | POA: Diagnosis not present

## 2017-09-13 DIAGNOSIS — G4733 Obstructive sleep apnea (adult) (pediatric): Secondary | ICD-10-CM | POA: Diagnosis not present

## 2017-09-23 ENCOUNTER — Encounter: Payer: BLUE CROSS/BLUE SHIELD | Admitting: Women's Health

## 2017-10-15 ENCOUNTER — Ambulatory Visit (INDEPENDENT_AMBULATORY_CARE_PROVIDER_SITE_OTHER): Payer: BLUE CROSS/BLUE SHIELD | Admitting: Women's Health

## 2017-10-15 ENCOUNTER — Encounter: Payer: Self-pay | Admitting: Women's Health

## 2017-10-15 VITALS — BP 120/74 | Ht 63.0 in | Wt 192.0 lb

## 2017-10-15 DIAGNOSIS — Z01419 Encounter for gynecological examination (general) (routine) without abnormal findings: Secondary | ICD-10-CM

## 2017-10-15 DIAGNOSIS — R87610 Atypical squamous cells of undetermined significance on cytologic smear of cervix (ASC-US): Secondary | ICD-10-CM | POA: Diagnosis not present

## 2017-10-15 DIAGNOSIS — Z1151 Encounter for screening for human papillomavirus (HPV): Secondary | ICD-10-CM | POA: Diagnosis not present

## 2017-10-15 NOTE — Patient Instructions (Signed)

## 2017-10-15 NOTE — Progress Notes (Signed)
Debra Combs February 16, 1966 [id number]    History:    Presents for annual exam.  Irregular cycles every 2 to 5 months, vasectomy.  2007 endometrial ablation.  1997 cryo with normal Paps after.  Normal mammogram history.  Scheduled for screening colonoscopy next month.  GERD,  asthma primary care manages labs and meds.  Past medical history, past surgical history, family history and social history were all reviewed and documented in the EPIC chart.  Works from home.  Daughter 34 , son 17  both doing well.  Mother esophageal cancer, diabetes, hypertension deceased, father deceased, esophageal cancer  ROS:  A ROS was performed and pertinent positives and negatives are included.  Exam:  Vitals:   10/15/17 1043  BP: 120/74  Weight: 192 lb (87.1 kg)  Height: 5\' 3"  (1.6 m)   Body mass index is 34.01 kg/m.   General appearance:  Normal Thyroid:  Symmetrical, normal in size, without palpable masses or nodularity. Respiratory  Auscultation:  Clear without wheezing or rhonchi Cardiovascular  Auscultation:  Regular rate, without rubs, murmurs or gallops  Edema/varicosities:  Not grossly evident Abdominal  Soft,nontender, without masses, guarding or rebound.  Liver/spleen:  No organomegaly noted  Hernia:  None appreciated  Skin  Inspection:  Grossly normal   Breasts: Examined lying and sitting.     Right: Without masses, retractions, discharge or axillary adenopathy.     Left: Without masses, retractions, discharge or axillary adenopathy. Gentitourinary   Inguinal/mons:  Normal without inguinal adenopathy  External genitalia:  Normal  BUS/Urethra/Skene's glands:  Normal  Vagina:  Normal  Cervix:  Normal  Uterus:  normal in size, shape and contour.  Midline and mobile  Adnexa/parametria:     Rt: Without masses or tenderness.   Lt: Without masses or tenderness.  Anus and perineum: Normal  Digital rectal exam: Normal sphincter tone without palpated masses or  tenderness  Assessment/Plan:  52 y.o. MBF G6, P2 for annual exam with no complaints other than occasional hip pain.  Perimenopausal/vasectomy 2007 endometrial ablation 1997 cryo with normal Paps after Asthma, GERD-meds and labs-primary care  Plan: Has had some bilateral hip pain that is intermittent,  will follow up with primary care.  Doing boot camp, reviewed modifying as needed, yoga encouraged.  SBE's, continue annual screening mammogram, calcium rich foods, vitamin D 2000 daily encouraged.  Menopause reviewed, symptoms minimal.  Pap with HR HPV typing, new screening guidelines reviewed.    [place], 11:49 AM 10/15/2017

## 2017-10-16 LAB — PAP, TP IMAGING W/ HPV RNA, RFLX HPV TYPE 16,18/45: HPV DNA High Risk: NOT DETECTED

## 2017-11-08 DIAGNOSIS — D126 Benign neoplasm of colon, unspecified: Secondary | ICD-10-CM | POA: Diagnosis not present

## 2017-11-08 DIAGNOSIS — Z1211 Encounter for screening for malignant neoplasm of colon: Secondary | ICD-10-CM | POA: Diagnosis not present

## 2017-11-08 DIAGNOSIS — K648 Other hemorrhoids: Secondary | ICD-10-CM | POA: Diagnosis not present

## 2017-11-12 DIAGNOSIS — D126 Benign neoplasm of colon, unspecified: Secondary | ICD-10-CM | POA: Diagnosis not present

## 2017-11-12 DIAGNOSIS — Z1211 Encounter for screening for malignant neoplasm of colon: Secondary | ICD-10-CM | POA: Diagnosis not present

## 2018-01-13 DIAGNOSIS — G4733 Obstructive sleep apnea (adult) (pediatric): Secondary | ICD-10-CM | POA: Diagnosis not present

## 2018-01-22 IMAGING — CR DG PELVIS 1-2V
1 series · 1 of 1 positions shown · non-contrast
Comparison: None.

CLINICAL DATA: Posterior pelvic pain common no known injury,
initial encounter

EXAM:
PELVIS - 1-2 VIEW

[t pelvis a.p.]
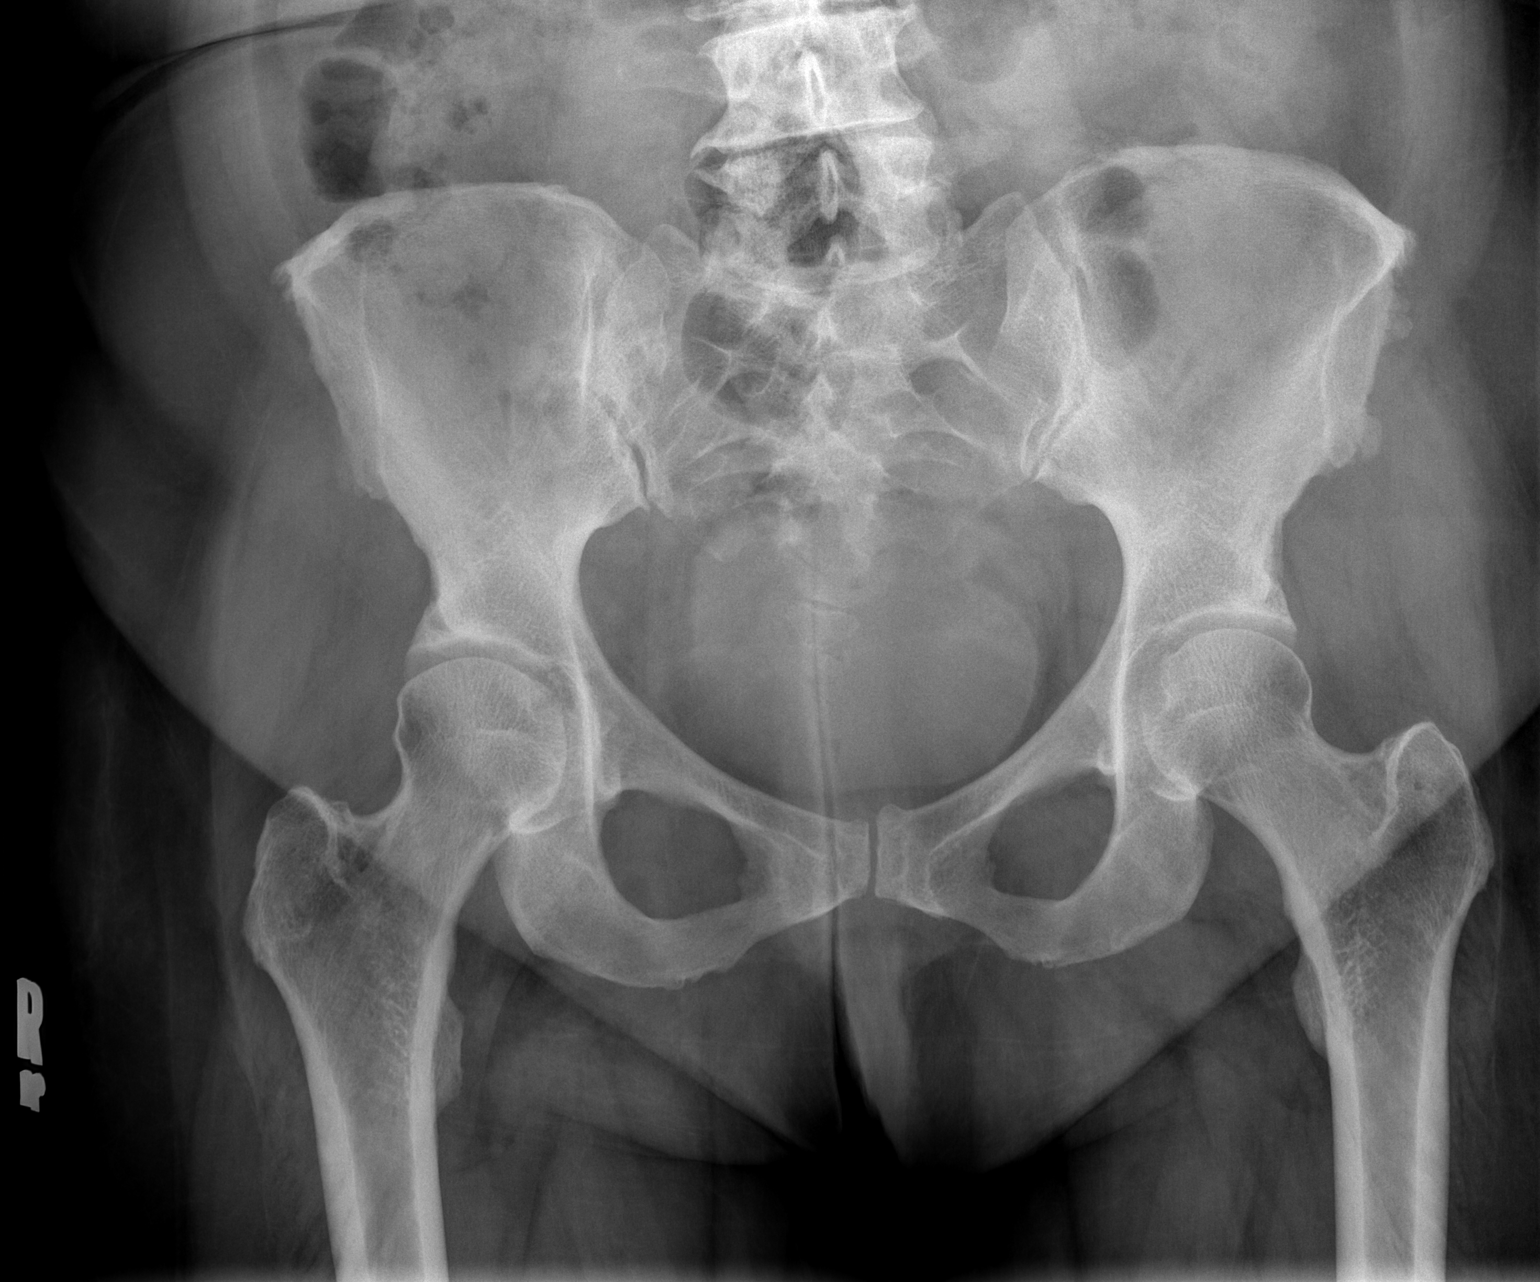

[1 of 1 positions shown; findings below may reference images not displayed]

FINDINGS: Pelvic ring is intact. Mild degenerative changes of the lumbar spine
are seen. No acute fracture or dislocation is noted. No soft tissue
changes are seen.
IMPRESSION: No acute abnormality noted.

## 2018-01-31 DIAGNOSIS — L700 Acne vulgaris: Secondary | ICD-10-CM | POA: Diagnosis not present

## 2018-02-27 ENCOUNTER — Emergency Department (HOSPITAL_COMMUNITY): Payer: BLUE CROSS/BLUE SHIELD

## 2018-02-27 ENCOUNTER — Emergency Department (HOSPITAL_COMMUNITY)
Admission: EM | Admit: 2018-02-27 | Discharge: 2018-02-27 | Disposition: A | Discharge status: Home / Self Care | Payer: BLUE CROSS/BLUE SHIELD | Attending: Emergency Medicine | Admitting: Emergency Medicine

## 2018-02-27 ENCOUNTER — Encounter (HOSPITAL_COMMUNITY): Payer: Self-pay | Admitting: Emergency Medicine

## 2018-02-27 ENCOUNTER — Other Ambulatory Visit: Payer: Self-pay

## 2018-02-27 DIAGNOSIS — R200 Anesthesia of skin: Secondary | ICD-10-CM | POA: Diagnosis not present

## 2018-02-27 DIAGNOSIS — J45909 Unspecified asthma, uncomplicated: Secondary | ICD-10-CM | POA: Insufficient documentation

## 2018-02-27 DIAGNOSIS — R0789 Other chest pain: Secondary | ICD-10-CM | POA: Diagnosis not present

## 2018-02-27 DIAGNOSIS — Z79899 Other long term (current) drug therapy: Secondary | ICD-10-CM | POA: Insufficient documentation

## 2018-02-27 DIAGNOSIS — R079 Chest pain, unspecified: Secondary | ICD-10-CM | POA: Diagnosis not present

## 2018-02-27 DIAGNOSIS — R0602 Shortness of breath: Secondary | ICD-10-CM | POA: Diagnosis not present

## 2018-02-27 LAB — CBC
HEMATOCRIT: 42.6 % (ref 36.0–46.0)
HEMOGLOBIN: 13.4 g/dL (ref 12.0–15.0)
MCH: 30.8 pg (ref 26.0–34.0)
MCHC: 31.5 g/dL (ref 30.0–36.0)
MCV: 97.9 fL (ref 78.0–100.0)
Platelets: 254 10*3/uL (ref 150–400)
RBC: 4.35 MIL/uL (ref 3.87–5.11)
RDW: 11.9 % (ref 11.5–15.5)
WBC: 8.5 10*3/uL (ref 4.0–10.5)

## 2018-02-27 LAB — I-STAT BETA HCG BLOOD, ED (MC, WL, AP ONLY): I-stat hCG, quantitative: <5 m[IU]/mL (ref <5)

## 2018-02-27 LAB — BASIC METABOLIC PANEL
ANION GAP: 8 (ref 5–15)
BUN: 12 mg/dL (ref 6–20)
CALCIUM: 9.4 mg/dL (ref 8.9–10.3)
CHLORIDE: 105 mmol/L (ref 98–111)
CO2: 26 mmol/L (ref 22–32)
Creatinine, Ser: 0.99 mg/dL (ref 0.44–1.00)
GFR calc non Af Amer: >60 mL/min (ref >60)
Glucose, Bld: 88 mg/dL (ref 70–99)
POTASSIUM: 3.6 mmol/L (ref 3.5–5.1)
Sodium: 139 mmol/L (ref 135–145)

## 2018-02-27 LAB — D-DIMER, QUANTITATIVE (NOT AT ARMC)

## 2018-02-27 LAB — I-STAT TROPONIN, ED: TROPONIN I, POC: 0 ng/mL (ref 0.00–0.08)

## 2018-02-27 MED ORDER — IPRATROPIUM-ALBUTEROL 0.5-2.5 (3) MG/3ML IN SOLN
3.0000 mL | Freq: Once | RESPIRATORY_TRACT | Status: AC
  Administered 2018-02-27: 3 mL via RESPIRATORY_TRACT
  Filled 2018-02-27: qty 3

## 2018-02-27 MED ORDER — IPRATROPIUM BROMIDE 0.02 % IN SOLN
0.5000 mg | Freq: Once | RESPIRATORY_TRACT | Status: AC
  Administered 2018-02-27: 0.5 mg via RESPIRATORY_TRACT
  Filled 2018-02-27: qty 2.5

## 2018-02-27 NOTE — ED Provider Notes (Signed)
Patient placed in Quick Look pathway, seen and evaluated   Chief Complaint: chest pain  HPI: Debra Combs is a 52 y.o. female with hx of asthma who presents to the ED with chest pain that started one week ago. The pain is located in the center of the chest. Today the patent reports she woke with numbness to left arm and left side of face.   ROS: Neuro: numbness left arm and left side of face  Resp: cough and wheezing  Physical Exam:  BP (!) 149/63 (BP Location: Right Arm)   Pulse 85   Temp 99.6 F (37.6 C) (Oral)   Resp 18   SpO2 100%    Gen: No distress  Neuro: Awake and Alert, grips are equal, no pronator drift  Skin: Warm and dry   Lungs: wheezing  Initiation of care has begun. The patient has been counseled on the process, plan, and necessity for staying for the completion/evaluation, and the remainder of the medical screening examination    Ashley Murrain, NP 02/27/18 Johnnye Lana    Isla Pence, MD 02/27/18 2032

## 2018-02-27 NOTE — Discharge Instructions (Addendum)
Take Tylenol as directed for pain.  Do not find a serious or dangerous cause of your discomfort today.  If you continue to have discomfort by next week, call Dr. Lysle Rubens to schedule an office visit.  Return if concern for any reason

## 2018-02-27 NOTE — ED Triage Notes (Signed)
Pt here with c/o chest pain  Times 1 week in the center of the chest non radiating, pt also c/o some numbness to the left arm and side of face that was there when she got up this morning

## 2018-02-27 NOTE — ED Provider Notes (Signed)
[facility] EMERGENCY DEPARTMENT Provider Note   CSN: [id number] Arrival date & time: 02/27/18  1725     History   Chief Complaint Chief Complaint  Patient presents with  . Chest Pain    HPI Debra Combs is a 52 y.o. female.  Complains of anterior chest pain Is a tightness, constant for approximately the past 1 week.  Symptoms gradual in onset, accompanied by shortness of breath.  No nausea or sweatiness.  She also reports that tightness radiates to left shoulder and to left face she denies cough denies fever.  She is treated herself with her Brio inhaler and with her albuterol inhaler without relief.  No other associated symptoms HPI  Past Medical History:  Diagnosis Date  . Acne   . Anxiety   . Asthma   . Breast cyst    left breast  . Cervical dysplasia 1997  . Dizziness   . GERD (gastroesophageal reflux disease)   . HA (headache)   . Post-concussion headache 07/21/2014    Patient Active Problem List   Diagnosis Date Noted  . Menstrual migraine without status migrainosus, not intractable 11/18/2014  . Postconcussion syndrome 11/18/2014  . Post-concussion headache 07/21/2014  . Dizziness   . HA (headache)   . Status post cryoablation 06/25/2014  . Nonhealing surgical wound 08/28/2012  . 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 ANKLE SURGERY  1983   Left  . BREAST CYST EXCISION  06/08/11   left breast  . DILATION AND CURETTAGE OF UTERUS    . ENDOMETRIAL ABLATION  2008   Her Option  . GYNECOLOGIC CRYOSURGERY  1997     OB History    Gravida  6   Para  2   Term  2   Preterm      AB  4   Living  2     SAB      TAB      Ectopic      Multiple      Live Births  2            Home Medications    Prior to Admission medications   Medication Sig Start Date End Date Taking? Authorizing Provider  ALBUTEROL IN Inhale into the lungs.      [provider]   aspirin-acetaminophen-caffeine (EXCEDRIN MIGRAINE) [phone] MG per tablet Take 1 tablet by mouth every 6 (six) hours as needed for headache.    [provider]  Cholecalciferol (VITAMIN D PO) Take by mouth.    [provider]  Fluticasone Furoate-Vilanterol (BREO ELLIPTA IN) Inhale into the lungs.    [provider]  montelukast (SINGULAIR) 10 MG tablet Take 10 mg by mouth at bedtime.    [provider]  omeprazole (PRILOSEC) 20 MG capsule Take 20 mg by mouth daily.    [provider]  spironolactone (ALDACTONE) 25 MG tablet Take 25 mg by mouth daily.      [provider]  topiramate (TOPAMAX) 25 MG tablet Take 1 tablet (25 mg total) by mouth daily. Patient not taking: Reported on 10/15/2017 07/06/15   Ward Givens, NP    Family History Family History  Problem Relation Age of Onset  . Diabetes Mother   . Hypertension Mother   . Esophageal cancer Mother   . Cancer Mother        esopphageal  . Kidney failure Mother   . Esophageal cancer  Father   . Cancer Father        esophageal  . Hypertension Maternal Grandmother   . Diabetes Maternal Grandmother   . Leukemia Maternal Grandmother   . Kidney failure Maternal Aunt   . Diabetes Maternal Aunt   . Kidney failure Maternal Uncle   . Diabetes Maternal Uncle   . Diabetes Sister   Family history negative for coronary disease  Social History Social History   Tobacco Use  . Smoking status: Never Smoker  . Smokeless tobacco: Never Used  Substance Use Topics  . Alcohol use: No    Alcohol/week: 0.0 standard drinks  . Drug use: No     Allergies   Avelox [moxifloxacin hcl in nacl]; Nitrofurantoin monohyd macro; Sulfa antibiotics; Zithromax [azithromycin dihydrate]; and Shrimp [shellfish allergy]   Review of Systems Review of Systems  Constitutional: Negative.   HENT: Negative.   Respiratory: Positive for chest tightness and shortness of breath.   Cardiovascular: Negative.    Gastrointestinal: Negative.   Musculoskeletal: Negative.   Skin: Negative.   Neurological: Negative.   Psychiatric/Behavioral: Negative.   All other systems reviewed and are negative.    Physical Exam Updated Vital Signs BP 136/71 (BP Location: Right Arm)   Pulse 84   Temp 99 F (37.2 C) (Oral)   Resp 17   SpO2 100%   Physical Exam  Constitutional: She appears well-developed and well-nourished.  HENT:  Head: Normocephalic and atraumatic.  Eyes: Pupils are equal, round, and reactive to light. Conjunctivae are normal.  Neck: Neck supple. No tracheal deviation present. No thyromegaly present.  Cardiovascular: Normal rate, regular rhythm and normal heart sounds. Exam reveals no friction rub.  No murmur heard. Pulmonary/Chest: Effort normal and breath sounds normal.  Abdominal: Soft. Bowel sounds are normal. She exhibits no distension. There is no tenderness.  Obese  Musculoskeletal: Normal range of motion. She exhibits no edema or tenderness.  Neurological: She is alert. Coordination normal.  Skin: Skin is warm and dry. No rash noted.  Psychiatric: She has a normal mood and affect.  Nursing note and vitals reviewed.    ED Treatments / Results  Labs (all labs ordered are listed, but only abnormal results are displayed) Labs Reviewed  BASIC METABOLIC PANEL  CBC  D-DIMER, QUANTITATIVE (NOT AT [facility])  I-STAT TROPONIN, ED  I-STAT BETA HCG BLOOD, ED ([facility], [facility], [facility] ONLY)    EKG EKG Interpretation  Date/Time:  Thursday February 27 2018 17:30:17 EDT Ventricular Rate:  87 PR Interval:  156 QRS Duration: 82 QT Interval:  372 QTC Calculation: 447 R Axis:   16 Text Interpretation:  Normal sinus rhythm Normal ECG No significant change since last tracing Confirmed by Orlie Dakin [id number]) on 02/27/2018 7:46:55 PM Chest x-ray reviewed by me  Radiology Dg Chest 2 View  Result Date: 02/27/2018 CLINICAL DATA:  Chest pain and shortness of breath for 1 week. EXAM: CHEST - 2 VIEW  COMPARISON:  03/16/2016 FINDINGS: Cardiomediastinal silhouette is normal. Mediastinal contours appear intact. There is no evidence of focal airspace consolidation, pleural effusion or pneumothorax. Bilateral lower lobe linear atelectasis versus scarring. Osseous structures are without acute abnormality. Soft tissues are grossly normal. IMPRESSION: No active cardiopulmonary disease. Bilateral lower lobe atelectasis versus scarring. Electronically Signed   By: Fidela Salisbury M.D.   On: 02/27/2018 18:54   Ct Head Wo Contrast  Result Date: 02/27/2018 CLINICAL DATA:  Numbness to the left arm and left side of the face. EXAM: CT HEAD WITHOUT CONTRAST TECHNIQUE: Contiguous axial  images were obtained from the base of the skull through the vertex without intravenous contrast. COMPARISON:  MRI of the brain 06/27/2014 FINDINGS: Brain: No evidence of acute infarction, hemorrhage, hydrocephalus, extra-axial collection or mass lesion/mass effect. Vascular: No hyperdense vessel or unexpected calcification. Skull: Normal. Negative for fracture or focal lesion. Sinuses/Orbits: No acute finding. Other: None. IMPRESSION: No acute intracranial abnormality. Electronically Signed   By: Fidela Salisbury M.D.   On: 02/27/2018 19:17    Procedures Procedures (including critical care time)  Medications Ordered in ED Medications  ipratropium-albuterol (DUONEB) 0.5-2.5 (3) MG/3ML nebulizer solution 3 mL (3 mLs Nebulization Given 02/27/18 1751)  ipratropium (ATROVENT) nebulizer solution 0.5 mg (0.5 mg Nebulization Given 02/27/18 1751)   Results for orders placed or performed during the hospital encounter of 74/25/95  Basic metabolic panel  Result Value Ref Range   Sodium 139 135 - 145 mmol/L   Potassium 3.6 3.5 - 5.1 mmol/L   Chloride 105 98 - 111 mmol/L   CO2 26 22 - 32 mmol/L   Glucose, Bld 88 70 - 99 mg/dL   BUN 12 6 - 20 mg/dL   Creatinine, Ser 0.99 0.44 - 1.00 mg/dL   Calcium 9.4 8.9 - 10.3 mg/dL   GFR calc  non Af Amer >60 >60 mL/min   GFR calc Af Amer >60 >60 mL/min   Anion gap 8 5 - 15  CBC  Result Value Ref Range   WBC 8.5 4.0 - 10.5 K/uL   RBC 4.35 3.87 - 5.11 MIL/uL   Hemoglobin 13.4 12.0 - 15.0 g/dL   HCT 42.6 36.0 - 46.0 %   MCV 97.9 78.0 - 100.0 fL   MCH 30.8 26.0 - 34.0 pg   MCHC 31.5 30.0 - 36.0 g/dL   RDW 11.9 11.5 - 15.5 %   Platelets 254 150 - 400 K/uL  D-dimer, quantitative (not at [facility])  Result Value Ref Range   D-Dimer, Quant <0.27 0.00 - 0.50 ug/mL-FEU  I-stat troponin, ED  Result Value Ref Range   Troponin i, poc 0.00 0.00 - 0.08 ng/mL   Comment 3          I-Stat beta hCG blood, ED  Result Value Ref Range   I-stat hCG, quantitative <5.0 <5 mIU/mL   Comment 3           Dg Chest 2 View  Result Date: 02/27/2018 CLINICAL DATA:  Chest pain and shortness of breath for 1 week. EXAM: CHEST - 2 VIEW COMPARISON:  03/16/2016 FINDINGS: Cardiomediastinal silhouette is normal. Mediastinal contours appear intact. There is no evidence of focal airspace consolidation, pleural effusion or pneumothorax. Bilateral lower lobe linear atelectasis versus scarring. Osseous structures are without acute abnormality. Soft tissues are grossly normal. IMPRESSION: No active cardiopulmonary disease. Bilateral lower lobe atelectasis versus scarring. Electronically Signed   By: Fidela Salisbury M.D.   On: 02/27/2018 18:54   Ct Head Wo Contrast  Result Date: 02/27/2018 CLINICAL DATA:  Numbness to the left arm and left side of the face. EXAM: CT HEAD WITHOUT CONTRAST TECHNIQUE: Contiguous axial images were obtained from the base of the skull through the vertex without intravenous contrast. COMPARISON:  MRI of the brain 06/27/2014 FINDINGS: Brain: No evidence of acute infarction, hemorrhage, hydrocephalus, extra-axial collection or mass lesion/mass effect. Vascular: No hyperdense vessel or unexpected calcification. Skull: Normal. Negative for fracture or focal lesion. Sinuses/Orbits: No acute finding.  Other: None. IMPRESSION: No acute intracranial abnormality. Electronically Signed   By: Linwood Dibbles.D.  On: 02/27/2018 19:17    Initial Impression / Assessment and Plan / ED Course  I have reviewed the triage vital signs and the nursing notes.  Pertinent labs & imaging results that were available during my care of the patient were reviewed by me and considered in my medical decision making (see chart for details).     Heart score equals 1.  8:50 PM patient continues to complain of chest tightness.  She appears comfortable.  She states symptoms are mild.  She declines pain medicine.  Chest pain is felt to be highly atypical.  Low pretest clinical suspicion for pulmonary embolism negative d-dimer.  Plan follow-up with Dr. Deforest Hoyles.  Tylenol for pain  Final Clinical Impressions(s) / ED Diagnoses  Diagnoses atypical chest pain Final diagnoses:  None    ED Discharge Orders    None       Orlie Dakin, MD 02/27/18 2054

## 2018-03-26 DIAGNOSIS — Z23 Encounter for immunization: Secondary | ICD-10-CM | POA: Diagnosis not present

## 2018-04-23 ENCOUNTER — Encounter: Payer: Self-pay | Admitting: Gynecology

## 2018-04-23 DIAGNOSIS — Z1231 Encounter for screening mammogram for malignant neoplasm of breast: Secondary | ICD-10-CM | POA: Diagnosis not present

## 2018-07-10 DIAGNOSIS — G4733 Obstructive sleep apnea (adult) (pediatric): Secondary | ICD-10-CM | POA: Diagnosis not present

## 2018-07-23 ENCOUNTER — Other Ambulatory Visit: Payer: Self-pay | Admitting: Surgery

## 2018-07-23 ENCOUNTER — Other Ambulatory Visit (HOSPITAL_COMMUNITY): Payer: Self-pay | Admitting: Surgery

## 2018-07-25 ENCOUNTER — Encounter: Payer: BLUE CROSS/BLUE SHIELD | Attending: Surgery | Admitting: Dietician

## 2018-07-25 ENCOUNTER — Encounter: Payer: Self-pay | Admitting: Dietician

## 2018-07-25 VITALS — Ht 64.0 in | Wt 204.0 lb

## 2018-07-25 DIAGNOSIS — E669 Obesity, unspecified: Secondary | ICD-10-CM | POA: Diagnosis not present

## 2018-07-25 NOTE — Progress Notes (Signed)
Bariatric Pre-Op Nutrition Assessment Medical Nutrition Therapy  Appt Start Time: 9:10am  End time: 10:10am  Patient was seen on 07/25/2018 for Pre-Operative Nutrition Assessment. Assessment and letter of approval faxed to [facility] Bariatric Surgery Program coordinator on 07/25/2018.    Planned surgery: Sleeve Gastrectomy Pt expectation of surgery: to help with sleep apnea; would like to weigh about 150 lbs Pt expectation of dietitian: none stated   Anthropometrics  Start weight at [facility]: 204 lbs (date: 07/25/2018) Height: 64 in BMI: 35 kg/m2    Clinical  Medical Hx: obesity, GERD, cervical dysplasia, breast cyst, asthma, anxiety Surgeries: ankle, breast cyst excision, gynecologic cryosurgery, wisdom teeth   Medications: montelukast, omeprazole, spironolactone, albuterol Allergies: sulfa antibiotics, nitrofurantion, Avelox, Zithromax (intolerance), shrimp   Psychosocial/Lifestyle Pt works 12-16 hours/day from home for [employer]. Pt lives with her husband and their 2 children. Pt is kind and personable, and is excited for surgery.    24-Hr Dietary Recall First Meal: cereal + milk (or avocado + toast, or donut, or muffin) Snack: almonds Second Meal: [employer] chicken sandwich (or fish sandwich) + fries  Snack: peanut butter crackers (chips)  Third Meal: taco salad (or cube steak, or chicken casserole, or meat + vegetable)  Snack: none (sometimes chocolate cake)  Beverages: sweet tea, water + flavor packets, lemonade, mini Pepsi   Food & Nutrition Related Hx Dietary Hx: Pt does not prefer red meat. Dinner is often eaten late after son's basketball game. Pt states she has a sweet tooth, likes chocolate cake. Pt states she used to eat a lot more fast food, and still typically does for lunch (her husband comes home from work to eat lunch with her, and often picks up fast food.) Pt states she is trying to cook more at home, but is able to "be at work" although she works from  home.  Estimated Daily Fluid Intake: 64 oz Supplements: biotin, vitamin D  GI / Other Notable Symptoms: heartburn   Physical Activity  Current average weekly physical activity: ADLs  Estimated Energy Needs Calories: 1600 Carbohydrate: 180g Protein: 120g Fat: 44g  Pre-Op Goals Reviewed with the Patient . Track food and beverage intake (try MyFitness Pal or the Baritastic app) . Make healthy food choices while monitoring portion sizes . Avoid concentrated sugars and fried foods . Keep fat & sugar in the single digits per serving on food labels . Practice CHEWING your food (aim for applesauce consistency) . Practice not drinking 15 minutes before, during, and 30 minutes after each meal and snack . Avoid all carbonated beverages (ex: soda, sparkling beverages)  . Limit caffeinated beverages (ex: coffee, tea, energy drinks) . Avoid all sugar-sweetened beverages (ex: regular soda, sports drinks)  . Avoid alcohol  . Consume 3 meals per day or try to eat every 3-5 hours . Make a list of non-food related activities . Aim for 64-100 ounces of FLUID daily (with at least half of fluid intake being plain water)  . Aim for at least 60-80 grams of PROTEIN daily . Look for a liquid protein source that contains ?15 g protein and ?5 g carbohydrate (ex: shakes, drinks, shots) . Physical activity is an important part of a healthy lifestyle so keep it moving! The goal is to reach 150 minutes of exercise per week, including cardiovascular and weight baring activity.  *Goals that are bolded indicate the pt would like to start working towards these  Handouts Provided Include  . Bariatric Surgery handouts (Nutrition Visits, Pre-Op Goals, Protein  Shakes, Vitamins & Minerals, Support Group 2020 Schedule)  Learning Style & Readiness for Change Teaching method utilized: Visual & Auditory  Demonstrated degree of understanding via: Teach Back  Barriers to learning/adherence to lifestyle change: None  Identified  RD's Notes for Next Visit . Breakfast Ideas   Next Steps Supervised Weight Loss (SWL) Visits Needed: 6  Patient is to return to [facility] in 1 month for 1st SWL Visit.  Patient is to call [facility] to enroll in Pre-Op Class (>2 weeks before surgery) and Post-Op Class (2 weeks after surgery) for further nutrition education when surgery date is scheduled.

## 2018-07-25 NOTE — Patient Instructions (Signed)
Begin working through the [employer] discussed today, starting with the following:  . Limit caffeinated beverages (ex: coffee, tea, energy drinks)  Start taking vitamin D and a multivitamin if you aren't already.   See you next month!

## 2018-08-06 DIAGNOSIS — G43909 Migraine, unspecified, not intractable, without status migrainosus: Secondary | ICD-10-CM | POA: Diagnosis not present

## 2018-08-06 DIAGNOSIS — J4520 Mild intermittent asthma, uncomplicated: Secondary | ICD-10-CM | POA: Diagnosis not present

## 2018-08-06 DIAGNOSIS — Z1389 Encounter for screening for other disorder: Secondary | ICD-10-CM | POA: Diagnosis not present

## 2018-08-06 DIAGNOSIS — J302 Other seasonal allergic rhinitis: Secondary | ICD-10-CM | POA: Diagnosis not present

## 2018-08-06 DIAGNOSIS — E559 Vitamin D deficiency, unspecified: Secondary | ICD-10-CM | POA: Diagnosis not present

## 2018-08-06 DIAGNOSIS — Z0000 Encounter for general adult medical examination without abnormal findings: Secondary | ICD-10-CM | POA: Diagnosis not present

## 2018-08-07 ENCOUNTER — Ambulatory Visit (HOSPITAL_COMMUNITY)
Admission: RE | Admit: 2018-08-07 | Discharge: 2018-08-07 | Disposition: A | Discharge status: Home / Self Care | Payer: BLUE CROSS/BLUE SHIELD | Source: Ambulatory Visit | Attending: Surgery | Admitting: Surgery

## 2018-08-07 ENCOUNTER — Other Ambulatory Visit (HOSPITAL_COMMUNITY): Payer: Self-pay | Admitting: Surgery

## 2018-08-07 ENCOUNTER — Other Ambulatory Visit: Payer: Self-pay

## 2018-08-07 DIAGNOSIS — Z01818 Encounter for other preprocedural examination: Secondary | ICD-10-CM | POA: Diagnosis not present

## 2018-08-21 ENCOUNTER — Encounter: Payer: BLUE CROSS/BLUE SHIELD | Attending: Surgery | Admitting: Skilled Nursing Facility1

## 2018-08-21 ENCOUNTER — Other Ambulatory Visit: Payer: Self-pay

## 2018-08-21 DIAGNOSIS — E669 Obesity, unspecified: Secondary | ICD-10-CM | POA: Insufficient documentation

## 2018-08-21 NOTE — Patient Instructions (Addendum)
-  Use diabetes foodhub for recipe ideas  -Work on identifying why you are snacking   -Do not skip a meal

## 2018-08-21 NOTE — Progress Notes (Signed)
Sleeve Assessment:  1st SWL Appointment.    Planned surgery: Sleeve Gastrectomy Pt expectation of surgery: to help with sleep apnea; would like to weigh about 150 lbs Pt expectation of dietitian: none stated   Supervised Weight Loss (SWL) Visits Needed: 6  Pt arrive shaving lost about 1 pounds. Pt state she has snacked before and now she is snacking possibly due to boredom and nerves. Pt states she has been forced to cook more and experimenting with different foods. Pt states she is open to a plant based diet thinking she could sneak it into her family's dishes. Pt state she gets really bad migraines so she uses pepsi to stop them. Pt states she has been playing basketball with her son multiple times a day.   Anthropometrics  Start weight at [facility]: 204 lbs (date: 07/25/2018) Height: 64 in BMI: 34.84 kg/m2    Clinical  Medical Hx: obesity, GERD, cervical dysplasia, breast cyst, asthma, anxiety Surgeries: ankle, breast cyst excision, gynecologic cryosurgery, wisdom teeth   Medications: montelukast, omeprazole, spironolactone, albuterol Allergies: sulfa antibiotics, nitrofurantion, Avelox, Zithromax (intolerance), shrimp   Psychosocial/Lifestyle Pt works 12-16 hours/day from home for [employer]. Pt lives with her husband and their 2 children. Pt is kind and personable, and is excited for surgery.    24-Hr Dietary Recall First Meal: smoothie or bacon eggs grits  Snack: almonds Second Meal: sandiwch Snack: peanut butter crackers (chips)  Third Meal: salmon cakes with eggs and grits or hamburgers or chicken pies  Snack: none (sometimes chocolate cake)  Beverages: sweet tea, water + flavor packets, lemonade, mini Pepsi   Food & Nutrition Related Hx Dietary Hx: Pt does not prefer red meat. Dinner is often eaten late after son's basketball game. Pt states she has a sweet tooth, likes chocolate cake. Pt states she used to eat a lot more fast food, and still typically does for lunch (her husband  comes home from work to eat lunch with her, and often picks up fast food.) Pt states she is trying to cook more at home, but is able to "be at work" although she works from home.  Estimated Daily Fluid Intake: 64 oz Supplements: biotin, vitamin D  GI / Other Notable Symptoms: heartburn   Physical Activity  Current average weekly physical activity: ADLs  Estimated Energy Needs Calories: 1600 Carbohydrate: 180g Protein: 120g Fat: 44g  Nutritional Diagnosis:  [place]-3.3 Overweight/obesity related to past poor dietary habits and physical inactivity as evidenced by patient w/ planned sleeve gastrectomy surgery following dietary guidelines for continued weight loss.    Intervention:  Nutrition counseling for upcoming Bariatric Surgery. Goals: -Encouraged to engage in 150 minutes of moderate physical activity including cardiovascular and weight baring weekly -Use diabetes foodhub for recipe ideas  -Work on identifying why you are snacking   -Do not skip a meal   Teaching Method Utilized:  Visual Auditory Hands on   Barriers to learning/adherence to lifestyle change: none identified   Demonstrated degree of understanding via:  Teach Back   Monitoring/Evaluation:  Dietary intake, exercise,and body weight prn.

## 2018-08-25 ENCOUNTER — Ambulatory Visit: Payer: BLUE CROSS/BLUE SHIELD | Admitting: Dietician

## 2018-09-16 DIAGNOSIS — G4733 Obstructive sleep apnea (adult) (pediatric): Secondary | ICD-10-CM | POA: Diagnosis not present

## 2018-09-23 ENCOUNTER — Ambulatory Visit: Payer: Self-pay | Admitting: Skilled Nursing Facility1

## 2018-09-24 ENCOUNTER — Other Ambulatory Visit: Payer: Self-pay

## 2018-09-24 ENCOUNTER — Encounter: Payer: BLUE CROSS/BLUE SHIELD | Attending: Surgery | Admitting: Skilled Nursing Facility1

## 2018-09-24 DIAGNOSIS — E669 Obesity, unspecified: Secondary | ICD-10-CM

## 2018-09-24 NOTE — Patient Instructions (Addendum)
-  Google [employer]  -Eat non starchy vegetables 2 times a day 7 days a week  -Put together meals using the meal ideas sheet  -Take your 30 minute break to have a lunch: either walking with your lunch or taking your lunch on the walk  -Challenge your food thoughts

## 2018-09-24 NOTE — Progress Notes (Signed)
Sleeve Assessment: 2nd SWL Appointment.    Planned surgery: Sleeve Gastrectomy Pt expectation of surgery: to help with sleep apnea; would like to weigh about 150 lbs  Supervised Weight Loss (SWL) Visits Needed: 6  Pt arrives having lost about 2.5 pounds. Pt state she has snacked before and now she is snacking possibly due to boredom and nerves. Pt states she has been forced to cook more and experimenting with different foods. Pt states she is open to a plant based diet thinking she could sneak it into her family's dishes but her family is not open to this. Pt state she gets really bad migraines so she uses pepsi to stop them. Pt states she has been playing basketball with her son multiple times a day.  Pt does not prefer red meat. Dinner is often eaten late after son's basketball game. Pt states she has a sweet tooth, likes chocolate cake. Pt works 12-16 hours/day from home for [employer] at home.   Pt states she has been working out more. Pt states she lost her dog last week having a seizure and dying right in front of her. 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    Anthropometrics  Start weight at [facility]: 204 lbs (date: 07/25/2018) Weight: 202.8 Height: 64 in BMI: 34.81 kg/m2    Clinical  Medical Hx: obesity, GERD, cervical dysplasia, breast cyst, asthma, anxiety Surgeries: ankle, breast cyst excision, gynecologic cryosurgery, wisdom teeth   Medications: montelukast, omeprazole, spironolactone, albuterol Allergies: sulfa antibiotics, nitrofurantion, Avelox, Zithromax (intolerance), shrimp   24-Hr Dietary Recall First Meal 7:30-8: carnation breakfast essential  Second meal 10: bacon and eggs or cereal  Snack: peanut butter crackers or nuts or chips Third Meal: salmon cakes with eggs and grits or hamburgers or chicken pies Or flounder with grits Snack: none (sometimes chocolate cake)   Beverages: sweet tea, water + flavor packets, lemonade, mini Pepsi   Food & Nutrition Related Hx   Estimated Daily Fluid Intake: 64 oz Supplements: biotin, vitamin D  GI / Other Notable Symptoms: heartburn   Physical Activity  Current average weekly physical activity: walking 2 miles sometimes another walk and playing basketball   Estimated Energy Needs Calories: 1600 Carbohydrate: 180g Protein: 120g Fat: 44g  Nutritional Diagnosis:  [place]-3.3 Overweight/obesity related to past poor dietary habits and physical inactivity as evidenced by patient w/ planned sleeve gastrectomy surgery following dietary guidelines for continued weight loss.    Intervention:  Nutrition counseling for upcoming Bariatric Surgery. Goals: -Encouraged to engage in 150 minutes of moderate physical activity including cardiovascular and weight baring weekly -Google [employer] -Eat non starchy vegetables 2 times a day 7 days a week -Put together meals using the meal ideas sheet -Take your 30 minute break to have a lunch: either walking with your lunch or taking your lunch on the walk -Challenge your food thoughts   Teaching Method Utilized:  Visual Auditory Hands on   Barriers to learning/adherence to lifestyle change: none identified/family   Demonstrated degree of understanding via:  Teach Back   Monitoring/Evaluation:  Dietary intake, exercise,and body weight prn.

## 2018-10-17 IMAGING — CT CT HEAD W/O CM
4 series · 17 of 47 positions shown, 19 images · non-contrast
Comparison: MRI of the brain 06/27/2014

CLINICAL DATA: Numbness to the left arm and left side of the face.

EXAM:
CT HEAD WITHOUT CONTRAST
TECHNIQUE: Contiguous axial images were obtained from the base of the skull
through the vertex without intravenous contrast.

[Series 3: head wo · axial · 0.43mm/px · z∈[-89,+31]mm · 7 of 33 slices shown, 9 images]
[im 5/33  brain]
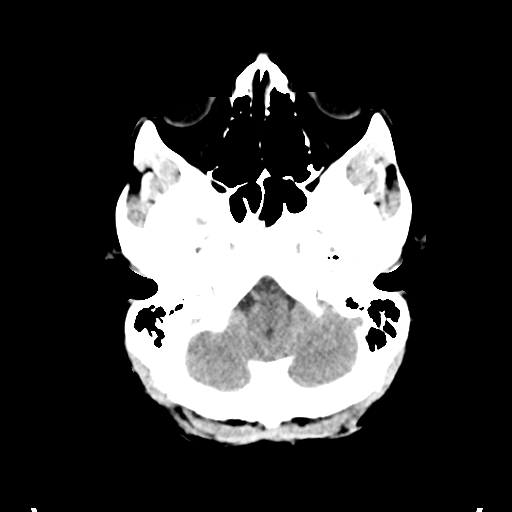
[im 5/33  bone]
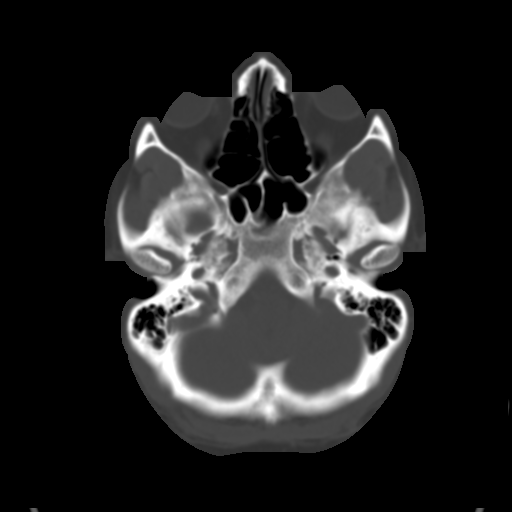
[im 9/33  brain]
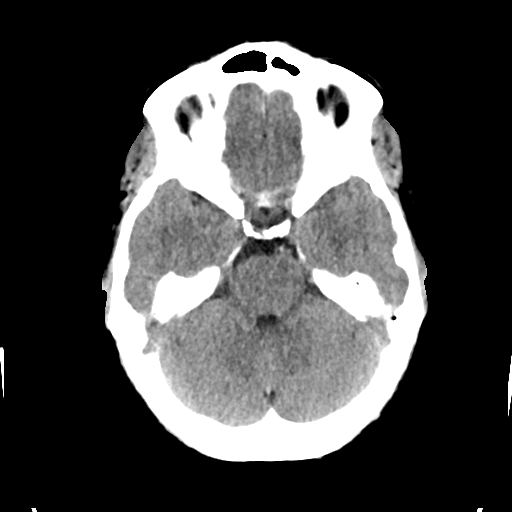
[im 13/33  brain]
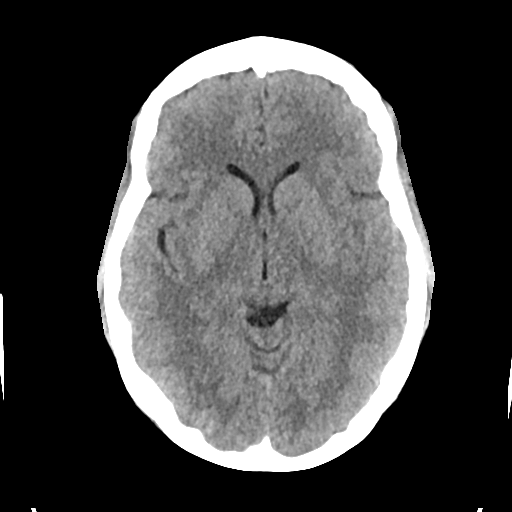
[im 17/33  brain]
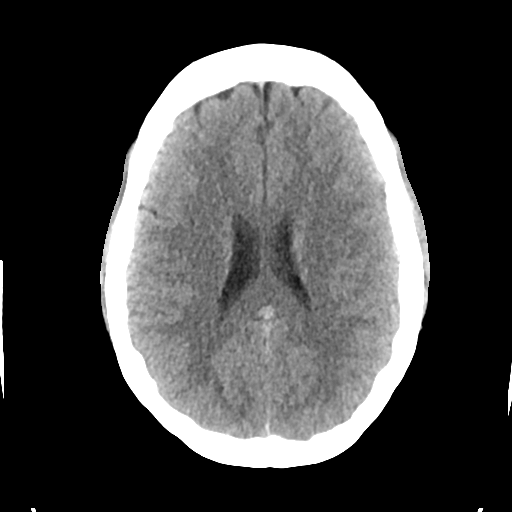
[im 21/33  brain]
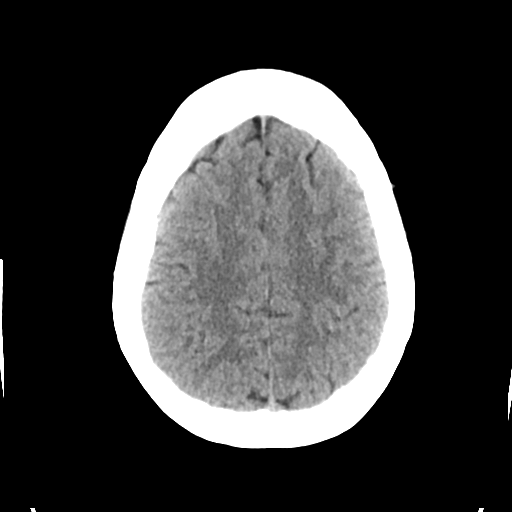
[im 21/33  bone]
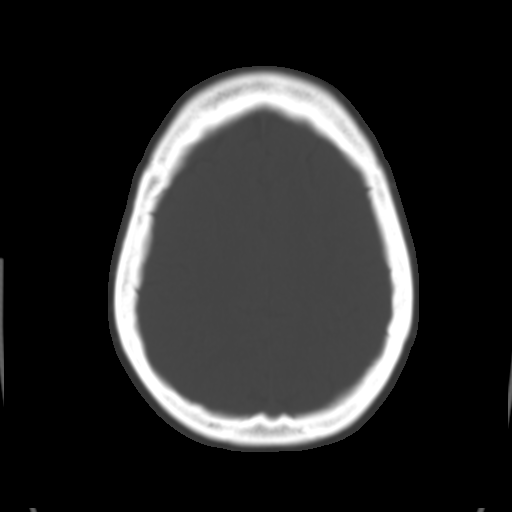
[im 25/33  brain]
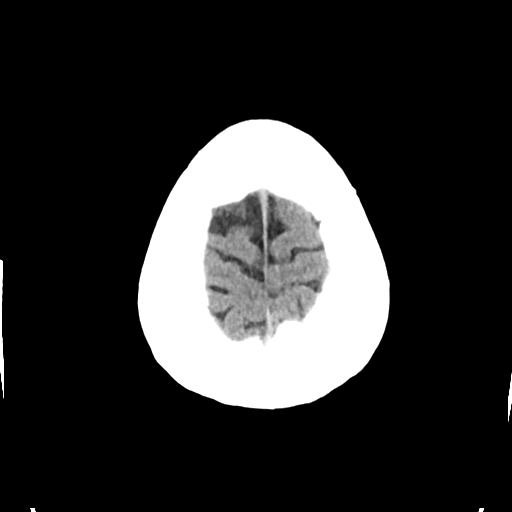
[im 29/33  brain]
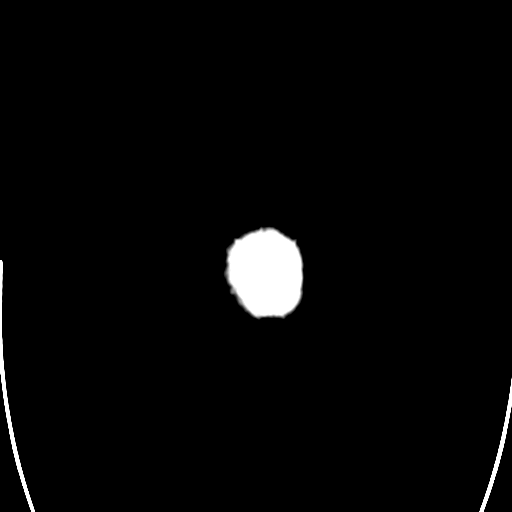

[Series 4: head bone · axial · 0.43mm/px · z∈[-93,-37]mm · 4 of 81 slices shown]
[im 9/81  bone]
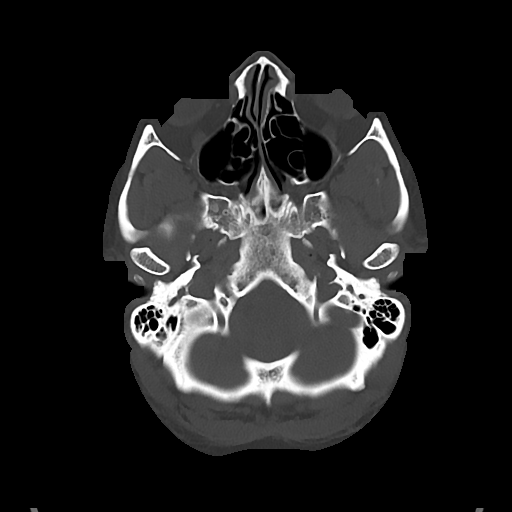
[im 17/81  bone]
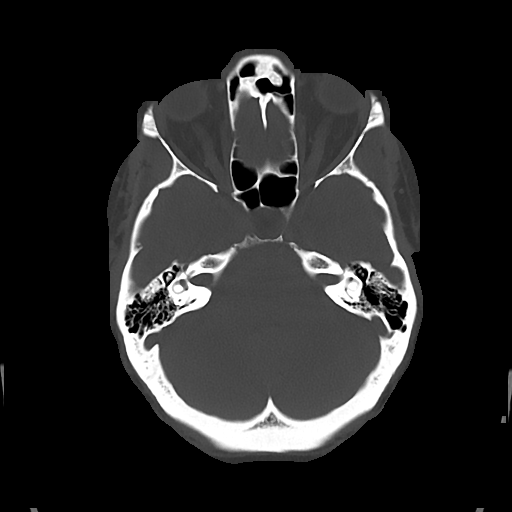
[im 25/81  bone]
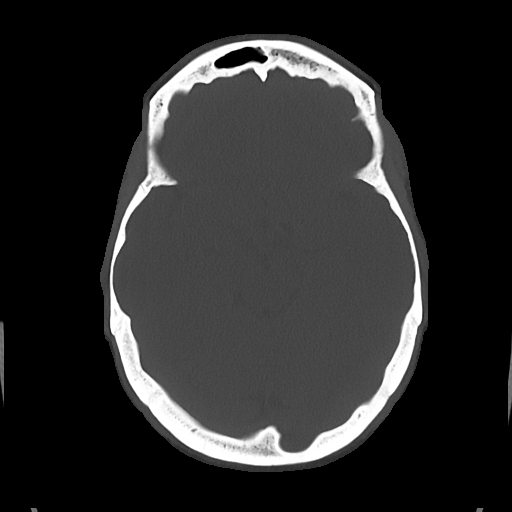
[im 37/81  bone]
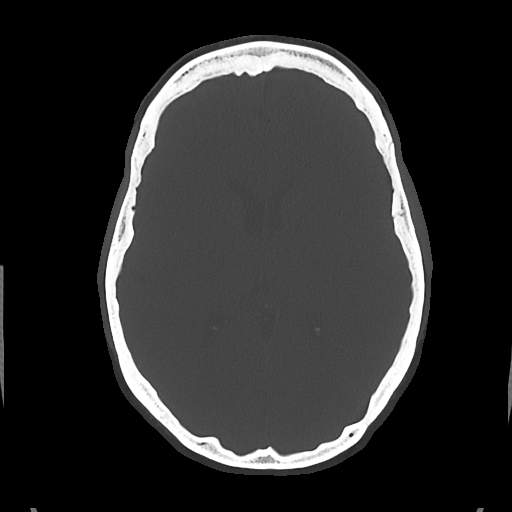

[Series 5: cor soft · coronal · 0.33mm/px · 3 of 73 slices shown]
[im 25/73  brain]
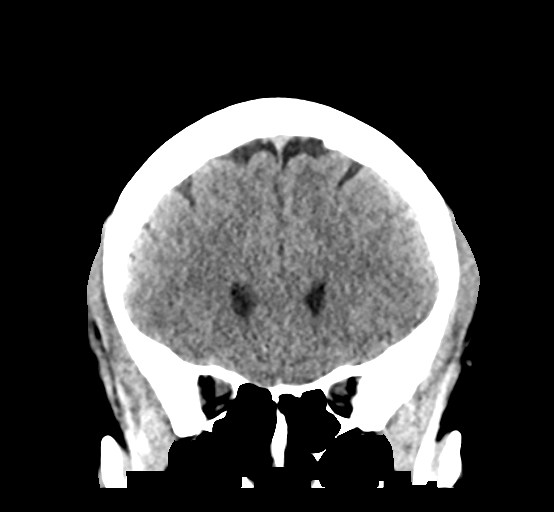
[im 33/73  brain]
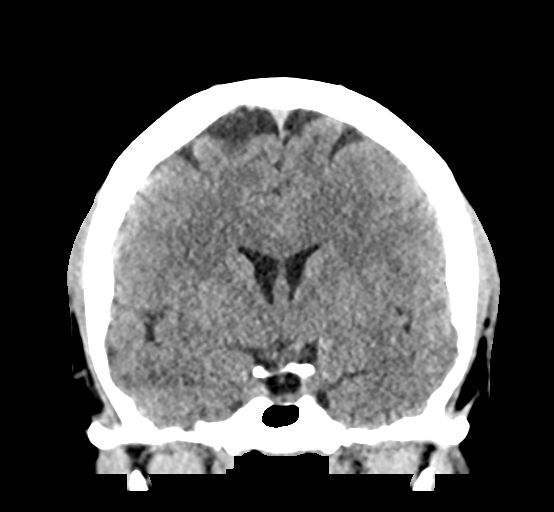
[im 41/73  brain]
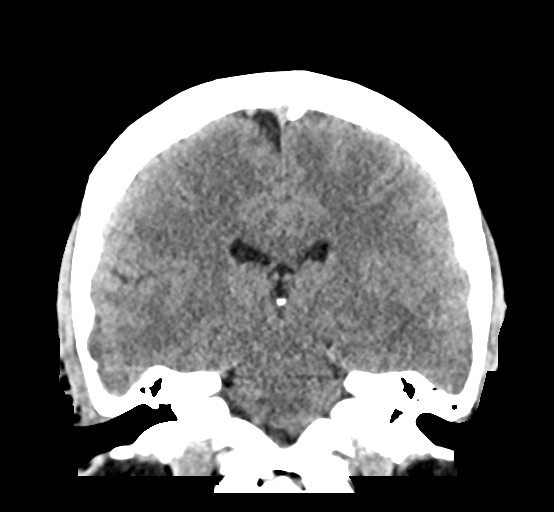

[Series 6: sag soft · sagittal · 0.34mm/px · 3 of 67 slices shown]
[im 23/67  brain]
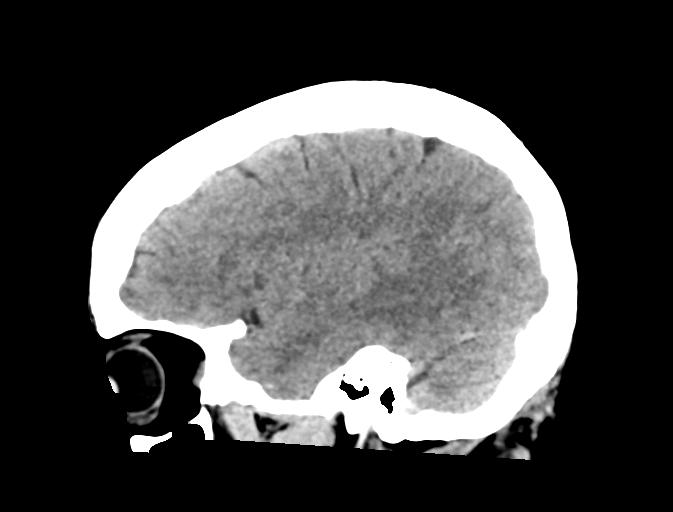
[im 34/67  brain]
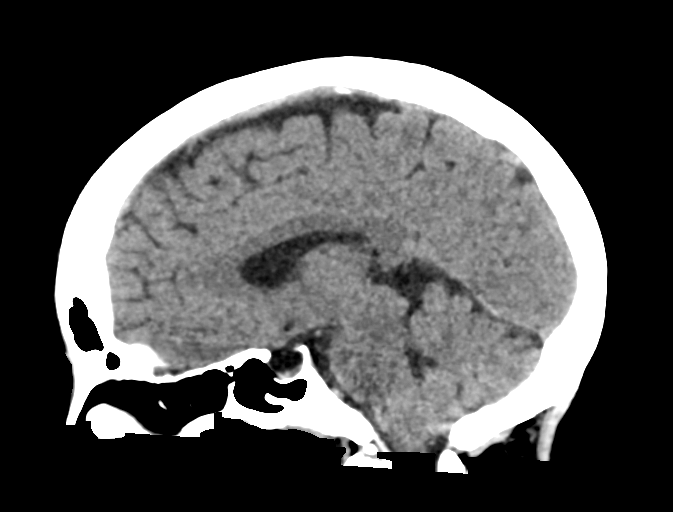
[im 45/67  brain]
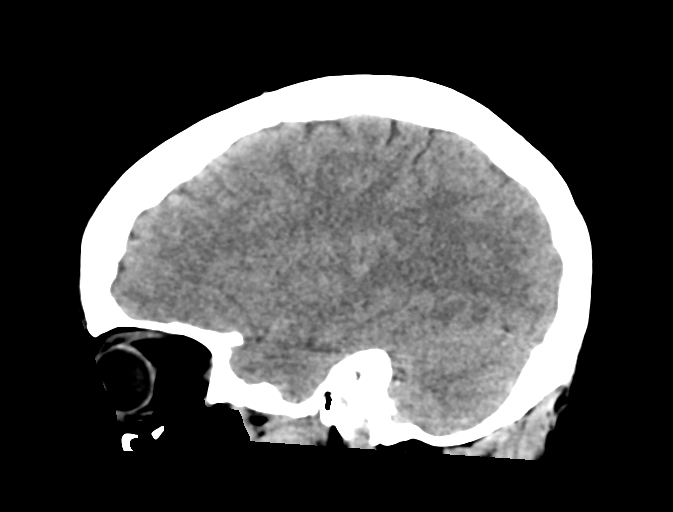

[17 of 47 positions shown; findings below may reference images not displayed]

FINDINGS: Brain: No evidence of acute infarction, hemorrhage, hydrocephalus,
extra-axial collection or mass lesion/mass effect.

Vascular: No hyperdense vessel or unexpected calcification.

Skull: Normal. Negative for fracture or focal lesion.

Sinuses/Orbits: No acute finding.

Other: None.
IMPRESSION: No acute intracranial abnormality.

## 2018-10-17 IMAGING — CR DG CHEST 2V
2 series · 2 of 2 positions shown · non-contrast
Comparison: 03/16/2016

CLINICAL DATA: Chest pain and shortness of breath for 1 week.

EXAM:
CHEST - 2 VIEW

[chest pa]
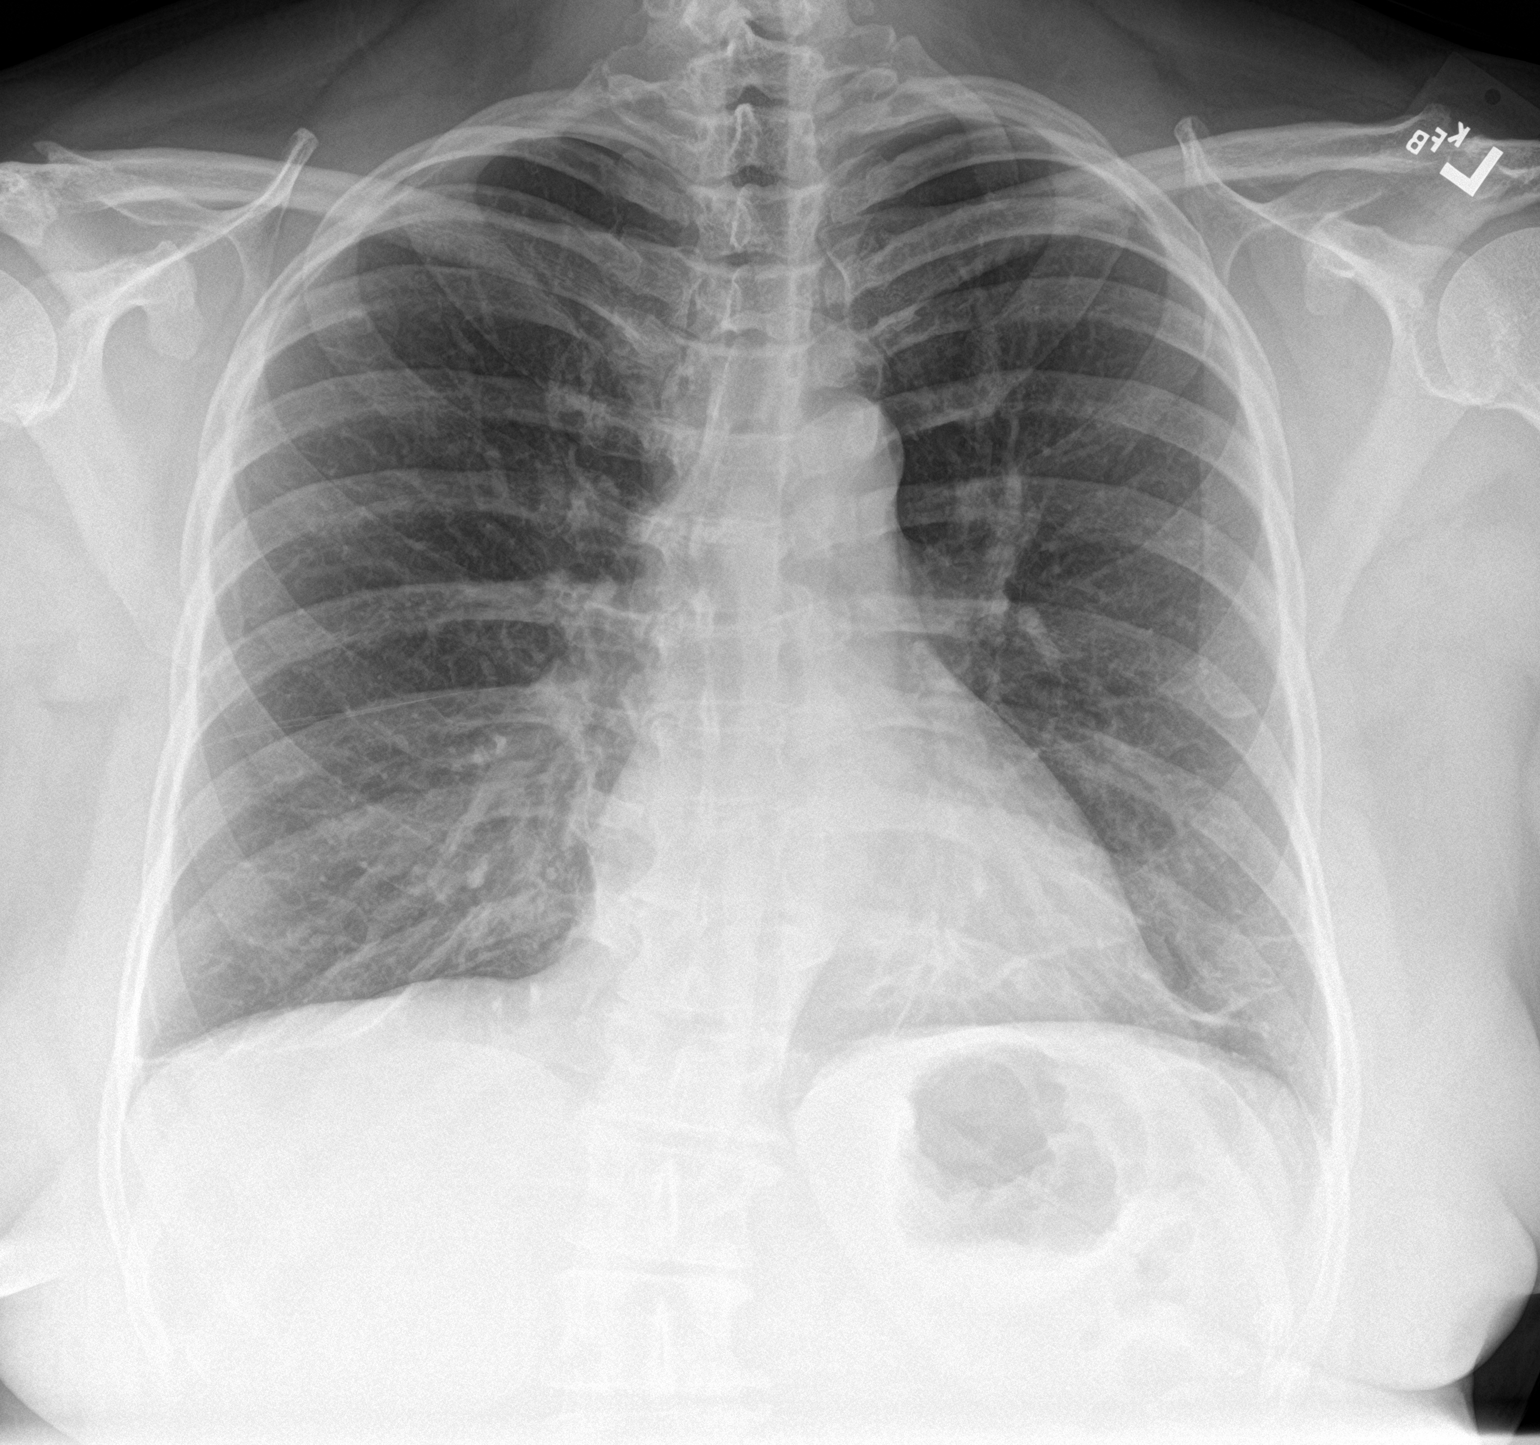

[chest lat]
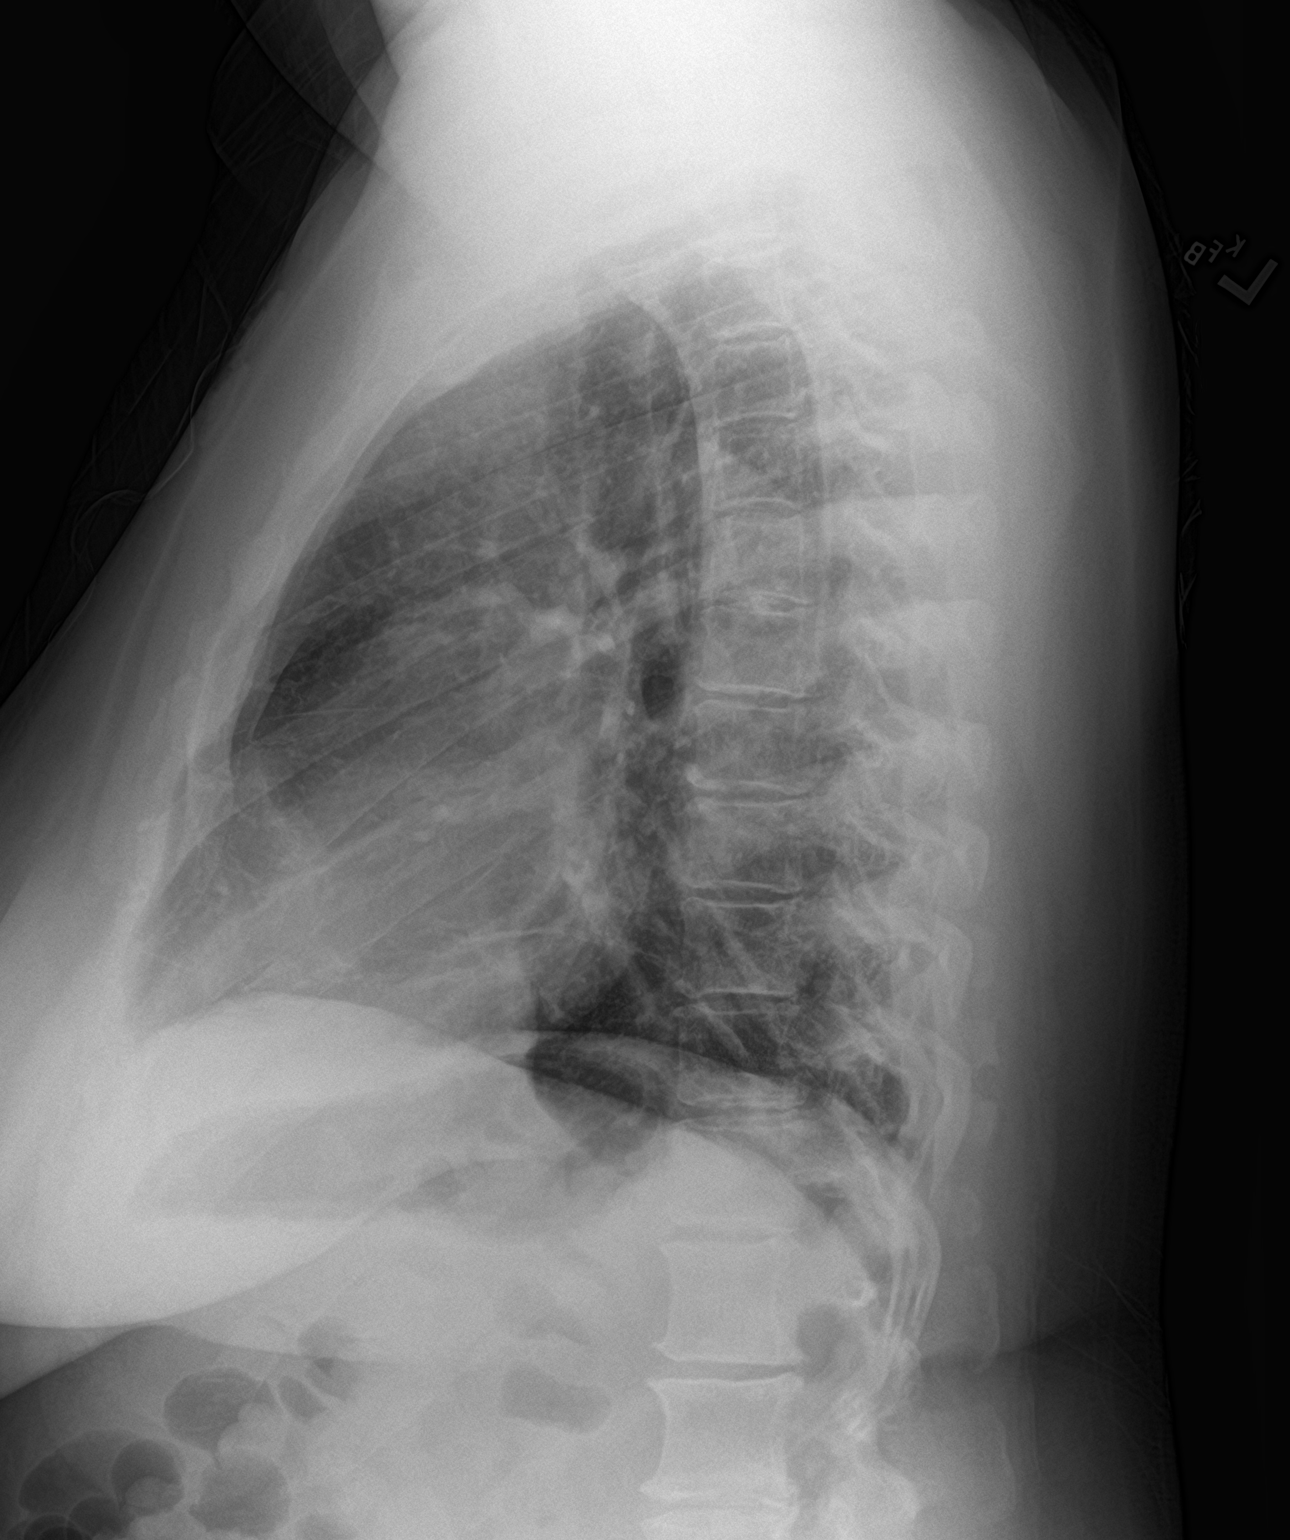

[2 of 2 positions shown; findings below may reference images not displayed]

FINDINGS: Cardiomediastinal silhouette is normal. Mediastinal contours appear
intact.

There is no evidence of focal airspace consolidation, pleural
effusion or pneumothorax. Bilateral lower lobe linear atelectasis
versus scarring.

Osseous structures are without acute abnormality. Soft tissues are
grossly normal.
IMPRESSION: No active cardiopulmonary disease.

Bilateral lower lobe atelectasis versus scarring.

## 2018-10-22 ENCOUNTER — Ambulatory Visit (INDEPENDENT_AMBULATORY_CARE_PROVIDER_SITE_OTHER): Payer: BLUE CROSS/BLUE SHIELD | Admitting: Women's Health

## 2018-10-22 ENCOUNTER — Encounter: Payer: Self-pay | Admitting: Women's Health

## 2018-10-22 ENCOUNTER — Encounter: Payer: BLUE CROSS/BLUE SHIELD | Admitting: Women's Health

## 2018-10-22 ENCOUNTER — Other Ambulatory Visit: Payer: Self-pay

## 2018-10-22 VITALS — BP 124/80 | Ht 63.0 in | Wt 204.0 lb

## 2018-10-22 DIAGNOSIS — Z01419 Encounter for gynecological examination (general) (routine) without abnormal findings: Secondary | ICD-10-CM

## 2018-10-22 NOTE — Patient Instructions (Signed)
Health Maintenance for Postmenopausal Women Menopause is a normal process in which your reproductive ability comes to an end. This process happens gradually over a span of months to years, usually between the ages of 62 and 89. Menopause is complete when you have missed 12 consecutive menstrual periods. It is important to talk with your health care provider about some of the most common conditions that affect postmenopausal women, such as heart disease, cancer, and bone loss (osteoporosis). Adopting a healthy lifestyle and getting preventive care can help to promote your health and wellness. Those actions can also lower your chances of developing some of these common conditions. What should I know about menopause? During menopause, you may experience a number of symptoms, such as:  Moderate-to-severe hot flashes.  Night sweats.  Decrease in sex drive.  Mood swings.  Headaches.  Tiredness.  Irritability.  Memory problems.  Insomnia. Choosing to treat or not to treat menopausal changes is an individual decision that you make with your health care provider. What should I know about hormone replacement therapy and supplements? Hormone therapy products are effective for treating symptoms that are associated with menopause, such as hot flashes and night sweats. Hormone replacement carries certain risks, especially as you become older. If you are thinking about using estrogen or estrogen with progestin treatments, discuss the benefits and risks with your health care provider. What should I know about heart disease and stroke? Heart disease, heart attack, and stroke become more likely as you age. This may be due, in part, to the hormonal changes that your body experiences during menopause. These can affect how your body processes dietary fats, triglycerides, and cholesterol. Heart attack and stroke are both medical emergencies. There are many things that you can do to help prevent heart disease  and stroke:  Have your blood pressure checked at least every 1-2 years. High blood pressure causes heart disease and increases the risk of stroke.  If you are 79-72 years old, ask your health care provider if you should take aspirin to prevent a heart attack or a stroke.  Do not use any tobacco products, including cigarettes, chewing tobacco, or electronic cigarettes. If you need help quitting, ask your health care provider.  It is important to eat a healthy diet and maintain a healthy weight. ? Be sure to include plenty of vegetables, fruits, low-fat dairy products, and lean protein. ? Avoid eating foods that are high in solid fats, added sugars, or salt (sodium).  Get regular exercise. This is one of the most important things that you can do for your health. ? Try to exercise for at least 150 minutes each week. The type of exercise that you do should increase your heart rate and make you sweat. This is known as moderate-intensity exercise. ? Try to do strengthening exercises at least twice each week. Do these in addition to the moderate-intensity exercise.  Know your numbers.Ask your health care provider to check your cholesterol and your blood glucose. Continue to have your blood tested as directed by your health care provider.  What should I know about cancer screening? There are several types of cancer. Take the following steps to reduce your risk and to catch any cancer development as early as possible. Breast Cancer  Practice breast self-awareness. ? This means understanding how your breasts normally appear and feel. ? It also means doing regular breast self-exams. Let your health care provider know about any changes, no matter how small.  If you are 40 or  older, have a clinician do a breast exam (clinical breast exam or CBE) every year. Depending on your age, family history, and medical history, it may be recommended that you also have a yearly breast X-ray (mammogram).  If you  have a family history of breast cancer, talk with your health care provider about genetic screening.  If you are at high risk for breast cancer, talk with your health care provider about having an MRI and a mammogram every year.  Breast cancer (BRCA) gene test is recommended for women who have family members with BRCA-related cancers. Results of the assessment will determine the need for genetic counseling and BRCA1 and for BRCA2 testing. BRCA-related cancers include these types: ? Breast. This occurs in males or females. ? Ovarian. ? Tubal. This may also be called fallopian tube cancer. ? Cancer of the abdominal or pelvic lining (peritoneal cancer). ? Prostate. ? Pancreatic. Cervical, Uterine, and Ovarian Cancer Your health care provider may recommend that you be screened regularly for cancer of the pelvic organs. These include your ovaries, uterus, and vagina. This screening involves a pelvic exam, which includes checking for microscopic changes to the surface of your cervix (Pap test).  For women ages 21-65, health care providers may recommend a pelvic exam and a Pap test every three years. For women ages 39-65, they may recommend the Pap test and pelvic exam, combined with testing for human papilloma virus (HPV), every five years. Some types of HPV increase your risk of cervical cancer. Testing for HPV may also be done on women of any age who have unclear Pap test results.  Other health care providers may not recommend any screening for nonpregnant women who are considered low risk for pelvic cancer and have no symptoms. Ask your health care provider if a screening pelvic exam is right for you.  If you have had past treatment for cervical cancer or a condition that could lead to cancer, you need Pap tests and screening for cancer for at least 20 years after your treatment. If Pap tests have been discontinued for you, your risk factors (such as having a new sexual partner) need to be reassessed  to determine if you should start having screenings again. Some women have medical problems that increase the chance of getting cervical cancer. In these cases, your health care provider may recommend that you have screening and Pap tests more often.  If you have a family history of uterine cancer or ovarian cancer, talk with your health care provider about genetic screening.  If you have vaginal bleeding after reaching menopause, tell your health care provider.  There are currently no reliable tests available to screen for ovarian cancer. Lung Cancer Lung cancer screening is recommended for adults 57-50 years old who are at high risk for lung cancer because of a history of smoking. A yearly low-dose CT scan of the lungs is recommended if you:  Currently smoke.  Have a history of at least 30 pack-years of smoking and you currently smoke or have quit within the past 15 years. A pack-year is smoking an average of one pack of cigarettes per day for one year. Yearly screening should:  Continue until it has been 15 years since you quit.  Stop if you develop a health problem that would prevent you from having lung cancer treatment. Colorectal Cancer  This type of cancer can be detected and can often be prevented.  Routine colorectal cancer screening usually begins at age 12 and continues through  age 63.  If you have risk factors for colon cancer, your health care provider may recommend that you be screened at an earlier age.  If you have a family history of colorectal cancer, talk with your health care provider about genetic screening.  Your health care provider may also recommend using home test kits to check for hidden blood in your stool.  A small camera at the end of a tube can be used to examine your colon directly (sigmoidoscopy or colonoscopy). This is done to check for the earliest forms of colorectal cancer.  Direct examination of the colon should be repeated every 5-10 years until  age 75. However, if early forms of precancerous polyps or small growths are found or if you have a family history or genetic risk for colorectal cancer, you may need to be screened more often. Skin Cancer  Check your skin from head to toe regularly.  Monitor any moles. Be sure to tell your health care provider: ? About any new moles or changes in moles, especially if there is a change in a mole's shape or color. ? If you have a mole that is larger than the size of a pencil eraser.  If any of your family members has a history of skin cancer, especially at a [place] age, talk with your health care provider about genetic screening.  Always use sunscreen. Apply sunscreen liberally and repeatedly throughout the day.  Whenever you are outside, protect yourself by wearing long sleeves, pants, a wide-brimmed hat, and sunglasses. What should I know about osteoporosis? Osteoporosis is a condition in which bone destruction happens more quickly than new bone creation. After menopause, you may be at an increased risk for osteoporosis. To help prevent osteoporosis or the bone fractures that can happen because of osteoporosis, the following is recommended:  If you are 59-59 years old, get at least 1,000 mg of calcium and at least 600 mg of vitamin D per day.  If you are older than age 36 but younger than age 32, get at least 1,200 mg of calcium and at least 600 mg of vitamin D per day.  If you are older than age 47, get at least 1,200 mg of calcium and at least 800 mg of vitamin D per day. Smoking and excessive alcohol intake increase the risk of osteoporosis. Eat foods that are rich in calcium and vitamin D, and do weight-bearing exercises several times each week as directed by your health care provider. What should I know about how menopause affects my mental health? Depression may occur at any age, but it is more common as you become older. Common symptoms of depression include:  Low or sad mood.   Changes in sleep patterns.  Changes in appetite or eating patterns.  Feeling an overall lack of motivation or enjoyment of activities that you previously enjoyed.  Frequent crying spells. Talk with your health care provider if you think that you are experiencing depression. What should I know about immunizations? It is important that you get and maintain your immunizations. These include:  Tetanus, diphtheria, and pertussis (Tdap) booster vaccine.  Influenza every year before the flu season begins.  Pneumonia vaccine.  Shingles vaccine. Your health care provider may also recommend other immunizations. This information is not intended to replace advice given to you by your health care provider. Make sure you discuss any questions you have with your health care provider. Document Released: 07/06/2005 Document Revised: 12/02/2015 Document Reviewed: 02/15/2015 Elsevier Interactive Patient Education  2019 [place].

## 2018-10-22 NOTE — Progress Notes (Signed)
AMEILA Combs 1965-12-18 [id number]    History:    Presents for annual exam.  Cycles every 1 to 3 months, occasional hot flushes some vaginal dryness.  Husband vasectomy.  2007 endometrial ablation with good relief of menorrhagia.  1997 cryo with normal Paps after.  Normal mammogram history.  2019 benign colon polyps 5-year follow-up at [facility].  History of GERD, negative esophageal biopsies.  Both parents deceased from esophageal cancer.  Past medical history, past surgical history, family history and social history were all reviewed and documented in the EPIC chart.  Works from home.  Daughter 30 attending [employer] son  42 junior in high school, both have received Gardasil and are doing well.  Mother diabetes.  ROS:  A ROS was performed and pertinent positives and negatives are included.  Exam:  Vitals:   10/22/18 1126  BP: 124/80  Weight: 204 lb (92.5 kg)  Height: 5\' 3"  (1.6 m)   Body mass index is 36.14 kg/m.   General appearance:  Normal Thyroid:  Symmetrical, normal in size, without palpable masses or nodularity. Respiratory  Auscultation:  Clear without wheezing or rhonchi Cardiovascular  Auscultation:  Regular rate, without rubs, murmurs or gallops  Edema/varicosities:  Not grossly evident Abdominal  Soft,nontender, without masses, guarding or rebound.  Liver/spleen:  No organomegaly noted  Hernia:  None appreciated  Skin  Inspection:  Grossly normal   Breasts: Examined lying and sitting.     Right: Without masses, retractions, discharge or axillary adenopathy.     Left: Without masses, retractions, discharge or axillary adenopathy. Gentitourinary   Inguinal/mons:  Normal without inguinal adenopathy  External genitalia:  Normal  BUS/Urethra/Skene's glands:  Normal  Vagina:  Normal  Cervix:  Normal  Uterus:   normal in size, shape and contour.  Midline and mobile  Adnexa/parametria:     Rt: Without masses or tenderness.   Lt: Without masses or tenderness.  Anus and  perineum: Normal  Digital rectal exam: Normal sphincter tone without palpated masses or tenderness  Assessment/Plan:  53 y.o. MBF G6P2 for annual exam with no complaints.  Perimenopausal/irregular cycles/vasectomy/2007 endometrial ablation Asthma-primary care manages labs and meds Obesity  Plan: Reviewed importance of increasing exercise and decreasing calorie/carbs for weight loss and diabetes prevention.  Menopause reviewed, tolerating perimenopausal symptoms without problem.  SBEs, continue annual 3D screening mammogram, calcium rich foods, vitamin D 2000 daily encouraged.  2019 Pap ASCUS with negative high risk HPV, new screening guidelines reviewed.    [place], 11:31 AM 10/22/2018

## 2018-10-23 ENCOUNTER — Ambulatory Visit: Payer: BLUE CROSS/BLUE SHIELD | Admitting: Skilled Nursing Facility1

## 2018-10-29 ENCOUNTER — Other Ambulatory Visit: Payer: Self-pay

## 2018-10-29 ENCOUNTER — Encounter: Payer: BLUE CROSS/BLUE SHIELD | Attending: Surgery | Admitting: Skilled Nursing Facility1

## 2018-10-29 ENCOUNTER — Encounter: Payer: BLUE CROSS/BLUE SHIELD | Admitting: Women's Health

## 2018-10-29 DIAGNOSIS — E669 Obesity, unspecified: Secondary | ICD-10-CM | POA: Diagnosis not present

## 2018-10-29 NOTE — Progress Notes (Signed)
Sleeve Assessment: 3rd SWL Appointment.    Planned surgery: Sleeve Gastrectomy Pt expectation of surgery: to help with sleep apnea; would like to weigh about 150 lbs  Supervised Weight Loss (SWL) Visits Needed: 6  Pt does not prefer red meat. Dinner is often eaten late after son's basketball game. Pt states she has a sweet tooth, likes chocolate cake. Pt works 12-16 hours/day from home for [employer] at home.   Pt arrives having gained about 2 pounds. Pt and her husband reviewed [employer] together and found it helpful. Pt states her son is struggling with vegetables but he is trying. Pt states her family is definitely on board and trying to make healthy changes. Pt states breakfast has been difficult. Pt states she has been trying plant based options along with her niece who has lost 100 pounds. Pt states she is still having headaches every day.  Dietitian discussed breakfast ideas with pt.   Anthropometrics  Start weight at [facility]: 204 lbs (date: 07/25/2018) Weight: 204.7 Height: 64 in BMI: 35.14 kg/m2    Clinical  Medical Hx: obesity, GERD, cervical dysplasia, breast cyst, asthma, anxiety Surgeries: ankle, breast cyst excision, gynecologic cryosurgery, wisdom teeth   Medications: montelukast, omeprazole, spironolactone, albuterol Allergies: sulfa antibiotics, nitrofurantion, Avelox, Zithromax (intolerance), shrimp   24-Hr Dietary Recall First Meal 7:30-8: carnation breakfast essential or smoothie with fruit and carnation instant breakfast Second meal 10: bacon and eggs or cereal or chicken quesadilla  Snack: peanut butter crackers or nuts or chips Third Meal: salmon cakes with eggs and grits or hamburgers or chicken pies Or flounder with grits Snack: none (sometimes chocolate cake)  Beverages: sweet tea, water + flavor packets, lemonade, mini Pepsi   Food & Nutrition Related Hx   Estimated Daily Fluid Intake: 64 oz Supplements: biotin, vitamin D  GI / Other Notable Symptoms:  heartburn   Physical Activity  Current average weekly physical activity: walking 2 miles sometimes another walk and playing basketball   Estimated Energy Needs Calories: 1600 Carbohydrate: 180g Protein: 120g Fat: 44g  Nutritional Diagnosis:  [place]-3.3 Overweight/obesity related to past poor dietary habits and physical inactivity as evidenced by patient w/ planned sleeve gastrectomy surgery following dietary guidelines for continued weight loss.    Intervention:  Nutrition counseling for upcoming Bariatric Surgery. Goals: -Encouraged to engage in 150 minutes of moderate physical activity including cardiovascular and weight baring weekly -Continue to Eat non starchy vegetables 2 times a day 7 days a week -Put together meals using the meal ideas sheet -Take your 30 minute break to have a lunch: either walking with your lunch or taking your lunch on the walk -Challenge your food thoughts  -Add protein to your smoothie  -Continue to work on not drinking with meals  Teaching Method Utilized:  Visual Auditory Hands on   Barriers to learning/adherence to lifestyle change: none identified/family   Demonstrated degree of understanding via:  Teach Back   Monitoring/Evaluation:  Dietary intake, exercise,and body weight prn.

## 2018-11-04 DIAGNOSIS — M545 Low back pain: Secondary | ICD-10-CM | POA: Diagnosis not present

## 2018-11-08 DIAGNOSIS — M545 Low back pain: Secondary | ICD-10-CM | POA: Diagnosis not present

## 2018-11-11 DIAGNOSIS — M545 Low back pain: Secondary | ICD-10-CM | POA: Diagnosis not present

## 2018-11-12 DIAGNOSIS — M545 Low back pain: Secondary | ICD-10-CM | POA: Diagnosis not present

## 2018-11-12 DIAGNOSIS — M5416 Radiculopathy, lumbar region: Secondary | ICD-10-CM | POA: Diagnosis not present

## 2018-12-01 DIAGNOSIS — M545 Low back pain: Secondary | ICD-10-CM | POA: Diagnosis not present

## 2018-12-01 DIAGNOSIS — M5416 Radiculopathy, lumbar region: Secondary | ICD-10-CM | POA: Diagnosis not present

## 2018-12-02 ENCOUNTER — Other Ambulatory Visit: Payer: Self-pay

## 2018-12-02 ENCOUNTER — Encounter: Payer: BC Managed Care – PPO | Attending: Surgery | Admitting: Skilled Nursing Facility1

## 2018-12-02 DIAGNOSIS — E669 Obesity, unspecified: Secondary | ICD-10-CM | POA: Diagnosis not present

## 2018-12-02 NOTE — Progress Notes (Signed)
Sleeve Assessment: 4th SWL Appointment.    Planned surgery: Sleeve Gastrectomy Pt expectation of surgery: to help with sleep apnea; would like to weigh about 150 lbs  Supervised Weight Loss (SWL) Visits Needed: 6  Pt does not prefer red meat. Dinner is often eaten late after son's basketball game. Pt states she has a sweet tooth, likes chocolate cake. Pt works 12-16 hours/day from home for [employer] at home.    Pt arrives having lost about 1 pound. Pt states she does not eat as much when it has been this hot so she has not had much of an appetite. Pt states work has been very busy so it is still a struggle to take her breaks. Pt states she has been having back issues which seems to be getting better but it gets in the way of being as active as she likes. Pt states she put in a request to get a standing desk.  Pt was given the link to support group.    Anthropometrics  Start weight at [facility]: 204 lbs (date: 07/25/2018) Weight: 203.9 Height: 64 in BMI: 35.00 kg/m2    Clinical  Medical Hx: obesity, GERD, cervical dysplasia, breast cyst, asthma, anxiety Surgeries: ankle, breast cyst excision, gynecologic cryosurgery, wisdom teeth   Medications: montelukast, omeprazole, spironolactone, albuterol Allergies: sulfa antibiotics, nitrofurantion, Avelox, Zithromax (intolerance), shrimp   24-Hr Dietary Recall First Meal 7:30-8: carnation breakfast essential or smoothie with fruit and carnation instant breakfast or breakfast burritos  Second meal 10: bacon and eggs or cereal or chicken quesadilla or salad Snack: peanut butter crackers or nuts or chips Third Meal: salmon cakes with eggs and grits or hamburgers or chicken pies Or flounder with grits or salad Snack: none (sometimes chocolate cake)  Beverages: sweet tea, water + flavor packets, lemonade, mini Pepsi   Food & Nutrition Related Hx   Estimated Daily Fluid Intake: 64 oz Supplements: biotin, vitamin D  GI / Other Notable Symptoms:  heartburn   Physical Activity  Current average weekly physical activity: walking 2 miles sometimes another walk and playing basketball   Estimated Energy Needs Calories: 1600 Carbohydrate: 180g Protein: 120g Fat: 44g  Nutritional Diagnosis:  [place]-3.3 Overweight/obesity related to past poor dietary habits and physical inactivity as evidenced by patient w/ planned sleeve gastrectomy surgery following dietary guidelines for continued weight loss.    Intervention:  Nutrition counseling for upcoming Bariatric Surgery. Goals: -Encouraged to engage in 150 minutes of moderate physical activity including cardiovascular and weight baring weekly -Continue to Eat non starchy vegetables 2 times a day 7 days a week -Put together meals using the meal ideas sheet -Take your 30 minute break to have a lunch: either walking with your lunch or taking your lunch on the walk -Challenge your food thoughts  -Continue to work on not drinking with meals -Attend the support group  Teaching Method Utilized:  Visual Auditory Hands on   Barriers to learning/adherence to lifestyle change: none identified/family   Demonstrated degree of understanding via:  Teach Back   Monitoring/Evaluation:  Dietary intake, exercise,and body weight prn.

## 2018-12-05 DIAGNOSIS — M545 Low back pain: Secondary | ICD-10-CM | POA: Diagnosis not present

## 2018-12-05 DIAGNOSIS — M5416 Radiculopathy, lumbar region: Secondary | ICD-10-CM | POA: Diagnosis not present

## 2018-12-12 ENCOUNTER — Other Ambulatory Visit: Payer: Self-pay

## 2018-12-12 DIAGNOSIS — M5416 Radiculopathy, lumbar region: Secondary | ICD-10-CM | POA: Diagnosis not present

## 2018-12-12 DIAGNOSIS — Z20822 Contact with and (suspected) exposure to covid-19: Secondary | ICD-10-CM

## 2018-12-12 DIAGNOSIS — M545 Low back pain: Secondary | ICD-10-CM | POA: Diagnosis not present

## 2018-12-15 ENCOUNTER — Other Ambulatory Visit: Payer: Self-pay | Admitting: Internal Medicine

## 2018-12-15 DIAGNOSIS — R519 Headache, unspecified: Secondary | ICD-10-CM

## 2018-12-15 DIAGNOSIS — R51 Headache: Secondary | ICD-10-CM | POA: Diagnosis not present

## 2018-12-17 LAB — NOVEL CORONAVIRUS, NAA: SARS-CoV-2, NAA: NOT DETECTED

## 2018-12-19 DIAGNOSIS — M545 Low back pain: Secondary | ICD-10-CM | POA: Diagnosis not present

## 2018-12-19 DIAGNOSIS — M5416 Radiculopathy, lumbar region: Secondary | ICD-10-CM | POA: Diagnosis not present

## 2018-12-24 ENCOUNTER — Other Ambulatory Visit: Payer: BC Managed Care – PPO

## 2018-12-24 DIAGNOSIS — M5416 Radiculopathy, lumbar region: Secondary | ICD-10-CM | POA: Diagnosis not present

## 2018-12-24 DIAGNOSIS — M545 Low back pain: Secondary | ICD-10-CM | POA: Diagnosis not present

## 2018-12-30 ENCOUNTER — Other Ambulatory Visit: Payer: BC Managed Care – PPO

## 2018-12-30 DIAGNOSIS — M545 Low back pain: Secondary | ICD-10-CM | POA: Diagnosis not present

## 2018-12-30 DIAGNOSIS — M5416 Radiculopathy, lumbar region: Secondary | ICD-10-CM | POA: Diagnosis not present

## 2018-12-31 ENCOUNTER — Other Ambulatory Visit: Payer: Self-pay

## 2018-12-31 ENCOUNTER — Encounter: Payer: BC Managed Care – PPO | Attending: Surgery | Admitting: Skilled Nursing Facility1

## 2018-12-31 DIAGNOSIS — E669 Obesity, unspecified: Secondary | ICD-10-CM | POA: Diagnosis not present

## 2018-12-31 NOTE — Progress Notes (Signed)
Sleeve Assessment: 5th SWL Appointment.    Planned surgery: Sleeve Gastrectomy Pt expectation of surgery: to help with sleep apnea; would like to weigh about 150 lbs  Supervised Weight Loss (SWL) Visits Needed: 6  Pt does not prefer red meat. Dinner is often eaten late after son's basketball game. Pt states she has a sweet tooth, likes chocolate cake. Pt works 12-16 hours/day from home for [employer] at home.    Pt states she started having really bad migraines (mirgraines are typical but not this bad) so has started a new medication for that. Pt states she has been taking breaks with work and doing physical therapy breaks while working which has been helping stiff back and hips. Pt state she has been trying more plant based foods. Pt states her family is starting to push back with vegetables but she still eats them.    Anthropometrics  Start weight at [facility]: 204 lbs (date: 07/25/2018) Weight: 204.4 Height: 64 in BMI: 35.07 kg/m2    Clinical  Medical Hx: obesity, GERD, cervical dysplasia, breast cyst, asthma, anxiety Surgeries: ankle, breast cyst excision, gynecologic cryosurgery, wisdom teeth   Medications: montelukast, omeprazole, spironolactone, albuterol Allergies: sulfa antibiotics, nitrofurantion, Avelox, Zithromax (intolerance), shrimp   24-Hr Dietary Recall First Meal 7:30-8: eggs cups with bacon and sausage or carnation breakfast  Second meal 10: bacon and eggs or cereal or chicken quesadilla or salad Snack: peanut butter crackers or nuts or chips Third Meal: salmon cakes with eggs and grits or hamburgers or chicken pies Or flounder with grits or salad Snack: none (sometimes chocolate cake)  Beverages: sweet tea, water + flavor packets, lemonade, mini Pepsi   Food & Nutrition Related Hx   Estimated Daily Fluid Intake: 64 oz Supplements: biotin, vitamin D  GI / Other Notable Symptoms: heartburn   Physical Activity  Current average weekly physical activity: walking 2  miles sometimes another walk and playing basketball   Estimated Energy Needs Calories: 1600 Carbohydrate: 180g Protein: 120g Fat: 44g  Nutritional Diagnosis:  [place]-3.3 Overweight/obesity related to past poor dietary habits and physical inactivity as evidenced by patient w/ planned sleeve gastrectomy surgery following dietary guidelines for continued weight loss.    Intervention:  Nutrition counseling for upcoming Bariatric Surgery. Goals: -Encouraged to engage in 150 minutes of moderate physical activity including cardiovascular and weight baring weekly -Continue to Eat non starchy vegetables 2 times a day 7 days a week -Put together meals using the meal ideas sheet -Take your 30 minute break to have a lunch: either walking with your lunch or taking your lunch on the walk -Challenge your food thoughts  -Continue to work on not drinking with meals  Teaching Method Utilized:  Visual Auditory Hands on   Barriers to learning/adherence to lifestyle change: none identified/family   Demonstrated degree of understanding via:  Teach Back   Monitoring/Evaluation:  Dietary intake, exercise,and body weight prn.

## 2019-01-02 DIAGNOSIS — M5416 Radiculopathy, lumbar region: Secondary | ICD-10-CM | POA: Diagnosis not present

## 2019-01-02 DIAGNOSIS — M545 Low back pain: Secondary | ICD-10-CM | POA: Diagnosis not present

## 2019-01-06 ENCOUNTER — Ambulatory Visit: Payer: BLUE CROSS/BLUE SHIELD | Admitting: Psychiatry

## 2019-01-06 ENCOUNTER — Other Ambulatory Visit: Payer: Self-pay

## 2019-01-06 ENCOUNTER — Ambulatory Visit
Admission: RE | Admit: 2019-01-06 | Discharge: 2019-01-06 | Disposition: A | Discharge status: Home / Self Care | Payer: BC Managed Care – PPO | Source: Ambulatory Visit | Attending: Internal Medicine | Admitting: Internal Medicine

## 2019-01-06 DIAGNOSIS — R51 Headache: Secondary | ICD-10-CM | POA: Diagnosis not present

## 2019-01-06 DIAGNOSIS — R519 Headache, unspecified: Secondary | ICD-10-CM

## 2019-01-08 DIAGNOSIS — M5416 Radiculopathy, lumbar region: Secondary | ICD-10-CM | POA: Diagnosis not present

## 2019-01-08 DIAGNOSIS — M545 Low back pain: Secondary | ICD-10-CM | POA: Diagnosis not present

## 2019-01-13 DIAGNOSIS — L700 Acne vulgaris: Secondary | ICD-10-CM | POA: Diagnosis not present

## 2019-01-13 DIAGNOSIS — D2262 Melanocytic nevi of left upper limb, including shoulder: Secondary | ICD-10-CM | POA: Diagnosis not present

## 2019-01-14 ENCOUNTER — Ambulatory Visit (INDEPENDENT_AMBULATORY_CARE_PROVIDER_SITE_OTHER): Payer: BC Managed Care – PPO | Admitting: Psychology

## 2019-01-14 DIAGNOSIS — F509 Eating disorder, unspecified: Secondary | ICD-10-CM

## 2019-01-15 ENCOUNTER — Telehealth: Payer: Self-pay | Admitting: Dietician

## 2019-01-15 NOTE — Telephone Encounter (Signed)
Patient called [place] office with questions about joining the virtual Bariatric Support Group.   RD returned patient call and provided meeting information verbally and via email.

## 2019-01-19 DIAGNOSIS — G4733 Obstructive sleep apnea (adult) (pediatric): Secondary | ICD-10-CM | POA: Diagnosis not present

## 2019-01-21 DIAGNOSIS — M545 Low back pain: Secondary | ICD-10-CM | POA: Diagnosis not present

## 2019-01-21 DIAGNOSIS — M5416 Radiculopathy, lumbar region: Secondary | ICD-10-CM | POA: Diagnosis not present

## 2019-01-30 ENCOUNTER — Ambulatory Visit (INDEPENDENT_AMBULATORY_CARE_PROVIDER_SITE_OTHER): Payer: BC Managed Care – PPO | Admitting: Psychology

## 2019-01-30 DIAGNOSIS — F509 Eating disorder, unspecified: Secondary | ICD-10-CM | POA: Diagnosis not present

## 2019-02-04 ENCOUNTER — Encounter: Payer: BC Managed Care – PPO | Attending: Surgery | Admitting: Skilled Nursing Facility1

## 2019-02-04 ENCOUNTER — Other Ambulatory Visit: Payer: Self-pay

## 2019-02-04 DIAGNOSIS — E669 Obesity, unspecified: Secondary | ICD-10-CM | POA: Diagnosis not present

## 2019-02-04 NOTE — Progress Notes (Signed)
Sleeve Assessment: 6th SWL Appointment.    Planned surgery: Sleeve Gastrectomy Pt expectation of surgery: to help with sleep apnea; would like to weigh about 150 lbs  Supervised Weight Loss (SWL) Visits Needed: 6  Pt does not prefer red meat. Dinner is often eaten late after son's basketball game. Pt states she has a sweet tooth, likes chocolate cake. Pt works 12-16 hours/day from home for [employer] at home.     Pt states she tried some different foods and meals and found out she does not like bone broth. Pt states her husband is on board with how she will need to be eating.   Pt states over the last 6 months she has been successful with: learning how to eat, limiting caffeine, overall portion control.   Pt states she feels she may struggle with: avoiding pepsi.  Pt has done really well and feels prepared for surgery.   Anthropometrics  Start weight at [facility]: 204 lbs (date: 07/25/2018) Weight: 200.8 Height: 64 in BMI: 34.47 kg/m2  (pt was [employer] would not cover this BMI: pt voiced unserdatning)  Clinical  Medical Hx: obesity, GERD, cervical dysplasia, breast cyst, asthma, anxiety Surgeries: ankle, breast cyst excision, gynecologic cryosurgery, wisdom teeth   Medications: montelukast, omeprazole, spironolactone, albuterol Allergies: sulfa antibiotics, nitrofurantion, Avelox, Zithromax (intolerance), shrimp   24-Hr Dietary Recall First Meal 7:30-8: carnation breakfast or fruit smoothie  Second meal 10: bacon and eggs or cereal or chicken quesadilla or salad Snack: peanut butter crackers or nuts or chips Third Meal: salmon cakes with eggs and grits or hamburgers or chicken pies Or flounder with grits or salad Snack: none (sometimes chocolate cake)  Beverages: sweet tea, water + flavor packets, lemonade, mini Pepsi   Food & Nutrition Related Hx   Estimated Daily Fluid Intake: 64 oz Supplements: biotin, vitamin D  GI / Other Notable Symptoms: heartburn   Physical  Activity  Current average weekly physical activity: walking 2 miles sometimes another walk and playing basketball   Estimated Energy Needs Calories: 1600 Carbohydrate: 180g Protein: 120g Fat: 44g  Nutritional Diagnosis:  [place]-3.3 Overweight/obesity related to past poor dietary habits and physical inactivity as evidenced by patient w/ planned sleeve gastrectomy surgery following dietary guidelines for continued weight loss.    Intervention:  Nutrition counseling for upcoming Bariatric Surgery. Goals: -Encouraged to engage in 150 minutes of moderate physical activity including cardiovascular and weight baring weekly -Continue to Eat non starchy vegetables 2 times a day 7 days a week -Put together meals using the meal ideas sheet -Take your 30 minute break to have a lunch: either walking with your lunch or taking your lunch on the walk -Challenge your food thoughts  -Continue to work on not drinking with meals  Teaching Method Utilized:  Visual Auditory Hands on   Barriers to learning/adherence to lifestyle change: none identified/family   Demonstrated degree of understanding via:  Teach Back   Monitoring/Evaluation:  Dietary intake, exercise,and body weight prn.

## 2019-02-18 ENCOUNTER — Encounter: Payer: Self-pay | Admitting: Gynecology

## 2019-02-28 DIAGNOSIS — Z23 Encounter for immunization: Secondary | ICD-10-CM | POA: Diagnosis not present

## 2019-03-18 DIAGNOSIS — G253 Myoclonus: Secondary | ICD-10-CM | POA: Diagnosis not present

## 2019-04-13 ENCOUNTER — Other Ambulatory Visit: Payer: Self-pay

## 2019-04-13 ENCOUNTER — Encounter: Payer: BC Managed Care – PPO | Attending: Surgery | Admitting: Skilled Nursing Facility1

## 2019-04-13 DIAGNOSIS — E669 Obesity, unspecified: Secondary | ICD-10-CM | POA: Insufficient documentation

## 2019-04-13 NOTE — Progress Notes (Signed)
Pre-Operative Nutrition Class:  Appt start time: 0109   End time:  1830.  Patient was seen on 04/13/2019 for Pre-Operative Bariatric Surgery Education [facility].   Surgery date:  Surgery type: sleeve Start weight at [facility]: 204 Weight today: 205    The following the learning objectives were met by the patient during this course:  Identify Pre-Op Dietary Goals and will begin 2 weeks pre-operatively  Identify appropriate sources of fluids and proteins   State protein recommendations and appropriate sources pre and post-operatively  Identify Post-Operative Dietary Goals and will follow for 2 weeks post-operatively  Identify appropriate multivitamin and calcium sources  Describe the need for physical activity post-operatively and will follow MD recommendations  State when to call healthcare provider regarding medication questions or post-operative complications  Handouts given during class include:  Pre-Op Bariatric Surgery Diet Handout  Protein Shake Handout  Post-Op Bariatric Surgery Nutrition Handout  [facility]ELT Program Information Flyer  Support Group Information Flyer  [facility] Bariatric Supplements Price List  Follow-Up Plan: Patient will follow-up at [facility] 2 weeks post operatively for diet advancement per MD.

## 2019-04-29 ENCOUNTER — Encounter: Payer: Self-pay | Admitting: Gynecology

## 2019-04-29 DIAGNOSIS — Z1231 Encounter for screening mammogram for malignant neoplasm of breast: Secondary | ICD-10-CM | POA: Diagnosis not present

## 2019-05-05 NOTE — Patient Instructions (Addendum)
DUE TO COVID-19 ONLY ONE VISITOR IS ALLOWED TO COME WITH YOU AND STAY IN THE WAITING ROOM ONLY DURING PRE OP AND PROCEDURE DAY OF SURGERY. THE 1 VISITOR MAY VISIT WITH YOU AFTER SURGERY IN YOUR PRIVATE ROOM DURING VISITING HOURS ONLY!  YOU NEED TO HAVE A COVID 19 TEST ON_Friday 12/18/2020_____ @___115  pm____, THIS TEST MUST BE DONE BEFORE SURGERY, COME  [address] [place] , [id number].  ([place]) ONCE YOUR COVID TEST IS COMPLETED, PLEASE BEGIN THE QUARANTINE INSTRUCTIONS AS OUTLINED IN YOUR HANDOUT.                Debra Combs     Your procedure is scheduled on: Tuesday 05/19/2019   Report to [facility] Main  Entrance    Report to Short Stay at 0530  AM               Please bring CPAP mask and tubing with you to the hospital!    Call this number if you have problems the morning of surgery [phone]    Remember: Do not eat food or drink liquids :After Midnight.     BRUSH YOUR TEETH MORNING OF SURGERY AND RINSE YOUR MOUTH OUT, NO CHEWING GUM CANDY OR MINTS.     Take these medicines the morning of surgery with A SIP OF WATER: Omeprazole (Prilosec) if needed,use Breo Ellipta inhaler and use Albuterol inhaler if needed and bring inhalers with you to the hospital                                 You may not have any metal on your body including hair pins and              piercings  Do not wear jewelry, make-up, lotions, powders or perfumes, deodorant             Do not wear nail polish on your fingernails.  Do not shave  48 hours prior to surgery.               Do not bring valuables to the hospital. [place].  Contacts, dentures or bridgework may not be worn into surgery.  Leave suitcase in the car. After surgery it may be brought to your room.                  Please read over the following fact sheets you were  given: _____________________________________________________________________             [facility] - Preparing for Surgery Before surgery, you can play an important role.  Because skin is not sterile, your skin needs to be as free of germs as possible.  You can reduce the number of germs on your skin by washing with CHG (chlorahexidine gluconate) soap before surgery.  CHG is an antiseptic cleaner which kills germs and bonds with the skin to continue killing germs even after washing. Please DO NOT use if you have an allergy to CHG or antibacterial soaps.  If your skin becomes reddened/irritated stop using the CHG and inform your nurse when you arrive at Short Stay. Do not shave (including legs and underarms) for at least 48 hours prior to the first CHG shower.  You may shave your face/neck. Please follow these instructions carefully:  1.  Shower with CHG Soap the night before surgery and the  morning of Surgery.  2.  If you choose to wash your hair, wash your hair first as usual with your  normal  shampoo.  3.  After you shampoo, rinse your hair and body thoroughly to remove the  shampoo.                           4.  Use CHG as you would any other liquid soap.  You can apply chg directly  to the skin and wash                       Gently with a scrungie or clean washcloth.  5.  Apply the CHG Soap to your body ONLY FROM THE NECK DOWN.   Do not use on face/ open                           Wound or open sores. Avoid contact with eyes, ears mouth and genitals (private parts).                       Wash face,  Genitals (private parts) with your normal soap.             6.  Wash thoroughly, paying special attention to the area where your surgery  will be performed.  7.  Thoroughly rinse your body with warm water from the neck down.  8.  DO NOT shower/wash with your normal soap after using and rinsing off  the CHG Soap.                9.  Pat yourself dry with a clean towel.            10.  Wear  clean pajamas.            11.  Place clean sheets on your bed the night of your first shower and do not  sleep with pets. Day of Surgery : Do not apply any lotions/deodorants the morning of surgery.  Please wear clean clothes to the hospital/surgery center.  FAILURE TO FOLLOW THESE INSTRUCTIONS MAY RESULT IN THE CANCELLATION OF YOUR SURGERY PATIENT SIGNATURE_________________________________  NURSE SIGNATURE__________________________________  ________________________________________________________________________

## 2019-05-08 ENCOUNTER — Other Ambulatory Visit: Payer: Self-pay | Admitting: Surgery

## 2019-05-08 ENCOUNTER — Other Ambulatory Visit (HOSPITAL_COMMUNITY): Payer: BC Managed Care – PPO

## 2019-05-08 ENCOUNTER — Encounter (HOSPITAL_COMMUNITY)
Admission: RE | Admit: 2019-05-08 | Discharge: 2019-05-08 | Disposition: A | Discharge status: Home / Self Care | Payer: BC Managed Care – PPO | Source: Ambulatory Visit | Attending: Surgery | Admitting: Surgery

## 2019-05-08 ENCOUNTER — Other Ambulatory Visit: Payer: Self-pay

## 2019-05-08 ENCOUNTER — Encounter (HOSPITAL_COMMUNITY): Payer: Self-pay

## 2019-05-08 DIAGNOSIS — Z01812 Encounter for preprocedural laboratory examination: Secondary | ICD-10-CM | POA: Insufficient documentation

## 2019-05-08 DIAGNOSIS — G4733 Obstructive sleep apnea (adult) (pediatric): Secondary | ICD-10-CM | POA: Diagnosis not present

## 2019-05-08 LAB — BASIC METABOLIC PANEL
Anion gap: 7 (ref 5–15)
BUN: 14 mg/dL (ref 6–20)
CO2: 27 mmol/L (ref 22–32)
Calcium: 9.3 mg/dL (ref 8.9–10.3)
Chloride: 105 mmol/L (ref 98–111)
Creatinine, Ser: 1.02 mg/dL — ABNORMAL HIGH (ref 0.44–1.00)
GFR calc Af Amer: >60 mL/min (ref >60)
GFR calc non Af Amer: >60 mL/min (ref >60)
Glucose, Bld: 109 mg/dL — ABNORMAL HIGH (ref 70–99)
Potassium: 4.1 mmol/L (ref 3.5–5.1)
Sodium: 139 mmol/L (ref 135–145)

## 2019-05-08 LAB — CBC
HCT: 42.9 % (ref 36.0–46.0)
Hemoglobin: 13.6 g/dL (ref 12.0–15.0)
MCH: 30.9 pg (ref 26.0–34.0)
MCHC: 31.7 g/dL (ref 30.0–36.0)
MCV: 97.5 fL (ref 80.0–100.0)
Platelets: 240 10*3/uL (ref 150–400)
RBC: 4.4 MIL/uL (ref 3.87–5.11)
RDW: 12.2 % (ref 11.5–15.5)
WBC: 8 10*3/uL (ref 4.0–10.5)
nRBC: 0 % (ref 0.0–0.2)

## 2019-05-08 NOTE — Progress Notes (Addendum)
PCP - Dr. Wenda Low Cardiologist - n/a  Chest x-ray - 03/09/2018 2 view EPIC EKG -02/27/2018  EPIC Stress Test - n/a ECHO - n/a Cardiac Cath - n/a  Sleep Study -  CPAP - yes  Fasting Blood Sugar - n/a Checks Blood Sugar __0__ times a day  Blood Thinner Instructions:n/a Aspirin Instructions:n/a Last Dose:n/a  Anesthesia review:   Patient has a history of asthma.  Patient denies shortness of breath, fever, cough and chest pain at PAT appointment   Patient verbalized understanding of instructions that were given to them at the PAT appointment. Patient was also instructed that they will need to review over the PAT instructions again at home before surgery.

## 2019-05-11 DIAGNOSIS — J0100 Acute maxillary sinusitis, unspecified: Secondary | ICD-10-CM | POA: Diagnosis not present

## 2019-05-15 ENCOUNTER — Other Ambulatory Visit (HOSPITAL_COMMUNITY)
Admission: RE | Admit: 2019-05-15 | Discharge: 2019-05-15 | Disposition: A | Discharge status: Home / Self Care | Payer: BC Managed Care – PPO | Source: Ambulatory Visit | Attending: Surgery | Admitting: Surgery

## 2019-05-15 DIAGNOSIS — Z20828 Contact with and (suspected) exposure to other viral communicable diseases: Secondary | ICD-10-CM | POA: Insufficient documentation

## 2019-05-15 DIAGNOSIS — Z01812 Encounter for preprocedural laboratory examination: Secondary | ICD-10-CM | POA: Diagnosis not present

## 2019-05-16 LAB — NOVEL CORONAVIRUS, NAA (HOSP ORDER, SEND-OUT TO REF LAB; TAT 18-24 HRS): SARS-CoV-2, NAA: NOT DETECTED

## 2019-05-18 MED ORDER — BUPIVACAINE LIPOSOME 1.3 % IJ SUSP
20.0000 mL | Freq: Once | INTRAMUSCULAR | Status: DC
  Filled 2019-05-18: qty 20

## 2019-05-18 NOTE — H&P (Signed)
Debra Combs  Location: [facility] Patient #: [id number] DOB: 1965-07-09 Married / Language: English / Race: Black or African American Female  History of Present Illness    The patient is a 53 year old female who presents with a complaint of weight loss surgery.  The PCP is Dr. Tommie Ard  The patient was referred by Dr. Callie Fielding - Elon Alas  She comes by herself.  [The Covid-19 virus has disrupted normal medical care in [place] and across the nation. We have sometimes had to alter normal surgical/medical care to limit this epidemic and we have explained these changes to the patient.]  She is doing well. She is ready for surgery. She has a pretty good understanding of the perioperative care. We talked about Covid. We talked about the current surge and the hospital with Covid. I think the hospital is relatively safe and we will try to get her out of the hospital as soon as possible. She knows to isolate prior to her surgery. UGI - 08/07/2018 - Normal Nutrition - she last saw Sandie Ano on 04/13/2019 Psych - She saw Buena Irish, MSW, on 01/30/2019  History of weight issues: She has been to an information session given by Dr. Windle Guard. She has tried multiple diets including: Weight Watchers, physician weight loss, Keto diet, Slim fast, and [employer]. She had the most luck in weight loss with the Keto diet where she lost about 20 pounds. She has not tried any medication for weight loss.  Per the [place], the patient is a candidate for bariatric surgery. The patient attended our initial information session and reviewed the types of bariatric surgery.  The patient is interested in the sleeve gastrectomy. I discussed with the patient the indications and risks of bariatric surgery. The potential risks of surgery include, but are not limited to, bleeding, infection, leak from the bowel, DVT and PE, open surgery,  long term nutrition consequences, and death. The patient understands the importance of compliance and long term follow-up with our group after surgery. From here we will obtain lab tests, x-rays, nutrition consult, and psych consult.  PLan: 1. For sleeve gastrectomy - 05/19/2019, 2. Protonix and Zofran  Review of Systems as stated in this history (HPI) or in the review of systems. Otherwise all other 12 point ROS are negative  Past Medical History: 1. Remote history of seizures, but they stopped when she was a teenager. She did take phenobarb for a while 2. OSA - sees Dr. Louie Casa 3. GERD She's had an endoscopy, but unsure of the physician 4. Recent colonoscopy by [facility]. 5. Left breast infected cyst - I&D - 06/07/2011 - D. Hermie Reagor 6. Hip and back pain She has seen PT for this, but has trouble doing the exercises 7. 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 Oral Surgery   Diagnostic Studies History (April Staton, [place]; 05/08/2019 2:44 PM) Colonoscopy  within last year never Mammogram  within last year Pap Smear  1-5 years ago  Allergies (April Staton, CMA; 05/08/2019 2:44 PM) Sulfa Antibiotics  Avelox *  FLUOROQUINOLONES*  Zithromax *MACROLIDES*   Medication History (April Staton, CMA; 05/08/2019 2:44 PM) Xyzal (5MG  Tablet, Oral) Active. Spironolactone (25MG  Tablet, Oral) Active. Omeprazole (10MG  Capsule ER, Oral) Active. Dymista (137-50MCG/ACT Suspension, Nasal) Active. Frova (2.5MG  Tablet, Oral) Active. Claritin (10MG  Capsule,  Oral) Active. Qvar (80MCG/ACT Aerosol Soln, Inhalation) Active. Aldactone (25MG  Tablet, Oral) Active. Topamax (25MG  Tablet, Oral) Active. Tretinoin (0.025% Cream, External) Active. Medications Reconciled  Social History (April Staton, CMA; 05/08/2019 2:44 PM) Caffeine use  Carbonated beverages, Tea. No alcohol use  No drug use  Tobacco use  Never smoker.  Family History (April Staton, [place]; 05/08/2019 2:44 PM) Cancer  Father, Mother. Arthritis  Mother. Colon Polyps  Mother. Diabetes Mellitus  Family Members In General, Mother. Heart disease in female family member before age 42  Hypertension  Daughter, Mother, Family Members In General. Heart disease in female family member before age 21  Kidney Disease  Family Members In General, Mother. Respiratory Condition  Father, Mother. Thyroid problems  Mother.  Pregnancy / Birth History (April Staton, [place]; 05/08/2019 2:44 PM) Age at menarche  50 years. Contraceptive History  Oral contraceptives. Gravida  5 Irregular periods  Length (months) of breastfeeding  12-24 Regular periods  Maternal age  22-20 Para  2  Other Problems (April Staton, CMA; 05/08/2019 2:44 PM) Asthma  Anxiety Disorder  Back Pain  Gastroesophageal Reflux Disease  Migraine Headache  Seizure Disorder  Sleep Apnea   Vitals (April Staton CMA; 05/08/2019 2:44 PM) 05/08/2019 2:44 PM Weight: 208.25 lb Height: 63in Body Surface Area: 1.97 m Body Mass Index: 36.89 kg/m  Temp.: 97.76F(Tympanic)  Pulse: 84 (Regular)  BP: 128/82 (Sitting, Left Arm, Standard)   Physical Exam  General: Obese AA F who is alert and generally healthy appearing. She is wearing a mask. Skin: Inspection and palpation of the skin unremarkable.  Eyes: Conjunctivae white, pupils equal. Face, ears, nose, mouth, and throat: Face - she is wearing a mask.  Neck: Supple. No mass. Trachea midline. No thyroid mass.  Lymph Nodes: No  supraclavicular or cervical adenopathy. No axillary adenopathy.  Lungs: Normal respiratory effort. Clear to auscultation and symmetric breath sounds. Cardiovascular: Regular rate and rythm. Normal auscultation of the heart. No murmur or rub.  Abdomen: Soft. No mass. Liver and spleen not palpable. No tenderness. No hernia. Normal bowel sounds. No abdominal scars. She is more apple than pear Rectal: Not done.  Musculoskeletal/extremities: Normal gait. Good strength and ROM in upper and lower extremities.   Assessment & Plan  1.  MORBID OBESITY (E66.01)  PLan:  1. For lap sleeve on 05/19/2019  2. Zofran and Protonix sent in  2.  OSA (OBSTRUCTIVE SLEEP APNEA) (G47.33)  sees Dr. Louie Casa 3. GERD  She's had an endoscopy, but unsure of the physician 4. Hip and back pain She has seen PT for this, but has trouble doing the exercises  Alphonsa Overall, MD, [facility] Office phone:  [phone]

## 2019-05-19 ENCOUNTER — Inpatient Hospital Stay (HOSPITAL_COMMUNITY)
Admission: RE | Admit: 2019-05-19 | Discharge: 2019-05-20 | DRG: 621 | Disposition: A | Discharge status: Home / Self Care | Payer: BC Managed Care – PPO | Attending: Surgery | Admitting: Surgery

## 2019-05-19 ENCOUNTER — Encounter (HOSPITAL_COMMUNITY)
Admission: RE | Disposition: A | Discharge status: Home / Self Care | Payer: Self-pay | Source: Home / Self Care | Attending: Surgery

## 2019-05-19 ENCOUNTER — Other Ambulatory Visit: Payer: Self-pay

## 2019-05-19 ENCOUNTER — Encounter (HOSPITAL_COMMUNITY): Payer: Self-pay | Admitting: Surgery

## 2019-05-19 ENCOUNTER — Inpatient Hospital Stay (HOSPITAL_COMMUNITY): Payer: BC Managed Care – PPO | Admitting: Anesthesiology

## 2019-05-19 ENCOUNTER — Inpatient Hospital Stay (HOSPITAL_COMMUNITY): Payer: BC Managed Care – PPO | Admitting: Physician Assistant

## 2019-05-19 DIAGNOSIS — K219 Gastro-esophageal reflux disease without esophagitis: Secondary | ICD-10-CM | POA: Diagnosis not present

## 2019-05-19 DIAGNOSIS — M549 Dorsalgia, unspecified: Secondary | ICD-10-CM | POA: Diagnosis present

## 2019-05-19 DIAGNOSIS — J45909 Unspecified asthma, uncomplicated: Secondary | ICD-10-CM | POA: Diagnosis present

## 2019-05-19 DIAGNOSIS — K449 Diaphragmatic hernia without obstruction or gangrene: Secondary | ICD-10-CM | POA: Diagnosis not present

## 2019-05-19 DIAGNOSIS — G4733 Obstructive sleep apnea (adult) (pediatric): Secondary | ICD-10-CM | POA: Diagnosis not present

## 2019-05-19 DIAGNOSIS — Z6836 Body mass index (BMI) 36.0-36.9, adult: Secondary | ICD-10-CM | POA: Diagnosis not present

## 2019-05-19 DIAGNOSIS — E669 Obesity, unspecified: Secondary | ICD-10-CM | POA: Diagnosis not present

## 2019-05-19 DIAGNOSIS — Z6837 Body mass index (BMI) 37.0-37.9, adult: Secondary | ICD-10-CM | POA: Diagnosis not present

## 2019-05-19 DIAGNOSIS — F419 Anxiety disorder, unspecified: Secondary | ICD-10-CM | POA: Diagnosis not present

## 2019-05-19 HISTORY — PX: LAPAROSCOPIC GASTRIC SLEEVE RESECTION: SHX5895

## 2019-05-19 LAB — COMPREHENSIVE METABOLIC PANEL
ALT: 16 U/L (ref 0–44)
AST: 15 U/L (ref 15–41)
Albumin: 4.1 g/dL (ref 3.5–5.0)
Alkaline Phosphatase: 64 U/L (ref 38–126)
Anion gap: 9 (ref 5–15)
BUN: 14 mg/dL (ref 6–20)
CO2: 24 mmol/L (ref 22–32)
Calcium: 9.2 mg/dL (ref 8.9–10.3)
Chloride: 105 mmol/L (ref 98–111)
Creatinine, Ser: 0.98 mg/dL (ref 0.44–1.00)
GFR calc Af Amer: >60 mL/min (ref >60)
GFR calc non Af Amer: >60 mL/min (ref >60)
Glucose, Bld: 118 mg/dL — ABNORMAL HIGH (ref 70–99)
Potassium: 3.6 mmol/L (ref 3.5–5.1)
Sodium: 138 mmol/L (ref 135–145)
Total Bilirubin: 0.7 mg/dL (ref 0.3–1.2)
Total Protein: 6.5 g/dL (ref 6.5–8.1)

## 2019-05-19 LAB — TYPE AND SCREEN
ABO/RH(D): O NEG
Antibody Screen: NEGATIVE

## 2019-05-19 LAB — PREGNANCY, URINE: Preg Test, Ur: NEGATIVE

## 2019-05-19 LAB — CBC WITH DIFFERENTIAL/PLATELET
Abs Immature Granulocytes: 0.03 10*3/uL (ref 0.00–0.07)
Basophils Absolute: 0 10*3/uL (ref 0.0–0.1)
Basophils Relative: 0 %
Eosinophils Absolute: 0.1 10*3/uL (ref 0.0–0.5)
Eosinophils Relative: 1 %
HCT: 41.4 % (ref 36.0–46.0)
Hemoglobin: 13.3 g/dL (ref 12.0–15.0)
Immature Granulocytes: 0 %
Lymphocytes Relative: 27 %
Lymphs Abs: 2.4 10*3/uL (ref 0.7–4.0)
MCH: 31 pg (ref 26.0–34.0)
MCHC: 32.1 g/dL (ref 30.0–36.0)
MCV: 96.5 fL (ref 80.0–100.0)
Monocytes Absolute: 0.9 10*3/uL (ref 0.1–1.0)
Monocytes Relative: 10 %
Neutro Abs: 5.3 10*3/uL (ref 1.7–7.7)
Neutrophils Relative %: 62 %
Platelets: 253 10*3/uL (ref 150–400)
RBC: 4.29 MIL/uL (ref 3.87–5.11)
RDW: 12.2 % (ref 11.5–15.5)
WBC: 8.7 10*3/uL (ref 4.0–10.5)
nRBC: 0 % (ref 0.0–0.2)

## 2019-05-19 LAB — HEMOGLOBIN AND HEMATOCRIT, BLOOD
HCT: 42.6 % (ref 36.0–46.0)
Hemoglobin: 13.5 g/dL (ref 12.0–15.0)

## 2019-05-19 LAB — ABO/RH: ABO/RH(D): O NEG

## 2019-05-19 SURGERY — GASTRECTOMY, SLEEVE, LAPAROSCOPIC
Anesthesia: General

## 2019-05-19 MED ORDER — ONDANSETRON HCL 4 MG/2ML IJ SOLN
4.0000 mg | INTRAMUSCULAR | Status: DC | PRN
  Administered 2019-05-19: 4 mg via INTRAVENOUS
  Filled 2019-05-19: qty 2

## 2019-05-19 MED ORDER — GABAPENTIN 300 MG PO CAPS
300.0000 mg | ORAL_CAPSULE | ORAL | Status: AC
  Administered 2019-05-19: 06:00:00 300 mg via ORAL
  Filled 2019-05-19: qty 1

## 2019-05-19 MED ORDER — ACETAMINOPHEN 500 MG PO TABS: 1000.0000 mg | ORAL_TABLET | Freq: Once | ORAL | Status: DC | PRN

## 2019-05-19 MED ORDER — LACTATED RINGERS IR SOLN
Status: DC | PRN
  Administered 2019-05-19: 1000 mL

## 2019-05-19 MED ORDER — ONDANSETRON HCL 4 MG/2ML IJ SOLN
INTRAMUSCULAR | Status: AC
  Filled 2019-05-19: qty 2

## 2019-05-19 MED ORDER — PHENYLEPHRINE 40 MCG/ML (10ML) SYRINGE FOR IV PUSH (FOR BLOOD PRESSURE SUPPORT)
PREFILLED_SYRINGE | INTRAVENOUS | Status: DC | PRN
  Administered 2019-05-19 (×4): 80 ug via INTRAVENOUS

## 2019-05-19 MED ORDER — SODIUM CHLORIDE 0.9 % IV SOLN
2.0000 g | INTRAVENOUS | Status: AC
  Administered 2019-05-19: 2 g via INTRAVENOUS
  Filled 2019-05-19: qty 2

## 2019-05-19 MED ORDER — PHENYLEPHRINE 40 MCG/ML (10ML) SYRINGE FOR IV PUSH (FOR BLOOD PRESSURE SUPPORT)
PREFILLED_SYRINGE | INTRAVENOUS | Status: AC
  Filled 2019-05-19: qty 10

## 2019-05-19 MED ORDER — TISSEEL VH 10 ML EX KIT
PACK | CUTANEOUS | Status: AC
  Filled 2019-05-19: qty 1

## 2019-05-19 MED ORDER — ENOXAPARIN SODIUM 30 MG/0.3ML ~~LOC~~ SOLN
30.0000 mg | Freq: Two times a day (BID) | SUBCUTANEOUS | Status: DC
  Administered 2019-05-19 – 2019-05-20 (×2): 30 mg via SUBCUTANEOUS
  Filled 2019-05-19 (×2): qty 0.3

## 2019-05-19 MED ORDER — ROCURONIUM BROMIDE 10 MG/ML (PF) SYRINGE
PREFILLED_SYRINGE | INTRAVENOUS | Status: DC | PRN
  Administered 2019-05-19: 20 mg via INTRAVENOUS
  Administered 2019-05-19: 70 mg via INTRAVENOUS

## 2019-05-19 MED ORDER — OXYCODONE HCL 5 MG/5ML PO SOLN
5.0000 mg | Freq: Four times a day (QID) | ORAL | Status: DC | PRN
  Administered 2019-05-19: 5 mg via ORAL
  Filled 2019-05-19 (×2): qty 5

## 2019-05-19 MED ORDER — PROPOFOL 10 MG/ML IV BOLUS
INTRAVENOUS | Status: DC | PRN
  Administered 2019-05-19: 160 mg via INTRAVENOUS

## 2019-05-19 MED ORDER — ENSURE MAX PROTEIN PO LIQD
2.0000 [oz_av] | ORAL | Status: DC
  Administered 2019-05-20 (×2): 2 [oz_av] via ORAL

## 2019-05-19 MED ORDER — OXYCODONE HCL 5 MG/5ML PO SOLN: 5.0000 mg | Freq: Once | ORAL | Status: DC | PRN

## 2019-05-19 MED ORDER — MONTELUKAST SODIUM 10 MG PO TABS: 10.0000 mg | ORAL_TABLET | Freq: Every day | ORAL | Status: DC

## 2019-05-19 MED ORDER — LIDOCAINE 2% (20 MG/ML) 5 ML SYRINGE
INTRAMUSCULAR | Status: DC | PRN
  Administered 2019-05-19: 1.5 mg/kg/h via INTRAVENOUS

## 2019-05-19 MED ORDER — KETAMINE HCL 10 MG/ML IJ SOLN
INTRAMUSCULAR | Status: AC
  Filled 2019-05-19: qty 1

## 2019-05-19 MED ORDER — ACETAMINOPHEN 10 MG/ML IV SOLN: 1000.0000 mg | Freq: Once | INTRAVENOUS | Status: DC | PRN

## 2019-05-19 MED ORDER — CHLORHEXIDINE GLUCONATE 4 % EX LIQD: 60.0000 mL | Freq: Once | CUTANEOUS | Status: DC

## 2019-05-19 MED ORDER — LACTATED RINGERS IV SOLN: INTRAVENOUS | Status: DC

## 2019-05-19 MED ORDER — ACETAMINOPHEN 500 MG PO TABS
1000.0000 mg | ORAL_TABLET | ORAL | Status: AC
  Administered 2019-05-19: 06:00:00 1000 mg via ORAL
  Filled 2019-05-19: qty 2

## 2019-05-19 MED ORDER — TISSEEL VH 10 ML EX KIT
PACK | CUTANEOUS | Status: DC | PRN
  Administered 2019-05-19: 10 mL

## 2019-05-19 MED ORDER — MORPHINE SULFATE (PF) 2 MG/ML IV SOLN: 1.0000 mg | INTRAVENOUS | Status: DC | PRN

## 2019-05-19 MED ORDER — LIDOCAINE HCL 2 % IJ SOLN
INTRAMUSCULAR | Status: AC
  Filled 2019-05-19: qty 20

## 2019-05-19 MED ORDER — FENTANYL CITRATE (PF) 100 MCG/2ML IJ SOLN
INTRAMUSCULAR | Status: AC
  Filled 2019-05-19: qty 2

## 2019-05-19 MED ORDER — MIDAZOLAM HCL 2 MG/2ML IJ SOLN
INTRAMUSCULAR | Status: AC
  Filled 2019-05-19: qty 2

## 2019-05-19 MED ORDER — LIDOCAINE 2% (20 MG/ML) 5 ML SYRINGE
INTRAMUSCULAR | Status: DC | PRN
  Administered 2019-05-19: 100 mg via INTRAVENOUS

## 2019-05-19 MED ORDER — PANTOPRAZOLE SODIUM 40 MG IV SOLR
40.0000 mg | Freq: Every day | INTRAVENOUS | Status: DC
  Administered 2019-05-19: 40 mg via INTRAVENOUS
  Filled 2019-05-19: qty 40

## 2019-05-19 MED ORDER — FENTANYL CITRATE (PF) 100 MCG/2ML IJ SOLN
25.0000 ug | INTRAMUSCULAR | Status: DC | PRN
  Administered 2019-05-19 (×3): 50 ug via INTRAVENOUS

## 2019-05-19 MED ORDER — KETAMINE HCL 10 MG/ML IJ SOLN
INTRAMUSCULAR | Status: DC | PRN
  Administered 2019-05-19: 50 mg via INTRAVENOUS

## 2019-05-19 MED ORDER — DEXAMETHASONE SODIUM PHOSPHATE 10 MG/ML IJ SOLN
INTRAMUSCULAR | Status: AC
  Filled 2019-05-19: qty 1

## 2019-05-19 MED ORDER — ONDANSETRON HCL 4 MG/2ML IJ SOLN
INTRAMUSCULAR | Status: DC | PRN
  Administered 2019-05-19: 4 mg via INTRAVENOUS

## 2019-05-19 MED ORDER — ROCURONIUM BROMIDE 10 MG/ML (PF) SYRINGE
PREFILLED_SYRINGE | INTRAVENOUS | Status: AC
  Filled 2019-05-19: qty 10

## 2019-05-19 MED ORDER — APREPITANT 40 MG PO CAPS
40.0000 mg | ORAL_CAPSULE | ORAL | Status: AC
  Administered 2019-05-19: 06:00:00 40 mg via ORAL
  Filled 2019-05-19: qty 1

## 2019-05-19 MED ORDER — SUCCINYLCHOLINE CHLORIDE 200 MG/10ML IV SOSY
PREFILLED_SYRINGE | INTRAVENOUS | Status: AC
  Filled 2019-05-19: qty 10

## 2019-05-19 MED ORDER — SCOPOLAMINE 1 MG/3DAYS TD PT72
1.0000 | MEDICATED_PATCH | TRANSDERMAL | Status: DC
  Administered 2019-05-19: 06:00:00 1.5 mg via TRANSDERMAL
  Filled 2019-05-19: qty 1

## 2019-05-19 MED ORDER — KCL IN DEXTROSE-NACL 20-5-0.45 MEQ/L-%-% IV SOLN
INTRAVENOUS | Status: DC
  Filled 2019-05-19 (×2): qty 1000

## 2019-05-19 MED ORDER — BUPIVACAINE HCL 0.25 % IJ SOLN
INTRAMUSCULAR | Status: AC
  Filled 2019-05-19: qty 1

## 2019-05-19 MED ORDER — ACETAMINOPHEN 500 MG PO TABS
1000.0000 mg | ORAL_TABLET | Freq: Three times a day (TID) | ORAL | Status: DC
  Administered 2019-05-19 – 2019-05-20 (×2): 1000 mg via ORAL
  Filled 2019-05-19 (×2): qty 2

## 2019-05-19 MED ORDER — FENTANYL CITRATE (PF) 250 MCG/5ML IJ SOLN
INTRAMUSCULAR | Status: DC | PRN
  Administered 2019-05-19: 150 ug via INTRAVENOUS
  Administered 2019-05-19: 50 ug via INTRAVENOUS

## 2019-05-19 MED ORDER — SUGAMMADEX SODIUM 200 MG/2ML IV SOLN
INTRAVENOUS | Status: DC | PRN
  Administered 2019-05-19: 220 mg via INTRAVENOUS

## 2019-05-19 MED ORDER — PROPOFOL 10 MG/ML IV BOLUS
INTRAVENOUS | Status: AC
  Filled 2019-05-19: qty 20

## 2019-05-19 MED ORDER — ACETAMINOPHEN 160 MG/5ML PO SOLN: 1000.0000 mg | Freq: Three times a day (TID) | ORAL | Status: DC

## 2019-05-19 MED ORDER — MIDAZOLAM HCL 2 MG/2ML IJ SOLN
INTRAMUSCULAR | Status: DC | PRN
  Administered 2019-05-19: 2 mg via INTRAVENOUS

## 2019-05-19 MED ORDER — FENTANYL CITRATE (PF) 250 MCG/5ML IJ SOLN
INTRAMUSCULAR | Status: AC
  Filled 2019-05-19: qty 5

## 2019-05-19 MED ORDER — BUPIVACAINE HCL 0.25 % IJ SOLN
INTRAMUSCULAR | Status: DC | PRN
  Administered 2019-05-19: 30 mL

## 2019-05-19 MED ORDER — ACETAMINOPHEN 160 MG/5ML PO SOLN: 1000.0000 mg | Freq: Once | ORAL | Status: DC | PRN

## 2019-05-19 MED ORDER — BUPIVACAINE LIPOSOME 1.3 % IJ SUSP
INTRAMUSCULAR | Status: DC | PRN
  Administered 2019-05-19: 20 mL

## 2019-05-19 MED ORDER — HEPARIN SODIUM (PORCINE) 5000 UNIT/ML IJ SOLN
5000.0000 [IU] | INTRAMUSCULAR | Status: AC
  Administered 2019-05-19: 07:00:00 5000 [IU] via SUBCUTANEOUS
  Filled 2019-05-19: qty 1

## 2019-05-19 MED ORDER — LIDOCAINE 2% (20 MG/ML) 5 ML SYRINGE
INTRAMUSCULAR | Status: AC
  Filled 2019-05-19: qty 5

## 2019-05-19 MED ORDER — ALBUTEROL SULFATE (2.5 MG/3ML) 0.083% IN NEBU: 3.0000 mL | INHALATION_SOLUTION | Freq: Four times a day (QID) | RESPIRATORY_TRACT | Status: DC | PRN

## 2019-05-19 MED ORDER — DEXAMETHASONE SODIUM PHOSPHATE 10 MG/ML IJ SOLN
INTRAMUSCULAR | Status: DC | PRN
  Administered 2019-05-19: 10 mg via INTRAVENOUS

## 2019-05-19 MED ORDER — OXYCODONE HCL 5 MG PO TABS: 5.0000 mg | ORAL_TABLET | Freq: Once | ORAL | Status: DC | PRN

## 2019-05-19 SURGICAL SUPPLY — 75 items
ADH SKN CLS APL DERMABOND .7 (GAUZE/BANDAGES/DRESSINGS) ×1
APL PRP STRL LF DISP 70% ISPRP (MISCELLANEOUS) ×1
APL SRG 32X5 SNPLK LF DISP (MISCELLANEOUS) ×1
APL SWBSTK 6 STRL LF DISP (MISCELLANEOUS)
APPLICATOR COTTON TIP 6 STRL (MISCELLANEOUS) IMPLANT
APPLICATOR COTTON TIP 6IN STRL (MISCELLANEOUS)
APPLIER CLIP ROT 10 11.4 M/L (STAPLE)
APPLIER CLIP ROT 13.4 12 LRG (CLIP) ×3
APR CLP LRG 13.4X12 ROT 20 MLT (CLIP) ×1
APR CLP MED LRG 11.4X10 (STAPLE)
BLADE SURG 15 STRL LF DISP TIS (BLADE) ×1 IMPLANT
BLADE SURG 15 STRL SS (BLADE) ×3
CABLE HIGH FREQUENCY MONO STRZ (ELECTRODE) IMPLANT
CHLORAPREP W/TINT 26 (MISCELLANEOUS) ×3 IMPLANT
CLIP APPLIE ROT 10 11.4 M/L (STAPLE) IMPLANT
CLIP APPLIE ROT 13.4 12 LRG (CLIP) IMPLANT
COVER MAYO STAND STRL (DRAPES) ×3 IMPLANT
COVER WAND RF STERILE (DRAPES) IMPLANT
DERMABOND ADVANCED (GAUZE/BANDAGES/DRESSINGS) ×2
DERMABOND ADVANCED .7 DNX12 (GAUZE/BANDAGES/DRESSINGS) ×1 IMPLANT
DEVICE SUT QUICK LOAD TK 5 (STAPLE) ×1 IMPLANT
DEVICE SUT TI-KNOT TK 5X26 (MISCELLANEOUS) ×1 IMPLANT
DEVICE SUTURE ENDOST 10MM (ENDOMECHANICALS) ×2 IMPLANT
DEVICE TI KNOT TK5 (MISCELLANEOUS) ×1
DISSECTOR BLUNT TIP ENDO 5MM (MISCELLANEOUS) IMPLANT
DRAPE UTILITY XL STRL (DRAPES) ×6 IMPLANT
ELECT REM PT RETURN 15FT ADLT (MISCELLANEOUS) ×3 IMPLANT
GLOVE SURG SYN 7.5  E (GLOVE) ×2
GLOVE SURG SYN 7.5 E (GLOVE) ×1 IMPLANT
GLOVE SURG SYN 7.5 PF PI (GLOVE) ×1 IMPLANT
GOWN STRL REUS W/TWL XL LVL3 (GOWN DISPOSABLE) ×9 IMPLANT
GRASPER SUT TROCAR 14GX15 (MISCELLANEOUS) ×2 IMPLANT
HOVERMATT SINGLE USE (MISCELLANEOUS) ×3 IMPLANT
KIT BASIN OR (CUSTOM PROCEDURE TRAY) ×3 IMPLANT
KIT TURNOVER KIT A (KITS) IMPLANT
MARKER SKIN DUAL TIP RULER LAB (MISCELLANEOUS) ×3 IMPLANT
NDL SPNL 22GX3.5 QUINCKE BK (NEEDLE) ×1 IMPLANT
NEEDLE SPNL 22GX3.5 QUINCKE BK (NEEDLE) ×3 IMPLANT
PACK CARDIOVASCULAR III (CUSTOM PROCEDURE TRAY) ×3 IMPLANT
PACK UNIVERSAL I (CUSTOM PROCEDURE TRAY) IMPLANT
PENCIL SMOKE EVACUATOR (MISCELLANEOUS) IMPLANT
QUICK LOAD TK 5 (STAPLE) ×1
RELOAD STAPLE 60 3.6 BLU REG (STAPLE) IMPLANT
RELOAD STAPLE 60 3.8 GOLD REG (STAPLE) IMPLANT
RELOAD STAPLE 60 4.1 GRN THCK (STAPLE) ×2 IMPLANT
RELOAD STAPLER BLUE 60MM (STAPLE) ×2 IMPLANT
RELOAD STAPLER GOLD 60MM (STAPLE) ×2 IMPLANT
RELOAD STAPLER GREEN 60MM (STAPLE) ×2 IMPLANT
SCISSORS LAP 5X35 DISP (ENDOMECHANICALS) ×3 IMPLANT
SEALANT SURGICAL APPL DUAL CAN (MISCELLANEOUS) ×3 IMPLANT
SET IRRIG TUBING LAPAROSCOPIC (IRRIGATION / IRRIGATOR) ×3 IMPLANT
SET TUBE SMOKE EVAC HIGH FLOW (TUBING) ×3 IMPLANT
SHEARS HARMONIC ACE PLUS 45CM (MISCELLANEOUS) ×3 IMPLANT
SLEEVE ADV FIXATION 5X100MM (TROCAR) ×6 IMPLANT
SLEEVE GASTRECTOMY 36FR VISIGI (MISCELLANEOUS) ×3 IMPLANT
SOL ANTI FOG 6CC (MISCELLANEOUS) ×1 IMPLANT
SOLUTION ANTI FOG 6CC (MISCELLANEOUS) ×2
SPONGE LAP 18X18 RF (DISPOSABLE) ×3 IMPLANT
STAPLER ECHELON LONG 60 440 (INSTRUMENTS) ×3 IMPLANT
STAPLER RELOAD BLUE 60MM (STAPLE) ×6
STAPLER RELOAD GOLD 60MM (STAPLE) ×6
STAPLER RELOAD GREEN 60MM (STAPLE) ×6
SUT MNCRL AB 4-0 PS2 18 (SUTURE) ×3 IMPLANT
SUT SURGIDAC NAB ES-9 0 48 120 (SUTURE) ×2 IMPLANT
SUT VICRYL 0 TIES 12 18 (SUTURE) IMPLANT
SYR 10ML ECCENTRIC (SYRINGE) ×3 IMPLANT
SYR CONTROL 10ML LL (SYRINGE) ×3 IMPLANT
TOWEL OR 17X26 10 PK STRL BLUE (TOWEL DISPOSABLE) ×3 IMPLANT
TOWEL OR NON WOVEN STRL DISP B (DISPOSABLE) ×3 IMPLANT
TROCAR ADV FIXATION 12X100MM (TROCAR) ×3 IMPLANT
TROCAR ADV FIXATION 5X100MM (TROCAR) ×3 IMPLANT
TROCAR BLADELESS 15MM (ENDOMECHANICALS) ×3 IMPLANT
TROCAR BLADELESS OPT 5 100 (ENDOMECHANICALS) ×3 IMPLANT
TUBING CONNECTING 10 (TUBING) ×2 IMPLANT
TUBING CONNECTING 10' (TUBING) ×1

## 2019-05-19 NOTE — Anesthesia Procedure Notes (Signed)
Procedure Name: Intubation Date/Time: 05/19/2019 7:30 AM Performed by: Sharlette Dense, CRNA Patient Re-evaluated:Patient Re-evaluated prior to induction Oxygen Delivery Method: Circle system utilized Preoxygenation: Pre-oxygenation with 100% oxygen Induction Type: IV induction Ventilation: Mask ventilation without difficulty and Oral airway inserted - appropriate to patient size Laryngoscope Size: Sabra Heck and 2 Grade View: Grade I Tube type: Oral Tube size: 7.5 mm Number of attempts: 2 Airway Equipment and Method: Stylet Placement Confirmation: ETT inserted through vocal cords under direct vision,  positive ETCO2 and breath sounds checked- equal and bilateral Secured at: 22 cm Tube secured with: Tape Dental Injury: Teeth and Oropharynx as per pre-operative assessment

## 2019-05-19 NOTE — Interval H&P Note (Signed)
History and Physical Interval Note:  05/19/2019 7:13 AM  Debra Combs  has presented today for surgery, with the diagnosis of Morbid Obesity.  The various methods of treatment have been discussed with the patient and family.   Her husband is her contact.  After consideration of risks, benefits and other options for treatment, the patient has consented to  Procedure(s): LAPAROSCOPIC GASTRIC SLEEVE RESECTION, Upper Endo, ERAS Pathway (N/A) as a surgical intervention.  The patient's history has been reviewed, patient examined, no change in status, stable for surgery.  I have reviewed the patient's chart and labs.  Questions were answered to the patient's satisfaction.     Shann Medal

## 2019-05-19 NOTE — Transfer of Care (Signed)
Immediate Anesthesia Transfer of Care Note  Patient: Debra Combs  Procedure(s) Performed: LAPAROSCOPIC GASTRIC SLEEVE RESECTION, SUTURE REPAIR OF HIATAL HERNIA, Upper Endo, ERAS Pathway (N/A )  Patient Location: PACU  Anesthesia Type:General  Level of Consciousness: drowsy  Airway & Oxygen Therapy: Patient Spontanous Breathing and Patient connected to face mask oxygen  Post-op Assessment: Report given to RN and Post -op Vital signs reviewed and stable  Post vital signs: Reviewed and stable  Last Vitals:  Vitals Value Taken Time  BP 129/80 05/19/19 0941  Temp    Pulse 85 05/19/19 0941  Resp 14 05/19/19 0941  SpO2 99 % 05/19/19 0941  Vitals shown include unvalidated device data.  Last Pain:  Vitals:   05/19/19 0627  TempSrc:   PainSc: 0-No pain      Patients Stated Pain Goal: 4 ([id number])  Complications: No apparent anesthesia complications

## 2019-05-19 NOTE — Anesthesia Postprocedure Evaluation (Signed)
Anesthesia Post Note  Patient: SHERRIL ZABLOCKI  Procedure(s) Performed: LAPAROSCOPIC GASTRIC SLEEVE RESECTION, SUTURE REPAIR OF HIATAL HERNIA, Upper Endo, ERAS Pathway (N/A )     Patient location during evaluation: PACU Anesthesia Type: General Level of consciousness: awake and alert Pain management: pain level controlled Vital Signs Assessment: post-procedure vital signs reviewed and stable Respiratory status: spontaneous breathing, nonlabored ventilation, respiratory function stable and patient connected to nasal cannula oxygen Cardiovascular status: blood pressure returned to baseline and stable Postop Assessment: no apparent nausea or vomiting Anesthetic complications: no    Last Vitals:  Vitals:   05/19/19 1045 05/19/19 1100  BP: 133/76 137/81  Pulse: 87 94  Resp: 16 14  Temp: 36.8 C   SpO2: 97% 96%    Last Pain:  Vitals:   05/19/19 1100  TempSrc:   PainSc: Asleep                 Rakeya Glab

## 2019-05-19 NOTE — Discharge Instructions (Signed)
° ° ° °GASTRIC BYPASS/SLEEVE ° Home Care Instructions ° ° These instructions are to help you care for yourself when you go home. ° °Call: If you have any problems. °• Call [phone] and ask for the surgeon on call °• If you need immediate help, come to the ER at [facility].  °• Tell the ER staff that you are a new post-op gastric bypass or gastric sleeve patient °  °Signs and symptoms to report: • Severe vomiting or nausea °o If you cannot keep down clear liquids for longer than 1 day, call your surgeon  °• Abdominal pain that does not get better after taking your pain medication °• Fever over 100.4° F with chills °• Heart beating over 100 beats a minute °• Shortness of breath at rest °• Chest pain °•  Redness, swelling, drainage, or foul odor at incision (surgical) sites °•  If your incisions open or pull apart °• Swelling or pain in calf (lower leg) °• Diarrhea (Loose bowel movements that happen often), frequent watery, uncontrolled bowel movements °• Constipation, (no bowel movements for 3 days) if this happens: Pick one °o Milk of Magnesia, 2 tablespoons by mouth, 3 times a day for 2 days if needed °o Stop taking Milk of Magnesia once you have a bowel movement °o Call your doctor if constipation continues °Or °o Miralax  (instead of Milk of Magnesia) following the label instructions °o Stop taking Miralax once you have a bowel movement °o Call your doctor if constipation continues °• Anything you think is not normal °  °Normal side effects after surgery: • Unable to sleep at night or unable to focus °• Irritability or moody °• Being tearful (crying) or depressed °These are common complaints, possibly related to your anesthesia medications that put you to sleep, stress of surgery, and change in lifestyle.  This usually goes away a few weeks after surgery.  If these feelings continue, call your primary care doctor. °  °Wound Care: You may have surgical glue, steri-strips, or staples over your incisions after  surgery °• Surgical glue:  Looks like a clear film over your incisions and will wear off a little at a time °• Steri-strips: Strips of tape over your incisions. You may notice a yellowish color on the skin under the steri-strips. This is used to make the   steri-strips stick better. Do not pull the steri-strips off - let them fall off °• Staples: Staples may be removed before you leave the hospital °o If you go home with staples, call [facility], [phone] at for an appointment with your surgeon’s nurse to have staples removed 10 days after surgery. °• Showering: You may shower two (2) days after your surgery unless your surgeon tells you differently °o Wash gently around incisions with warm soapy water, rinse well, and gently pat dry  °o No tub baths until staples are removed, steri-strips fall off or glue is gone.  °  °Medications: • Medications should be liquid or crushed if larger than the size of a dime °• Extended release pills (medication that release a little bit at a time through the day) should NOT be crushed or cut. (examples include XL, ER, DR, SR) °• Depending on the size and number of medications you take, you may need to space (take a few throughout the day)/change the time you take your medications so that you do not over-fill your pouch (smaller stomach) °• Make sure you follow-up with your primary care doctor to   make medication changes needed during rapid weight loss and life-style changes °• If you have diabetes, follow up with the doctor that orders your diabetes medication(s) within one week after surgery and check your blood sugar regularly. °• Do not drive while taking prescription pain medication  °• It is ok to take Tylenol by the bottle instructions with your pain medicine or instead of your pain medicine as needed.  DO NOT TAKE NSAIDS (EXAMPLES OF NSAIDS:  IBUPROFREN/ NAPROXEN)  °Diet:                    First 2 Weeks ° You will see the dietician t about two (2) weeks  after your surgery. The dietician will increase the types of foods you can eat if you are handling liquids well: °• If you have severe vomiting or nausea and cannot keep down clear liquids lasting longer than 1 day, call your surgeon @ ([phone]) °Protein Shake °• Drink at least 2 ounces of shake 5-6 times per day °• Each serving of protein shakes (usually 8 - 12 ounces) should have: °o 15 grams of protein  °o And no more than 5 grams of carbohydrate  °• Goal for protein each day: °o Men = 80 grams per day °o Women = 60 grams per day °• Protein powder may be added to fluids such as non-fat milk or Lactaid milk or unsweetened Soy/Almond milk (limit to 35 grams added protein powder per serving) ° °Hydration °• Slowly increase the amount of water and other clear liquids as tolerated (See Acceptable Fluids) °• Slowly increase the amount of protein shake as tolerated  °•  Sip fluids slowly and throughout the day.  Do not use straws. °• May use sugar substitutes in small amounts (no more than 6 - 8 packets per day; i.e. Splenda) ° °Fluid Goal °• The first goal is to drink at least 8 ounces of protein shake/drink per day (or as directed by the nutritionist); some examples of protein shakes are Syntrax Nectar, Adkins Advantage, EAS Edge HP, and Unjury. See handout from pre-op Bariatric Education Class: °o Slowly increase the amount of protein shake you drink as tolerated °o You may find it easier to slowly sip shakes throughout the day °o It is important to get your proteins in first °• Your fluid goal is to drink 64 - 100 ounces of fluid daily °o It may take a few weeks to build up to this °• 32 oz (or more) should be clear liquids  °And  °• 32 oz (or more) should be full liquids (see below for examples) °• Liquids should not contain sugar, caffeine, or carbonation ° °Clear Liquids: °• Water or Sugar-free flavored water (i.e. Fruit H2O, Propel) °• Decaffeinated coffee or tea (sugar-free) °• Crystal Lite, Wyler’s Lite,  Minute Maid Lite °• Sugar-free Jell-O °• Bouillon or broth °• Sugar-free Popsicle:   *Less than 20 calories each; Limit 1 per day ° °Full Liquids: °Protein Shakes/Drinks + 2 choices per day of other full liquids °• Full liquids must be: °o No More Than 15 grams of Carbs per serving  °o No More Than 3 grams of Fat per serving °• Strained low-fat cream soup (except Cream of Potato or Tomato) °• Non-Fat milk °• Fat-free Lactaid Milk °• Unsweetened Soy Or Unsweetened Almond Milk °• Low Sugar yogurt (Dannon Lite & Fit, Greek yogurt; Oikos Triple Zero; Chobani Simply 100; Yoplait 100 calorie Greek - No Fruit on the Bottom) ° °  °Vitamins   and Minerals • Start 1 day after surgery unless otherwise directed by your surgeon °• 2 Chewable Bariatric Specific Multivitamin / Multimineral Supplement with iron (Example: Bariatric Advantage Multi EA) °• Chewable Calcium with Vitamin D-3 °(Example: 3 Chewable Calcium Plus 600 with Vitamin D-3) °o Take 500 mg three (3) times a day for a total of 1500 mg each day °o Do not take all 3 doses of calcium at one time as it may cause constipation, and you can only absorb 500 mg  at a time  °o Do not mix multivitamins containing iron with calcium supplements; take 2 hours apart °• Menstruating women and those with a history of anemia (a blood disease that causes weakness) may need extra iron °o Talk with your doctor to see if you need more iron °• Do not stop taking or change any vitamins or minerals until you talk to your dietitian or surgeon °• Your Dietitian and/or surgeon must approve all vitamin and mineral supplements °  °Activity and Exercise: Limit your physical activity as instructed by your doctor.  It is important to continue walking at home.  During this time, use these guidelines: °• Do not lift anything greater than ten (10) pounds for at least two (2) weeks °• Do not go back to work or drive until your surgeon says you can °• You may have sex when you feel comfortable  °o It is  VERY important for female patients to use a reliable birth control method; fertility often increases after surgery  °o All hormonal birth control will be ineffective for 30 days after surgery due to medications given during surgery a barrier method must be used. °o Do not get pregnant for at least 18 months °• Start exercising as soon as your doctor tells you that you can °o Make sure your doctor approves any physical activity °• Start with a simple walking program °• Walk 5-15 minutes each day, 7 days per week.  °• Slowly increase until you are walking 30-45 minutes per day °Consider joining our BELT program. [phone] or email [email or web address] °  °Special Instructions Things to remember: °• Use your CPAP when sleeping if this applies to you ° °• [facility] has two free Bariatric Surgery Support Groups that meet monthly °o The 3rd Thursday of each month, 6 pm, [facility]  °o The 2nd Friday of each month, 11:45 am in the private dining room in the basement of [place] °• It is very important to keep all follow up appointments with your surgeon, dietitian, primary care physician, and behavioral health practitioner °• Routine follow up schedule with your surgeon include appointments at 2-3 weeks, 6-8 weeks, 6 months, and 1 year at a minimum.  Your surgeon may request to see you more often.   °o After the first year, please follow up with your bariatric surgeon and dietitian at least once a year in order to maintain best weight loss results °[facility]: [phone] °[facility]: [phone] °Bariatric Nurse Coordinator: [phone] °  °   Reviewed and Endorsed  °by [facility] Patient Education Committee, June, 2016 °Edits Approved: Aug, 2018 ° ° ° °

## 2019-05-19 NOTE — Progress Notes (Signed)
Discussed post op day goals with patient including ambulation, IS, diet progression, pain, and nausea control.  BSTOP education provided including BSTOP information guide, "Guide for Pain Management after your Bariatric Procedure".  Questions answered. 

## 2019-05-19 NOTE — Anesthesia Preprocedure Evaluation (Addendum)
Anesthesia Evaluation  Patient identified by MRN, date of birth, ID band Patient awake    Reviewed: Allergy & Precautions, NPO status , Patient's Chart, lab work & pertinent test results  History of Anesthesia Complications Negative for: history of anesthetic complications  Airway Mallampati: III  TM Distance: <3 FB Neck ROM: Full  Mouth opening: Limited Mouth Opening  Dental  (+) Dental Advisory Given, Teeth Intact   Pulmonary asthma , neg recent URI,    breath sounds clear to auscultation       Cardiovascular negative cardio ROS   Rhythm:Regular     Neuro/Psych  Headaches, neg Seizures PSYCHIATRIC DISORDERS Anxiety    GI/Hepatic Neg liver ROS, GERD  Medicated and Controlled,  Endo/Other  Morbid obesity  Renal/GU negative Renal ROS     Musculoskeletal   Abdominal   Peds  Hematology negative hematology ROS (+)   Anesthesia Other Findings   Reproductive/Obstetrics                            Anesthesia Physical Anesthesia Plan  ASA: III  Anesthesia Plan: General   Post-op Pain Management:    Induction: Intravenous  PONV Risk Score and Plan: 3 and Ondansetron, Dexamethasone, Propofol infusion, Scopolamine patch - Pre-op and Midazolam  Airway Management Planned: Oral ETT  Additional Equipment: None  Intra-op Plan:   Post-operative Plan: Extubation in OR  Informed Consent: I have reviewed the patients History and Physical, chart, labs and discussed the procedure including the risks, benefits and alternatives for the proposed anesthesia with the patient or authorized representative who has indicated his/her understanding and acceptance.     Dental advisory given  Plan Discussed with: CRNA and Surgeon  Anesthesia Plan Comments:         Anesthesia Quick Evaluation

## 2019-05-19 NOTE — Op Note (Signed)
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 Hassell Done  M.D., FACS   Anesthesia: Gen.   Indications for procedure: This patient was undergoing a sleeve gastrectomy and a hiatal hernia was noted and repaired.  Need to check anatomy, sleeve geometry and for leaks and bleeds.    Description of procedure: The endoscopy was placed in the mouth and into the oropharynx and under endoscopic vision it was advanced to the esophagogastric junction.  The pouch was insufflated and a nice cylindrical sleeve was seen.  Some retained food was noted.   No bleeding or leaks were detected.  The scope was withdrawn without difficulty.     Matt B. Hassell Done, MD, FACS General, Bariatric, & Minimally Invasive Surgery [facility], [place]

## 2019-05-19 NOTE — Op Note (Addendum)
PATIENT:   Debra Combs DOB:   [birth year] MRN:   [id number]  DATE OF PROCEDURE: 05/19/2019                   FACILITY:  [facility]  OPERATIVE REPORT  PREOPERATIVE DIAGNOSIS:  Morbid obesity.  POSTOPERATIVE DIAGNOSIS:  Morbid obesity (weight 208, BMI of 36.9), hiatal hernia  PROCEDURE:  Laparoscopic sleeve gastrectomy (intraoperative upper endoscopy by Rockne Coons), posterior suture repair of hiatal hernia  SURGEON:  Fenton Malling. Lucia Gaskins, MD  FIRST ASSISTANT:  Rockne Coons, MD  ANESTHESIA:  General endotracheal.  Anesthesiologist: Oleta Mouse, MD CRNA: Mitzie Na, CRNA; Sharlette Dense, CRNA  General  ESTIMATED BLOOD LOSS:  Minimal.  LOCAL ANESTHESIA:  30 cc of 1/4% Marcaine and 20 cc of Exparel  COMPLICATIONS:  None.  INDICATION FOR SURGERY:  Debra Combs is a 53 y.o. AA female who sees Wenda Low, MD as her primary care doctor.  She has completed our preoperative bariatric program and now comes for a laparoscopic sleeve gastrectomy.  The indications, potential complications of surgery were explained to the patient.  Potential complications of the surgery include, but are not limited to, bleeding, infection, DVT, open surgery, and long-term nutritional consequences.  OPERATIVE NOTE:  The patient taken to room #1 at [facility] where Ms. Bai underwent a general endotracheal anesthetic, supervised by Anesthesiologist: Oleta Mouse, MD CRNA: Mitzie Na, CRNA; Sharlette Dense, CRNA.  The patient was given 2 g of cefotetan at the beginning of the procedure.  A time-out was held and surgical checklist run.  I accessed her abdominal cavity through the left upper quadrant with a 5 mm Optiview. I did an abdominal exploration.   Her omentum and bowel were unremarkable. The right and left lobes of the liver unremarkable. Gallbladder was normal. Her stomach was unremarkable.   I placed a total of 5 trocars. I placed a 5 mm left lateral trocar, a 5 mm left paramedian  trocar (for the scope), a 12 mm right paramedian torcar, a 5 mm right subcostal trocar that I converted to a 15 mm to extract the stomach and 5 mm subxiphoid trocar for the liver retractor.  I placed a abdominal wall anesthetic block using a mixture of 1/4% Marcaine and Exparel.  I used 20 cc per side, for a total of 40 cc.  Because she has some GERD symptoms, I checked her for a hiatal hernia.  I passed the balloon measuring device.  I insufflated 10 ml of air in the balloon and this pulled through the hiatal hernia.  I did a posterior repair of the hiatal hernia with a single 0 Ethibond suture.  I then rechecked the hiatus with the balloon device using 10 ml of air and the balloon hung up.  I started out taking down the greater curvature attachments of the stomach. I measured approximately 6 cm proximal from the pylorus and mobilized the greater curvature of the stomach with the Harmonic Scalpel. I took this dissection cranially around the greater curvature of her stomach to the angle of His and the left crus.   After I had mobilized the greater curvature of the stomach, I then passed the 36 French ViSiGi bougie which was used to suck the stomach up against the lesser curvature and the tip of the ViSiGi was placed into the antrum. During the staple firing,  I tried to give the [place] a cuff at least about 1 cm. I tried to avoid narrowing the  incisura. I used a total of 6 staple firings.  From antrum to the angle of His, I used 2 Kalina, 2 gold and 2 blue Eschelon 60 mm Ethicon staplers. I did not use staple line reinforcement.   At each firing of the EndoGIA stapler, I inspected the stomach, anterior wall of the stomach, and underneath to make sure there was no compromise or impingement on to the ViSiGi bougie.   The staple line seemed linear without any corkscrewing of the stomach. Hemostasis was good. I did not use any reinforcement. She did have at least 3 areas of bleeding which I used clips to clip  on the new greater curvature of the stomach.  Because I thought we had a good staple line, I then had the ViSiGi was converted to insufflate the pouch. The new stomach pouch was placed under water. There was no bubbling or leak noted.   At this point, Dr. Hassell Done broke scrub and passed an upper endoscope down through the esophagus into the stomach pouch. The stomach was tubular. There was no narrowing of the stomach pouch or angulation. We were easily able to pass the endoscope into the antrum and again put air pressure on the staple line. I irrigated the upper abdomen with saline. There was no bubbling or evidence of air leak. The mucosa looked viable. Dr. Hassell Done decompressed the stomach with the endoscopy.   I converted to right subcostal trocar to a 15 trocar and extracted the stomach remnant through this intact and sent this to pathology. I then placed 10 cc of Tisseel along the new greater curvature staple line and covered the entire staple line with the Tisseel.  I aspirated out the saline that I had irrigated because I thought the staple line looked viable and complete. There was no evidence of leak. I did not leave a drain in place.   Then, I closed the trocar sites. I placed 0 Vicryl sutures at the 15-mm port site in the right upper quadrant. The other port sites seemed smaller not requiring sutures. I closed the skin at each site with a 4-0 Monocryl, painted each wound with Dermabond.   I have a surgeon as a first assist to retract, expose, and assist on this difficult operation.  The patient was transported to recovery room in good condition. Sponge and needle count were correct at the end of the case.    Upper picture:  Sleeve gastrectomy Lower picture:  Sleeve gastrectomy with Tissell.   Alphonsa Overall, MD, [facility] Pager: [phone] Office phone:  [phone]

## 2019-05-19 NOTE — Progress Notes (Signed)
PHARMACY CONSULT FOR:  Risk Assessment for Post-Discharge VTE Following Bariatric Surgery  Post-Discharge VTE Risk Assessment: This patient's probability of 30-day post-discharge VTE is increased due to the factors marked:   Female    Age >/=60 years    BMI >/=50 kg/m2    CHF    Dyspnea at Rest    Paraplegia  X  Non-gastric-band surgery    Operation Time >/=3 hr    Return to OR     Length of Stay >/= 3 d      Hx of VTE   Hypercoagulable condition   Significant venous stasis   Predicted probability of 30-day post-discharge VTE: 0.16%  Other patient-specific factors to consider:  Recommendation for Discharge: No pharmacologic prophylaxis post-discharge  Debra Combs is a 53 y.o. female who underwent  Gastric sleeve resection on 12/22   Case start: 0752 Case end: 0931   Allergies  Allergen Reactions  . Avelox [Moxifloxacin Hcl In Nacl] Nausea Only    Severe stomach ache  . Chlorhexidine Gluconate     Minor redness and itching  . Nitrofurantoin Monohyd Macro   . Sulfa Antibiotics   . Zithromax [Azithromycin Dihydrate] Nausea And Vomiting    Patient Measurements: Height: 5\' 2"  (157.5 cm) Weight: 205 lb 6.4 oz (93.2 kg) IBW/kg (Calculated) : 50.1 Body mass index is 37.57 kg/m.  Recent Labs    05/19/19 0615 05/19/19 1210  WBC 8.7  --   HGB 13.3 13.5  HCT 41.4 42.6  PLT 253  --   CREATININE 0.98  --   ALBUMIN 4.1  --   PROT 6.5  --   AST 15  --   ALT 16  --   ALKPHOS 64  --   BILITOT 0.7  --    Estimated Creatinine Clearance: 71.3 mL/min (by C-G formula based on SCr of 0.98 mg/dL).  Past Medical History:  Diagnosis Date  . Acne   . Anxiety   . Asthma   . Breast cyst    left breast  . Cervical dysplasia 1997  . Dizziness   . GERD (gastroesophageal reflux disease)   . HA (headache)   . Post-concussion headache 07/21/2014   Medications Prior to Admission  Medication Sig Dispense Refill Last Dose  . acetaminophen (TYLENOL) 500 MG tablet Take  1,000 mg by mouth every 6 (six) hours as needed for moderate pain.   Past Week at Unknown time  . albuterol (VENTOLIN HFA) 108 (90 Base) MCG/ACT inhaler Inhale 1-2 puffs into the lungs every 6 (six) hours as needed for wheezing or shortness of breath.   Past Week at Unknown time  . APPLE CIDER VINEGAR PO Take by mouth.   05/18/2019 at Unknown time  . Azelastine-Fluticasone (DYMISTA) 137-50 MCG/ACT SUSP Place 1 spray into the nose daily as needed (allergies).   Past Week at Unknown time  . Benzoyl Peroxide (PANOXYL CREAMY WASH EX) Apply 1 application topically daily.   Past Week at Unknown time  . Biotin 5000 MCG TABS Take 5,000 mcg by mouth daily.   05/18/2019 at Unknown time  . Butalbital-APAP-Caffeine (FIORICET) 50-300-40 MG CAPS Take 1 capsule by mouth daily as needed (headache).   Past Week at Unknown time  . Cholecalciferol (VITAMIN D) 125 MCG (5000 UT) CAPS Take 5,000 Units by mouth daily.   05/18/2019 at Unknown time  . Dapsone 5 % topical gel Apply 1 application topically every morning.   05/18/2019 at Unknown time  . DOXYCYCLINE PO Take by mouth. For  sinus infection   05/17/2019  . fluticasone furoate-vilanterol (BREO ELLIPTA) 100-25 MCG/INH AEPB Inhale 1 puff into the lungs at bedtime.    05/18/2019 at Unknown time  . levocetirizine (XYZAL) 5 MG tablet Take 5 mg by mouth every evening.   05/18/2019 at Unknown time  . montelukast (SINGULAIR) 10 MG tablet Take 10 mg by mouth at bedtime.   05/18/2019 at Unknown time  . omeprazole (PRILOSEC) 20 MG capsule Take 20 mg by mouth daily as needed (acid reflux).    Past Week at Unknown time  . spironolactone (ALDACTONE) 25 MG tablet Take 25 mg by mouth daily.     05/18/2019 at Unknown time  . UNABLE TO FIND Elderberry gummy   05/18/2019 at Unknown time    Minda Ditto PharmD 05/19/2019,1:42 PM

## 2019-05-20 LAB — CBC WITH DIFFERENTIAL/PLATELET
Abs Immature Granulocytes: 0.11 10*3/uL — ABNORMAL HIGH (ref 0.00–0.07)
Basophils Absolute: 0 10*3/uL (ref 0.0–0.1)
Basophils Relative: 0 %
Eosinophils Absolute: 0 10*3/uL (ref 0.0–0.5)
Eosinophils Relative: 0 %
HCT: 39.9 % (ref 36.0–46.0)
Hemoglobin: 12.6 g/dL (ref 12.0–15.0)
Immature Granulocytes: 1 %
Lymphocytes Relative: 7 %
Lymphs Abs: 1.3 10*3/uL (ref 0.7–4.0)
MCH: 31.2 pg (ref 26.0–34.0)
MCHC: 31.6 g/dL (ref 30.0–36.0)
MCV: 98.8 fL (ref 80.0–100.0)
Monocytes Absolute: 1.2 10*3/uL — ABNORMAL HIGH (ref 0.1–1.0)
Monocytes Relative: 7 %
Neutro Abs: 15.6 10*3/uL — ABNORMAL HIGH (ref 1.7–7.7)
Neutrophils Relative %: 85 %
Platelets: 242 10*3/uL (ref 150–400)
RBC: 4.04 MIL/uL (ref 3.87–5.11)
RDW: 12.3 % (ref 11.5–15.5)
WBC: 18.2 10*3/uL — ABNORMAL HIGH (ref 4.0–10.5)
nRBC: 0 % (ref 0.0–0.2)

## 2019-05-20 LAB — SURGICAL PATHOLOGY

## 2019-05-20 MED ORDER — OXYCODONE HCL 5 MG PO TABS: 5.0000 mg | ORAL_TABLET | Freq: Four times a day (QID) | ORAL | 0 refills | Status: DC | PRN

## 2019-05-20 NOTE — Progress Notes (Signed)
Discharge instructions discussed with patient and family verbalized agreement and understanding

## 2019-05-20 NOTE — Progress Notes (Signed)
This chaplain responded to Pt. spiritual care consult for Advance Directive.  The Pt. is anticipating discharge in the near future.  The chaplain was able to leave the document with the Pt. and explain the process of completing the document with the Pt.  Spiritual Care is available for F/U as needed.

## 2019-05-20 NOTE — Discharge Summary (Signed)
Physician Discharge Summary  Patient ID:  Debra Combs  MRN: [id number]  DOB/AGE: 52-Feb-1967 52 y.o.  Admit date: 05/19/2019 Discharge date: 05/20/2019  Discharge Diagnoses:  1.  MORBID OBESITY   Weight - 208, BMI - 36.9 2.  OSA (OBSTRUCTIVE SLEEP APNEA)              sees Dr. Louie Casa 3. Hiatal hernia with GERD 4. Hip and back pain   Active Problems:   Morbid obesity ([place])  Operation: Procedure(s):  LAPAROSCOPIC GASTRIC SLEEVE RESECTION, SUTURE REPAIR OF HIATAL HERNIA on 05/19/2019 - D. Lucia Gaskins  Discharged Condition: good  Hospital Course: Debra Combs is an 53 y.o. female whose primary care physician is Wenda Low, MD and who was admitted 05/19/2019 with a chief complaint of morbid obesity.   She was brought to the operating room on 05/19/2019 and underwent  LAPAROSCOPIC GASTRIC SLEEVE RESECTION, SUTURE REPAIR OF HIATAL HERNIA.   She is doing well.  She has some epigastric discomfort, but has done well with her liquids. She is ready for discharge.  The discharge instructions were reviewed with the patient.  Consults: None  Significant Diagnostic Studies: Results for orders placed or performed during the hospital encounter of 05/19/19  CBC WITH DIFFERENTIAL  Result Value Ref Range   WBC 8.7 4.0 - 10.5 K/uL   RBC 4.29 3.87 - 5.11 MIL/uL   Hemoglobin 13.3 12.0 - 15.0 g/dL   HCT 41.4 36.0 - 46.0 %   MCV 96.5 80.0 - 100.0 fL   MCH 31.0 26.0 - 34.0 pg   MCHC 32.1 30.0 - 36.0 g/dL   RDW 12.2 11.5 - 15.5 %   Platelets 253 150 - 400 K/uL   nRBC 0.0 0.0 - 0.2 %   Neutrophils Relative % 62 %   Neutro Abs 5.3 1.7 - 7.7 K/uL   Lymphocytes Relative 27 %   Lymphs Abs 2.4 0.7 - 4.0 K/uL   Monocytes Relative 10 %   Monocytes Absolute 0.9 0.1 - 1.0 K/uL   Eosinophils Relative 1 %   Eosinophils Absolute 0.1 0.0 - 0.5 K/uL   Basophils Relative 0 %   Basophils Absolute 0.0 0.0 - 0.1 K/uL   Immature Granulocytes 0 %   Abs Immature Granulocytes 0.03 0.00 - 0.07 K/uL   Comprehensive metabolic panel  Result Value Ref Range   Sodium 138 135 - 145 mmol/L   Potassium 3.6 3.5 - 5.1 mmol/L   Chloride 105 98 - 111 mmol/L   CO2 24 22 - 32 mmol/L   Glucose, Bld 118 (H) 70 - 99 mg/dL   BUN 14 6 - 20 mg/dL   Creatinine, Ser 0.98 0.44 - 1.00 mg/dL   Calcium 9.2 8.9 - 10.3 mg/dL   Total Protein 6.5 6.5 - 8.1 g/dL   Albumin 4.1 3.5 - 5.0 g/dL   AST 15 15 - 41 U/L   ALT 16 0 - 44 U/L   Alkaline Phosphatase 64 38 - 126 U/L   Total Bilirubin 0.7 0.3 - 1.2 mg/dL   GFR calc non Af Amer >60 >60 mL/min   GFR calc Af Amer >60 >60 mL/min   Anion gap 9 5 - 15  Pregnancy, urine STAT morning of surgery  Result Value Ref Range   Preg Test, Ur NEGATIVE NEGATIVE  Hemoglobin and hematocrit, blood  Result Value Ref Range   Hemoglobin 13.5 12.0 - 15.0 g/dL   HCT 42.6 36.0 - 46.0 %  CBC WITH DIFFERENTIAL  Result Value Ref  Range   WBC 18.2 (H) 4.0 - 10.5 K/uL   RBC 4.04 3.87 - 5.11 MIL/uL   Hemoglobin 12.6 12.0 - 15.0 g/dL   HCT 39.9 36.0 - 46.0 %   MCV 98.8 80.0 - 100.0 fL   MCH 31.2 26.0 - 34.0 pg   MCHC 31.6 30.0 - 36.0 g/dL   RDW 12.3 11.5 - 15.5 %   Platelets 242 150 - 400 K/uL   nRBC 0.0 0.0 - 0.2 %   Neutrophils Relative % 85 %   Neutro Abs 15.6 (H) 1.7 - 7.7 K/uL   Lymphocytes Relative 7 %   Lymphs Abs 1.3 0.7 - 4.0 K/uL   Monocytes Relative 7 %   Monocytes Absolute 1.2 (H) 0.1 - 1.0 K/uL   Eosinophils Relative 0 %   Eosinophils Absolute 0.0 0.0 - 0.5 K/uL   Basophils Relative 0 %   Basophils Absolute 0.0 0.0 - 0.1 K/uL   Immature Granulocytes 1 %   Abs Immature Granulocytes 0.11 (H) 0.00 - 0.07 K/uL  Type and screen  Result Value Ref Range   ABO/RH(D) O NEG    Antibody Screen NEG    Sample Expiration      05/22/2019,2359 Performed at [facility] [address] [place], [place] [address]   ABO/Rh  Result Value Ref Range   ABO/RH(D)      O NEG Performed at [facility] [address] [place],  [place] [address]     No results found.  Discharge Exam:  Vitals:   05/20/19 0138 05/20/19 0443  BP: 139/62 140/71  Pulse: (!) 107 100  Resp: 18 20  Temp: 98.8 F (37.1 C) 99 F (37.2 C)  SpO2: 97% 98%    General: Obese AA F who is alert and generally healthy appearing.  Lungs: Clear to auscultation and symmetric breath sounds.  IS = 900 ml (she knows to work on this at home) Heart:  RRR. No murmur or rub. Abdomen: Soft. No mass.  No hernia. Some bowel sounds. Incisions looks good.  Discharge Medications:   Allergies as of 05/20/2019      Reactions   Avelox [moxifloxacin Hcl In Nacl] Nausea Only   Severe stomach ache   Chlorhexidine Gluconate    Minor redness and itching   Nitrofurantoin Monohyd Macro    Sulfa Antibiotics    Zithromax [azithromycin Dihydrate] Nausea And Vomiting      Medication List    TAKE these medications   acetaminophen 500 MG tablet Commonly known as: TYLENOL Take 1,000 mg by mouth every 6 (six) hours as needed for moderate pain.   albuterol 108 (90 Base) MCG/ACT inhaler Commonly known as: VENTOLIN HFA Inhale 1-2 puffs into the lungs every 6 (six) hours as needed for wheezing or shortness of breath.   APPLE CIDER VINEGAR PO Take by mouth.   Biotin 5000 MCG Tabs Take 5,000 mcg by mouth daily.   Breo Ellipta 100-25 MCG/INH Aepb Generic drug: fluticasone furoate-vilanterol Inhale 1 puff into the lungs at bedtime.   Dapsone 5 % topical gel Apply 1 application topically every morning.   DOXYCYCLINE PO Take by mouth. For sinus infection   Dymista 137-50 MCG/ACT Susp Generic drug: Azelastine-Fluticasone Place 1 spray into the nose daily as needed (allergies).   Fioricet 50-300-40 MG Caps Generic drug: Butalbital-APAP-Caffeine Take 1 capsule by mouth daily as needed (headache).   levocetirizine 5 MG tablet Commonly known as: XYZAL Take 5 mg by mouth every evening.  montelukast 10 MG tablet Commonly known as: SINGULAIR Take 10 mg by  mouth at bedtime.   omeprazole 20 MG capsule Commonly known as: PRILOSEC Take 20 mg by mouth daily as needed (acid reflux).   oxyCODONE 5 MG immediate release tablet Commonly known as: Oxy IR/ROXICODONE Take 1 tablet (5 mg total) by mouth every 6 (six) hours as needed for severe pain.   PANOXYL CREAMY WASH EX Apply 1 application topically daily.   spironolactone 25 MG tablet Commonly known as: ALDACTONE Take 25 mg by mouth daily. Notes to patient: Monitor Blood Pressure Daily and keep a log for primary care physician.  Monitor for symptoms of dehydration.  You may need to make changes to your medications with rapid weight loss.     UNABLE TO FIND Elderberry gummy   Vitamin D 125 MCG (5000 UT) Caps Take 5,000 Units by mouth daily.       Disposition: Discharge disposition: 01-Home or Self Care       Discharge Instructions    Ambulate hourly while awake   Complete by: As directed    Call MD for:  difficulty breathing, headache or visual disturbances   Complete by: As directed    Call MD for:  persistant dizziness or light-headedness   Complete by: As directed    Call MD for:  persistant nausea and vomiting   Complete by: As directed    Call MD for:  redness, tenderness, or signs of infection (pain, swelling, redness, odor or Bensch/yellow discharge around incision site)   Complete by: As directed    Call MD for:  severe uncontrolled pain   Complete by: As directed    Call MD for:  temperature >101 F   Complete by: As directed    Diet bariatric full liquid   Complete by: As directed    Incentive spirometry   Complete by: As directed    Perform hourly while awake      Follow-up Information    Alphonsa Overall, MD. Go on 06/11/2019.   Specialty: General Surgery Why: at [address] Contact information: [address] [place] [address] [phone]        Carlena Hurl, PA-C. Go on 07/03/2019.   Specialty: General Surgery Why: at [address] Contact  information: [place]  [address] [phone]            Signed: Alphonsa Overall, M.D., [facility] Office:  [phone]  05/20/2019, 7:53 AM

## 2019-05-20 NOTE — Progress Notes (Signed)
Patient alert and oriented, pain is controlled. Patient is tolerating fluids, advanced to protein shake today, patient is tolerating well.  Reviewed Gastric sleeve discharge instructions with patient and patient is able to articulate understanding.  Provided information on BELT program, Support Group and [facility] outpatient pharmacy. All questions answered, will continue to monitor.  Total fluid intake 780 Per dehydration protocol call back one week post op 

## 2019-05-20 NOTE — Progress Notes (Signed)
Nutrition Note  RD consulted for diet education for patient s/p bariatric surgery. Bariatric nurse coordinator providing education at this time.  If nutrition issues arise, please consult RD.   Alisabeth Selkirk, MS, RD, LDN Inpatient Clinical Dietitian Pager: [phone] After Hours Pager: [phone]  

## 2019-05-25 ENCOUNTER — Telehealth (HOSPITAL_COMMUNITY): Payer: Self-pay

## 2019-05-25 NOTE — Telephone Encounter (Signed)
Patient called to discuss post bariatric surgery follow up questions.  See below:   1.  Tell me about your pain and pain management? some pain upper abdomen  2.  Let's talk about fluid intake.  How much total fluid are you taking in? 64 ounces  3.  How much protein have you taken in the last 2 days? 60 grams  4.  Have you had nausea?  Tell me about when have experienced nausea and what you did to help? sometimes  5.  Has the frequency or color changed with your urine?light in color  6.  Tell me what your incisions look like?no problems  7.  Have you been passing gas? BM?passing gas had bm  8.  If a problem or question were to arise who would you call?  Do you know contact numbers for [facility], [facility], and [facility]? aware of how to contact all services  9.  How has the walking going?walking u and down the stairs  10.  How are your vitamins and calcium going?  How are you taking them?mvi and calcium without difficulty

## 2019-05-26 ENCOUNTER — Ambulatory Visit (INDEPENDENT_AMBULATORY_CARE_PROVIDER_SITE_OTHER): Payer: BC Managed Care – PPO | Admitting: Psychology

## 2019-06-02 ENCOUNTER — Other Ambulatory Visit: Payer: Self-pay

## 2019-06-02 ENCOUNTER — Encounter: Payer: BC Managed Care – PPO | Attending: Surgery | Admitting: Skilled Nursing Facility1

## 2019-06-02 DIAGNOSIS — E669 Obesity, unspecified: Secondary | ICD-10-CM | POA: Insufficient documentation

## 2019-06-03 NOTE — Progress Notes (Addendum)
2 Week Post-Operative Nutrition Class   Patient was seen on 06/02/2019 for Post-Operative Nutrition education at the [facility].    Surgery date: 05/19/2019 Surgery type: sleeve Start weight at [facility]: 204 Weight today: 188.5   Body Composition Scale 06/02/2019  Total Body Fat % 40.1  Visceral Fat 12  Fat-Free Mass % 59.8   Total Body Water % 44.4   Muscle-Mass lbs 28.8  Body Fat Displacement          Torso  lbs 46.7         Left Leg  lbs 9.3         Right Leg  lbs 9.3         Left Arm  lbs 4.6         Right Arm   lbs 4.6     The following the learning objectives were met by the patient during this course:  Identifies Phase 3 (Soft, High Proteins) Dietary Goals and will begin from 2 weeks post-operatively to 2 months post-operatively  Identifies appropriate sources of fluids and proteins   States protein recommendations and appropriate sources post-operatively  Identifies the need for appropriate texture modifications, mastication, and bite sizes when consuming solids  Identifies appropriate multivitamin and calcium sources post-operatively  Describes the need for physical activity post-operatively and will follow MD recommendations  States when to call healthcare provider regarding medication questions or post-operative complications   Handouts given during class include:  Phase 3A: Soft, High Protein Diet Handout   Follow-Up Plan: Patient will follow-up at [facility] in 6 weeks for 2 month post-op nutrition visit for diet advancement per MD.

## 2019-06-08 ENCOUNTER — Telehealth: Payer: Self-pay | Admitting: Skilled Nursing Facility1

## 2019-06-08 NOTE — Telephone Encounter (Signed)
RD called pt to verify fluid intake once starting soft, solid proteins 2 week post-bariatric surgery.   Daily Fluid intake: 45 oz Daily Protein intake: 60+  Concerns/issues:   Pt states it has been difficult to time out drinking and eating.   Dietitian advised using alarms.   Take sips of fluid every 10 minutes and always have a drink in your hand throughout the day.   Be sure to use protein shakes as you figure out your meal and drink timing.

## 2019-06-19 ENCOUNTER — Ambulatory Visit: Payer: BC Managed Care – PPO | Attending: Internal Medicine

## 2019-06-19 DIAGNOSIS — Z20822 Contact with and (suspected) exposure to covid-19: Secondary | ICD-10-CM | POA: Diagnosis not present

## 2019-06-20 LAB — NOVEL CORONAVIRUS, NAA: SARS-CoV-2, NAA: NOT DETECTED

## 2019-06-22 ENCOUNTER — Other Ambulatory Visit: Payer: BC Managed Care – PPO

## 2019-06-23 ENCOUNTER — Telehealth: Payer: Self-pay | Admitting: Skilled Nursing Facility1

## 2019-06-23 NOTE — Telephone Encounter (Signed)
Pt called with a question about vitamins.   Answered to pts satisfaction.

## 2019-06-29 ENCOUNTER — Other Ambulatory Visit: Payer: BC Managed Care – PPO

## 2019-07-13 ENCOUNTER — Encounter: Payer: BC Managed Care – PPO | Attending: Surgery | Admitting: Skilled Nursing Facility1

## 2019-07-13 ENCOUNTER — Other Ambulatory Visit: Payer: Self-pay

## 2019-07-13 ENCOUNTER — Encounter: Payer: Self-pay | Admitting: Skilled Nursing Facility1

## 2019-07-13 DIAGNOSIS — E669 Obesity, unspecified: Secondary | ICD-10-CM | POA: Insufficient documentation

## 2019-07-13 NOTE — Progress Notes (Signed)
Bariatric Nutrition Follow-Up Visit Medical Nutrition Therapy   NUTRITION ASSESSMENT  Pt reports trying Miralax for constipation which improved bowel movements. Pt reports that she experiences nosebleeds but thinks it is related to allergies or sinus infections.  Anthropometrics  Start weight at [facility]: 204 lbs (date: 07/25/2018) Today's weight: 178.1 lbs Weight change: 25.9 lbs (since previous nutrition visit)  Body Composition Scale Date  Weight  lbs 178.1  Total Body Fat  % 37.3     Visceral Fat 10  Fat-Free Mass  % 62.6     Total Body Water  % 45.8     Muscle-Mass  lbs 29.6  BMI 31.2  Body Fat Displacement ---        Torso  lbs 41.1        Left Leg  lbs 8.2        Right Leg  lbs 8.2        Left Arm  lbs 4.1        Right Arm  lbs 4.1   Clinical  Medical hx:  Medications:   Labs:    Lifestyle & Dietary Hx Pt notes energy level is much better and no new stressors. She notes she is chewing thoroughly but may eat too quickly. She reports she works from home and eats meals/snacks away from her desk. She notes she is afraid to try new foods.   Estimated daily fluid intake: 64-70 oz Estimated daily protein intake: 74-80 g Supplements: capsule bariatric advantage 1/d, Celebrate chews 3 /d Current average weekly physical activity: walking daily 30 minutes elliptical 2-3/week  24-Hr Dietary Recall First Meal:  2 egg cheese tukey sausage protein drink Snack: cheese bites Second Meal: grilled chicken w/ cheese Snack: yogurt Third Meal: shrimp Snack:  Beverages: water gatorade zero  Post-Op Goals/ Signs/ Symptoms Using straws: no Drinking while eating: after meals Chewing/swallowing difficulties: no Changes in vision: no Changes to mood/headaches: no Hair loss/changes to skin/nails: yes hair loss since 6 weeks prior Difficulty focusing/concentrating: no Sweating: no Dizziness/lightheadedness: no Palpitations: no Carbonated/caffeinated beverages: no N/V/D/C/Gas:  constipated 3xs every other day, now once a day BM Abdominal pain: no Dumping syndrome: no    NUTRITION DIAGNOSIS  Overweight/obesity ([place]-3.3) related to past poor dietary habits and physical inactivity as evidenced by completed bariatric surgery and following dietary guidelines for continued weight loss and healthy nutrition status.     NUTRITION INTERVENTION Nutrition counseling (C-1) and education (E-2) to facilitate bariatric surgery goals, including: . The importance of consuming adequate calories as well as certain nutrients daily due to the body's need for essential vitamins, minerals, and fats . The importance of daily physical activity and to reach a goal of at least 150 minutes of moderate to vigorous physical activity weekly (or as directed by their physician) due to benefits such as increased musculature and improved lab values . The importance of intuitive eating specifically learning hunger-satiety cues and understanding the importance of learning a new body  Goals: -Continue to aim for a minimum of 64 fluid ounces 7 days a week with at least 30 ounces being plain water  -Eat non-starchy vegetables 2 times a day 7 days a week  -Start out with soft cooked vegetables today and tomorrow; if tolerated begin to eat raw vegetables or cooked including salads  -Eat your 3 ounces of protein first then start in on your non-starchy vegetables; once you understand how much of your meal leads to satisfaction and not full while still eating 3 ounces of  protein and non-starchy vegetables you can eat them in any order   -Continue to aim for 30 minutes of activity at least 5 times a week  -Do NOT cook with/add to your food: alfredo sauce, cheese sauce, barbeque sauce, ketchup, fat back, butter, bacon grease, grease, Crisco   Handouts Provided Include   Phase 4 Food Plan  Learning Style & Readiness for Change Teaching method utilized: Visual & Auditory  Demonstrated degree of  understanding via: Teach Back  Barriers to learning/adherence to lifestyle change: none noted  RD's Notes for Next Visit . Assess adherence to pt chosen goals . Ask if tried new or plant-based forms of protein    MONITORING & EVALUATION Dietary intake, weekly physical activity, and body weight in 2 months.  Next Steps Patient is to follow-up in 2 month

## 2019-07-13 NOTE — Patient Instructions (Signed)
-  Continue to aim for a minimum of 64 fluid ounces 7 days a week with at least 30 ounces being plain water  -Eat non-starchy vegetables 2 times a day 7 days a week  -Start out with soft cooked vegetables today and tomorrow; if tolerated begin to eat raw vegetables or cooked including salads  -Eat your 3 ounces of protein first then start in on your non-starchy vegetables; once you understand how much of your meal leads to satisfaction and not full while still eating 3 ounces of protein and non-starchy vegetables you can eat them in any order   -Continue to aim for 30 minutes of activity at least 5 times a week  -Do NOT cook with/add to your food: alfredo sauce, cheese sauce, barbeque sauce, ketchup, fat back, butter, bacon grease, grease, Crisco

## 2019-08-07 DIAGNOSIS — Z0000 Encounter for general adult medical examination without abnormal findings: Secondary | ICD-10-CM | POA: Diagnosis not present

## 2019-08-07 DIAGNOSIS — Z1389 Encounter for screening for other disorder: Secondary | ICD-10-CM | POA: Diagnosis not present

## 2019-08-07 DIAGNOSIS — Z23 Encounter for immunization: Secondary | ICD-10-CM | POA: Diagnosis not present

## 2019-08-07 DIAGNOSIS — E559 Vitamin D deficiency, unspecified: Secondary | ICD-10-CM | POA: Diagnosis not present

## 2019-09-14 ENCOUNTER — Encounter: Payer: BC Managed Care – PPO | Attending: Surgery | Admitting: Skilled Nursing Facility1

## 2019-09-14 ENCOUNTER — Other Ambulatory Visit: Payer: Self-pay

## 2019-09-14 DIAGNOSIS — E669 Obesity, unspecified: Secondary | ICD-10-CM | POA: Diagnosis not present

## 2019-09-14 NOTE — Progress Notes (Signed)
Bariatric Nutrition Follow-Up Visit Medical Nutrition Therapy   NUTRITION ASSESSMENT  Anthropometrics  Start weight at [facility]: 204 lbs (date: 07/25/2018) Today's weight: 165.8  Body Composition Scale Date 09/14/2019  Weight  lbs 178.1 165.8  Total Body Fat  % 37.3 35     Visceral Fat 10 9  Fat-Free Mass  % 62.6 64.9     Total Body Water  % 45.8 46.9     Muscle-Mass  lbs 29.6 29.4  BMI 31.2 29  Body Fat Displacement ---         Torso  lbs 41.1 35.8        Left Leg  lbs 8.2 7.1        Right Leg  lbs 8.2 7.1        Left Arm  lbs 4.1 3.5        Right Arm  lbs 4.1 3.5   Clinical  Medical hx:  Medications:   Labs:    Lifestyle & Dietary Hx Pt states she has joined BELT which she has really enjoyed. Pt states she has enjoyed the morning star chicken nuggets and tried some other things she liked okay. Pt states she has been craving sweets and thinks maybe it is work stress.   Estimated daily fluid intake: 64-70 oz Estimated daily protein intake: 74-80 g Supplements: capsule bariatric advantage 1/d, Calcium chews 3 /d Current average weekly physical activity: walking daily 30 minutes elliptical 2-3/week; BELT  24-Hr Dietary Recall Protein drink Egg and sausage  Snack: cheese Second Meal: plant based chicken with Galer beans or broccoli or salad  Snack: yogurt Third Meal: shrimp or chicken Snack:  Beverages: water gatorade zero  Post-Op Goals/ Signs/ Symptoms Using straws: no Drinking while eating: after meals Chewing/swallowing difficulties: no Changes in vision: no Changes to mood/headaches: no Hair loss/changes to skin/nails: yes hair loss since 6 weeks prior Difficulty focusing/concentrating: no Sweating: no Dizziness/lightheadedness: no Palpitations: no Carbonated/caffeinated beverages: no N/V/D/C/Gas: constipated 3xs every other day, now once a day BM Abdominal pain: no Dumping syndrome: no    NUTRITION DIAGNOSIS  Overweight/obesity ([place]-3.3) related to past  poor dietary habits and physical inactivity as evidenced by completed bariatric surgery and following dietary guidelines for continued weight loss and healthy nutrition status.     NUTRITION INTERVENTION Nutrition counseling (C-1) and education (E-2) to facilitate bariatric surgery goals, including: . The importance of consuming adequate calories as well as certain nutrients daily due to the body's need for essential vitamins, minerals, and fats . The importance of daily physical activity and to reach a goal of at least 150 minutes of moderate to vigorous physical activity weekly (or as directed by their physician) due to benefits such as increased musculature and improved lab values . The importance of intuitive eating specifically learning hunger-satiety cues and understanding the importance of learning a new body   Learning Style & Readiness for Change Teaching method utilized: Visual & Auditory  Demonstrated degree of understanding via: Teach Back  Barriers to learning/adherence to lifestyle change: none noted  RD's Notes for Next Visit . Assess adherence to pt chosen goals   MONITORING & EVALUATION Dietary intake, weekly physical activity, and body weight in 2 months.  Next Steps Patient is to follow-up in 3 month

## 2019-10-27 ENCOUNTER — Other Ambulatory Visit: Payer: Self-pay

## 2019-10-28 ENCOUNTER — Encounter: Payer: Self-pay | Admitting: Nurse Practitioner

## 2019-10-28 ENCOUNTER — Ambulatory Visit (INDEPENDENT_AMBULATORY_CARE_PROVIDER_SITE_OTHER): Payer: BC Managed Care – PPO | Admitting: Nurse Practitioner

## 2019-10-28 VITALS — BP 118/78 | Ht 63.0 in | Wt 163.0 lb

## 2019-10-28 DIAGNOSIS — Z9889 Other specified postprocedural states: Secondary | ICD-10-CM | POA: Diagnosis not present

## 2019-10-28 DIAGNOSIS — N951 Menopausal and female climacteric states: Secondary | ICD-10-CM

## 2019-10-28 DIAGNOSIS — Z01419 Encounter for gynecological examination (general) (routine) without abnormal findings: Secondary | ICD-10-CM

## 2019-10-28 DIAGNOSIS — Z1151 Encounter for screening for human papillomavirus (HPV): Secondary | ICD-10-CM | POA: Diagnosis not present

## 2019-10-28 NOTE — Addendum Note (Signed)
Addended by: Nelva Nay on: 10/28/2019 09:49 AM   Modules accepted: Orders

## 2019-10-28 NOTE — Progress Notes (Signed)
   Debra Combs 1966-04-25 [id number]   History:  54 y.o. G6 P2 presents for annual exam without GYN complaints.  2007 endometrial ablation, 1997 cryo with normal subsequent paps.  LMP 04/2019, denies menopausal symptoms.  Had gastric sleeve  in December 2020, down 45 pounds.  History of menstrual migraine, these are better since her cycles have stopped.   Gynecologic History No LMP recorded. (Menstrual status: Perimenopausal).   Contraception: vasectomy Last Pap: 10/15/2017. Results were: +ASCUS, negative HR HPV Last mammogram: 04/29/2019. Results were: normal Last colonoscopy: 2019. Results were: benign polyp, 5 year repeat   Past medical history, past surgical history, family history and social history were all reviewed and documented in the EPIC chart. 63 year old son, 75 year old daughter with plans to go to nursing school. Works from home.  ROS:  A ROS was performed and pertinent positives and negatives are included.  Exam:  Vitals:   10/28/19 0901  BP: 118/78  Weight: 163 lb (73.9 kg)  Height: 5\' 3"  (1.6 m)   Body mass index is 28.87 kg/m.  General appearance:  Normal Thyroid:  Symmetrical, normal in size, without palpable masses or nodularity. Respiratory  Auscultation:  Clear without wheezing or rhonchi Cardiovascular  Auscultation:  Regular rate, without rubs, murmurs or gallops  Edema/varicosities:  Not grossly evident Abdominal  Soft,nontender, without masses, guarding or rebound.  Liver/spleen:  No organomegaly noted  Hernia:  None appreciated  Skin  Inspection:  Grossly normal   Breasts: Examined lying and sitting.   Right: Without masses, retractions, discharge or axillary adenopathy.   Left: Without masses, retractions, discharge or axillary adenopathy. Gentitourinary   Inguinal/mons:  Normal without inguinal adenopathy  External genitalia:  Normal  BUS/Urethra/Skene's glands:  Normal  Vagina:  Normal  Cervix:  Normal  Uterus:  Normal in size,  shape and contour.  Midline and mobile  Adnexa/parametria:     Rt: Without masses or tenderness.   Lt: Without masses or tenderness.  Anus and perineum: Normal  Digital rectal exam: Normal sphincter tone without palpated masses or tenderness  Assessment/Plan:  54 y.o. MBF G6P2 for annual exam.   Well female exam with routine gynecological exam - Education provided on SBEs, importance of preventative screenings, current guidelines, high calcium diet, regular exercise, and multivitamin daily.   Perimenopause - LMP 04/2019, denies menopausal symptoms, recommended OTC vaginal lubricants for dryness  History of endometrial ablation 2007  Follow up in 1 year for annual      [place], 9:23 AM 10/28/2019

## 2019-10-28 NOTE — Patient Instructions (Addendum)
Over the counter vaginal lubricants: Replens, Vagisil moisturizer, Feminease, Moist Again, K-Y Liquibeads, Hyalo GYN   Menopause Menopause is the normal time of life when menstrual periods stop completely. It is usually confirmed by 12 months without a menstrual period. The transition to menopause (perimenopause) most often happens between the ages of 11 and 53. During perimenopause, hormone levels change in your body, which can cause symptoms and affect your health. Menopause may increase your risk for:  Loss of bone (osteoporosis), which causes bone breaks (fractures).  Depression.  Hardening and narrowing of the arteries (atherosclerosis), which can cause heart attacks and strokes. What are the causes? This condition is usually caused by a natural change in hormone levels that happens as you get older. The condition may also be caused by surgery to remove both ovaries (bilateral oophorectomy). What increases the risk? This condition is more likely to start at an earlier age if you have certain medical conditions or treatments, including:  A tumor of the pituitary gland in the brain.  A disease that affects the ovaries and hormone production.  Radiation treatment for cancer.  Certain cancer treatments, such as chemotherapy or hormone (anti-estrogen) therapy.  Heavy smoking and excessive alcohol use.  Family history of early menopause. This condition is also more likely to develop earlier in women who are very thin. What are the signs or symptoms? Symptoms of this condition include:  Hot flashes.  Irregular menstrual periods.  Night sweats.  Changes in feelings about sex. This could be a decrease in sex drive or an increased comfort around your sexuality.  Vaginal dryness and thinning of the vaginal walls. This may cause painful intercourse.  Dryness of the skin and development of wrinkles.  Headaches.  Problems sleeping (insomnia).  Mood swings or  irritability.  Memory problems.  Weight gain.  Hair growth on the face and chest.  Bladder infections or problems with urinating. How is this diagnosed? This condition is diagnosed based on your medical history, a physical exam, your age, your menstrual history, and your symptoms. Hormone tests may also be done. How is this treated? In some cases, no treatment is needed. You and your health care provider should make a decision together about whether treatment is necessary. Treatment will be based on your individual condition and preferences. Treatment for this condition focuses on managing symptoms. Treatment may include:  Menopausal hormone therapy (MHT).  Medicines to treat specific symptoms or complications.  Acupuncture.  Vitamin or herbal supplements. Before starting treatment, make sure to let your health care provider know if you have a personal or family history of:  Heart disease.  Breast cancer.  Blood clots.  Diabetes.  Osteoporosis. Follow these instructions at home: Lifestyle  Do not use any products that contain nicotine or tobacco, such as cigarettes and e-cigarettes. If you need help quitting, ask your health care provider.  Get at least 30 minutes of physical activity on 5 or more days each week.  Avoid alcoholic and caffeinated beverages, as well as spicy foods. This may help prevent hot flashes.  Get 7-8 hours of sleep each night.  If you have hot flashes, try: ? Dressing in layers. ? Avoiding things that may trigger hot flashes, such as spicy food, warm places, or stress. ? Taking slow, deep breaths when a hot flash starts. ? Keeping a fan in your home and office.  Find ways to manage stress, such as deep breathing, meditation, or journaling.  Consider going to group therapy with other women  who are having menopause symptoms. Ask your health care provider about recommended group therapy meetings. Eating and drinking  Eat a healthy, balanced  diet that contains whole grains, lean protein, low-fat dairy, and plenty of fruits and vegetables.  Your health care provider may recommend adding more soy to your diet. Foods that contain soy include tofu, tempeh, and soy milk.  Eat plenty of foods that contain calcium and vitamin D for bone health. Items that are rich in calcium include low-fat milk, yogurt, beans, almonds, sardines, broccoli, and kale. Medicines  Take over-the-counter and prescription medicines only as told by your health care provider.  Talk with your health care provider before starting any herbal supplements. If prescribed, take vitamins and supplements as told by your health care provider. These may include: ? Calcium. Women age 53 and older should get 1,200 mg (milligrams) of calcium every day. ? Vitamin D. Women need 600-800 International Units of vitamin D each day. ? Vitamins B12 and B6. Aim for 50 micrograms of B12 and 1.5 mg of B6 each day. General instructions  Keep track of your menstrual periods, including: ? When they occur. ? How heavy they are and how long they last. ? How much time passes between periods.  Keep track of your symptoms, noting when they start, how often you have them, and how long they last.  Use vaginal lubricants or moisturizers to help with vaginal dryness and improve comfort during sex.  Keep all follow-up visits as told by your health care provider. This is important. This includes any group therapy or counseling. Contact a health care provider if:  You are still having menstrual periods after age 53.  You have pain during sex.  You have not had a period for 12 months and you develop vaginal bleeding. Get help right away if:  You have: ? Severe depression. ? Excessive vaginal bleeding. ? Pain when you urinate. ? A fast or irregular heart beat (palpitations). ? Severe headaches. ? Abdomen (abdominal) pain or severe indigestion.  You fell and you think you have a broken  bone.  You develop leg or chest pain.  You develop vision problems.  You feel a lump in your breast. Summary  Menopause is the normal time of life when menstrual periods stop completely. It is usually confirmed by 12 months without a menstrual period.  The transition to menopause (perimenopause) most often happens between the ages of 32 and 5.  Symptoms can be managed through medicines, lifestyle changes, and complementary therapies such as acupuncture.  Eat a balanced diet that is rich in nutrients to promote bone health and heart health and to manage symptoms during menopause. This information is not intended to replace advice given to you by your health care provider. Make sure you discuss any questions you have with your health care provider. Document Revised: 04/26/2017 Document Reviewed: 06/16/2016 Elsevier Patient Education  2020 [place] Maintenance, Female Adopting a healthy lifestyle and getting preventive care are important in promoting health and wellness. Ask your health care provider about:  The right schedule for you to have regular tests and exams.  Things you can do on your own to prevent diseases and keep yourself healthy. What should I know about diet, weight, and exercise? Eat a healthy diet   Eat a diet that includes plenty of vegetables, fruits, low-fat dairy products, and lean protein.  Do not eat a lot of foods that are high in solid fats, added sugars, or sodium. Maintain a  healthy weight Body mass index (BMI) is used to identify weight problems. It estimates body fat based on height and weight. Your health care provider can help determine your BMI and help you achieve or maintain a healthy weight. Get regular exercise Get regular exercise. This is one of the most important things you can do for your health. Most adults should:  Exercise for at least 150 minutes each week. The exercise should increase your heart rate and make you sweat  (moderate-intensity exercise).  Do strengthening exercises at least twice a week. This is in addition to the moderate-intensity exercise.  Spend less time sitting. Even light physical activity can be beneficial. Watch cholesterol and blood lipids Have your blood tested for lipids and cholesterol at 53 years of age, then have this test every 5 years. Have your cholesterol levels checked more often if:  Your lipid or cholesterol levels are high.  You are older than 53 years of age.  You are at high risk for heart disease. What should I know about cancer screening? Depending on your health history and family history, you may need to have cancer screening at various ages. This may include screening for:  Breast cancer.  Cervical cancer.  Colorectal cancer.  Skin cancer.  Lung cancer. What should I know about heart disease, diabetes, and high blood pressure? Blood pressure and heart disease  High blood pressure causes heart disease and increases the risk of stroke. This is more likely to develop in people who have high blood pressure readings, are of African descent, or are overweight.  Have your blood pressure checked: ? Every 3-5 years if you are 41-30 years of age. ? Every year if you are 48 years old or older. Diabetes Have regular diabetes screenings. This checks your fasting blood sugar level. Have the screening done:  Once every three years after age 54 if you are at a normal weight and have a low risk for diabetes.  More often and at a younger age if you are overweight or have a high risk for diabetes. What should I know about preventing infection? Hepatitis B If you have a higher risk for hepatitis B, you should be screened for this virus. Talk with your health care provider to find out if you are at risk for hepatitis B infection. Hepatitis C Testing is recommended for:  Everyone born from [birth year] through 1964.  Anyone with known risk factors for hepatitis  C. Sexually transmitted infections (STIs)  Get screened for STIs, including gonorrhea and chlamydia, if: ? You are sexually active and are younger than 53 years of age. ? You are older than 53 years of age and your health care provider tells you that you are at risk for this type of infection. ? Your sexual activity has changed since you were last screened, and you are at increased risk for chlamydia or gonorrhea. Ask your health care provider if you are at risk.  Ask your health care provider about whether you are at high risk for HIV. Your health care provider may recommend a prescription medicine to help prevent HIV infection. If you choose to take medicine to prevent HIV, you should first get tested for HIV. You should then be tested every 3 months for as long as you are taking the medicine. Pregnancy  If you are about to stop having your period (premenopausal) and you may become pregnant, seek counseling before you get pregnant.  Take 400 to 800 micrograms (mcg) of folic acid every  day if you become pregnant.  Ask for birth control (contraception) if you want to prevent pregnancy. Osteoporosis and menopause Osteoporosis is a disease in which the bones lose minerals and strength with aging. This can result in bone fractures. If you are 22 years old or older, or if you are at risk for osteoporosis and fractures, ask your health care provider if you should:  Be screened for bone loss.  Take a calcium or vitamin D supplement to lower your risk of fractures.  Be given hormone replacement therapy (HRT) to treat symptoms of menopause. Follow these instructions at home: Lifestyle  Do not use any products that contain nicotine or tobacco, such as cigarettes, e-cigarettes, and chewing tobacco. If you need help quitting, ask your health care provider.  Do not use street drugs.  Do not share needles.  Ask your health care provider for help if you need support or information about quitting  drugs. Alcohol use  Do not drink alcohol if: ? Your health care provider tells you not to drink. ? You are pregnant, may be pregnant, or are planning to become pregnant.  If you drink alcohol: ? Limit how much you use to 0-1 drink a day. ? Limit intake if you are breastfeeding.  Be aware of how much alcohol is in your drink. In the [place] one drink equals one 12 oz bottle of beer (355 mL), one 5 oz glass of wine (148 mL), or one 1 oz glass of hard liquor (44 mL). General instructions  Schedule regular health, dental, and eye exams.  Stay current with your vaccines.  Tell your health care provider if: ? You often feel depressed. ? You have ever been abused or do not feel safe at home. Summary  Adopting a healthy lifestyle and getting preventive care are important in promoting health and wellness.  Follow your health care provider's instructions about healthy diet, exercising, and getting tested or screened for diseases.  Follow your health care provider's instructions on monitoring your cholesterol and blood pressure. This information is not intended to replace advice given to you by your health care provider. Make sure you discuss any questions you have with your health care provider. Document Revised: 05/07/2018 Document Reviewed: 05/07/2018 Elsevier Patient Education  2020 [employer].

## 2019-10-30 LAB — PAP, TP IMAGING W/ HPV RNA, RFLX HPV TYPE 16,18/45: HPV DNA High Risk: NOT DETECTED

## 2019-11-18 ENCOUNTER — Ambulatory Visit: Payer: BC Managed Care – PPO | Attending: Internal Medicine

## 2019-11-18 DIAGNOSIS — Z20822 Contact with and (suspected) exposure to covid-19: Secondary | ICD-10-CM

## 2019-11-19 DIAGNOSIS — Z903 Acquired absence of stomach [part of]: Secondary | ICD-10-CM | POA: Diagnosis not present

## 2019-11-19 DIAGNOSIS — G4733 Obstructive sleep apnea (adult) (pediatric): Secondary | ICD-10-CM | POA: Diagnosis not present

## 2019-11-19 LAB — NOVEL CORONAVIRUS, NAA: SARS-CoV-2, NAA: NOT DETECTED

## 2019-11-19 LAB — SARS-COV-2, NAA 2 DAY TAT

## 2019-12-21 ENCOUNTER — Other Ambulatory Visit: Payer: Self-pay

## 2019-12-21 ENCOUNTER — Encounter: Payer: BC Managed Care – PPO | Attending: Surgery | Admitting: Skilled Nursing Facility1

## 2019-12-21 NOTE — Progress Notes (Signed)
Bariatric Nutrition Follow-Up Visit Medical Nutrition Therapy   NUTRITION ASSESSMENT  Anthropometrics  Start weight at [facility]: 204 lbs (date: 07/25/2018) Today's weight: 159.8  Body Composition Scale Date 09/14/2019 12/21/2019  Weight  lbs 178.1 165.8 159.8  Total Body Fat  % 37.3 35 33.9     Visceral Fat 10 9 9   Fat-Free Mass  % 62.6 64.9 66     Total Body Water  % 45.8 46.9 47.5     Muscle-Mass  lbs 29.6 29.4 29.2  BMI 31.2 29 27.9  Body Fat Displacement ---          Torso  lbs 41.1 35.8 33.4        Left Leg  lbs 8.2 7.1 6.6        Right Leg  lbs 8.2 7.1 6.6        Left Arm  lbs 4.1 3.5 3.3        Right Arm  lbs 4.1 3.5 3.3   Clinical  Medical hx:  Medications:   Labs:    Lifestyle & Dietary Hx  Pt states she always needs naps stating getting up for BELT is tough.  Pt states she has had some issue with some items causing indigestion typically beef so she is staying away from it.  Pt states she wants to jump start more weight loss: Dietitian advised pt focus on health goals and fitness goals rather than weight goals to continue to right by her body and practice mindfulness.   Estimated daily fluid intake: 64-70 oz Estimated daily protein intake: 74-80 g Supplements: capsule bariatric advantage 1/d, Calcium chews 3 /d Current average weekly physical activity: walking daily 30 minutes elliptical 2-3/week; BELT  24-Hr Dietary Recall Protein drink Egg and sausage sometimes english muffin Snack: cheese or edamame  Second Meal: plant based chicken with [place] beans or broccoli or salad: chicken  Or ham + tomatoes + cucumber + cheese + eggs Snack: yogurt or almonds Third Meal: shrimp or chicken or pork chop + fruit  Snack: skinny popcorn Beverages: water, gatorade zero  Post-Op Goals/ Signs/ Symptoms Using straws: no Drinking while eating: after meals Chewing/swallowing difficulties: no Changes in vision: no Changes to mood/headaches: no Hair loss/changes to skin/nails:  yes reports shedding  Difficulty focusing/concentrating: no Sweating: no Dizziness/lightheadedness: no Palpitations: no Carbonated/caffeinated beverages: no N/V/D/C/Gas: constipated 3xs every other day, now once a day BM Abdominal pain: no Dumping syndrome: no    NUTRITION DIAGNOSIS  Overweight/obesity ([place]-3.3) related to past poor dietary habits and physical inactivity as evidenced by completed bariatric surgery and following dietary guidelines for continued weight loss and healthy nutrition status.     NUTRITION INTERVENTION Nutrition counseling (C-1) and education (E-2) to facilitate bariatric surgery goals, including: . The importance of consuming adequate calories as well as certain nutrients daily due to the body's need for essential vitamins, minerals, and fats . The importance of daily physical activity and to reach a goal of at least 150 minutes of moderate to vigorous physical activity weekly (or as directed by their physician) due to benefits such as increased musculature and improved lab values . The importance of intuitive eating specifically learning hunger-satiety cues and understanding the importance of learning a new body   Learning Style & Readiness for Change Teaching method utilized: Visual & Auditory  Demonstrated degree of understanding via: Teach Back  Barriers to learning/adherence to lifestyle change: none noted  RD's Notes for Next Visit . Assess adherence to pt chosen goals  MONITORING & EVALUATION Dietary intake, weekly physical activity, and body weight   Next Steps Patient is to follow-up as needed

## 2020-01-05 ENCOUNTER — Ambulatory Visit: Payer: BC Managed Care – PPO

## 2020-01-05 ENCOUNTER — Other Ambulatory Visit: Payer: Self-pay

## 2020-01-05 DIAGNOSIS — Z20822 Contact with and (suspected) exposure to covid-19: Secondary | ICD-10-CM | POA: Diagnosis not present

## 2020-01-06 LAB — NOVEL CORONAVIRUS, NAA: SARS-CoV-2, NAA: NOT DETECTED

## 2020-01-06 LAB — SARS-COV-2, NAA 2 DAY TAT

## 2020-01-13 DIAGNOSIS — D2262 Melanocytic nevi of left upper limb, including shoulder: Secondary | ICD-10-CM | POA: Diagnosis not present

## 2020-01-13 DIAGNOSIS — Z79899 Other long term (current) drug therapy: Secondary | ICD-10-CM | POA: Diagnosis not present

## 2020-01-13 DIAGNOSIS — L700 Acne vulgaris: Secondary | ICD-10-CM | POA: Diagnosis not present

## 2020-01-15 DIAGNOSIS — Z79899 Other long term (current) drug therapy: Secondary | ICD-10-CM | POA: Diagnosis not present

## 2020-01-15 DIAGNOSIS — L700 Acne vulgaris: Secondary | ICD-10-CM | POA: Diagnosis not present

## 2020-01-25 ENCOUNTER — Telehealth: Payer: Self-pay | Admitting: Skilled Nursing Facility1

## 2020-01-25 NOTE — Telephone Encounter (Signed)
Returned pts call.  Answered pts questions to her satisfaction. Questions about multivitamin.

## 2020-02-08 ENCOUNTER — Ambulatory Visit
Admission: RE | Admit: 2020-02-08 | Discharge: 2020-02-08 | Disposition: A | Discharge status: Home / Self Care | Payer: BC Managed Care – PPO | Source: Ambulatory Visit | Attending: Internal Medicine | Admitting: Internal Medicine

## 2020-02-08 ENCOUNTER — Other Ambulatory Visit: Payer: Self-pay | Admitting: Internal Medicine

## 2020-02-08 DIAGNOSIS — R202 Paresthesia of skin: Secondary | ICD-10-CM | POA: Diagnosis not present

## 2020-02-08 DIAGNOSIS — M79605 Pain in left leg: Secondary | ICD-10-CM

## 2020-02-08 DIAGNOSIS — M4186 Other forms of scoliosis, lumbar region: Secondary | ICD-10-CM | POA: Diagnosis not present

## 2020-02-08 DIAGNOSIS — M47816 Spondylosis without myelopathy or radiculopathy, lumbar region: Secondary | ICD-10-CM | POA: Diagnosis not present

## 2020-02-15 ENCOUNTER — Other Ambulatory Visit: Payer: Self-pay

## 2020-02-15 DIAGNOSIS — R202 Paresthesia of skin: Secondary | ICD-10-CM

## 2020-02-16 ENCOUNTER — Other Ambulatory Visit: Payer: Self-pay

## 2020-02-16 ENCOUNTER — Ambulatory Visit (INDEPENDENT_AMBULATORY_CARE_PROVIDER_SITE_OTHER): Payer: BC Managed Care – PPO | Admitting: Neurology

## 2020-02-16 DIAGNOSIS — M5417 Radiculopathy, lumbosacral region: Secondary | ICD-10-CM

## 2020-02-16 DIAGNOSIS — R202 Paresthesia of skin: Secondary | ICD-10-CM | POA: Diagnosis not present

## 2020-02-16 NOTE — Procedures (Signed)
[facility]  [address]  [place], [place] [address] Tel: [phone] Fax:  [phone] Test Date:  02/16/2020  Patient: Debra Combs DOB: 02-24-1966 Physician: Narda Amber, DO  Sex: Female Height: 5\' 3"  Ref Phys: Wenda Low, MD  ID#: [id number] Temp: 33.0C Technician:    Patient Complaints: This is a 54 year old female referred for evaluation of left leg radicular pain and foot numbness.  NCV & EMG Findings: Extensive electrodiagnostic testing of the left lower extremity shows:  1. Left sural and superficial peroneal sensory responses are within normal limits. 2. Left peroneal and tibial motor responses are within normal limits. 3. Left tibial H reflex study is within normal limits. 4. Chronic motor axonal loss changes are seen affecting the left gastrocnemius and biceps femoris short head muscle, without accompanied active denervation.  Impression: 1. Chronic S1 radiculopathy affecting the left lower extremity, mild. 2. There is no evidence of a sensorimotor polyneuropathy.   ___________________________ Narda Amber, DO    Nerve Conduction Studies Anti Sensory Summary Table   Stim Site NR Peak (ms) Norm Peak (ms) P-T Amp (V) Norm P-T Amp  Left Sup Peroneal Anti Sensory (Ant Lat Mall)  33C  12 cm    2.5 <4.6 29.1 >4  Left Sural Anti Sensory (Lat Mall)  33C  Calf    2.9 <4.6 27.3 >4   Motor Summary Table   Stim Site NR Onset (ms) Norm Onset (ms) O-P Amp (mV) Norm O-P Amp Site1 Site2 Delta-0 (ms) Dist (cm) Vel (m/s) Norm Vel (m/s)  Left Peroneal Motor (Ext Dig Brev)  33C  Ankle    3.9 <6.0 2.9 >2.5 B Fib Ankle 6.3 33.0 52 >40  B Fib    10.2  2.9  Poplt B Fib 1.4 8.0 57 >40  Poplt    11.6  2.7         Left Tibial Motor (Abd Hall Brev)  33C  Ankle    3.4 <6.0 9.2 >4 Knee Ankle 7.5 36.0 48 >40  Knee    10.9  7.0          H Reflex Studies   NR H-Lat (ms) Lat Norm (ms) L-R H-Lat (ms)  Left Tibial (Gastroc)  33C     27.76 <35     EMG   Side Muscle Ins Act Fibs Psw Fasc Number Recrt Dur Dur. Amp Amp. Poly Poly. Comment  Left AntTibialis Nml Nml Nml Nml Nml Nml Nml Nml Nml Nml Nml Nml N/A  Left Gastroc Nml Nml Nml Nml 1- Rapid Few 1+ Few 1+ Nml Nml N/A  Left Flex Dig Long Nml Nml Nml Nml Nml Nml Nml Nml Nml Nml Nml Nml N/A  Left BicepsFemS Nml Nml Nml Nml 1- Rapid Few 1+ Few 1+ Nml Nml N/A  Left GluteusMed Nml Nml Nml Nml Nml Nml Nml Nml Nml Nml Nml Nml N/A      Waveforms:

## 2020-02-25 ENCOUNTER — Encounter: Payer: BC Managed Care – PPO | Admitting: Diagnostic Neuroimaging

## 2020-03-07 DIAGNOSIS — M4180 Other forms of scoliosis, site unspecified: Secondary | ICD-10-CM | POA: Diagnosis not present

## 2020-03-07 DIAGNOSIS — M545 Low back pain, unspecified: Secondary | ICD-10-CM | POA: Diagnosis not present

## 2020-03-07 DIAGNOSIS — M5459 Other low back pain: Secondary | ICD-10-CM | POA: Diagnosis not present

## 2020-03-23 DIAGNOSIS — Z23 Encounter for immunization: Secondary | ICD-10-CM | POA: Diagnosis not present

## 2020-03-23 DIAGNOSIS — M5459 Other low back pain: Secondary | ICD-10-CM | POA: Diagnosis not present

## 2020-03-29 DIAGNOSIS — M4180 Other forms of scoliosis, site unspecified: Secondary | ICD-10-CM | POA: Diagnosis not present

## 2020-04-29 DIAGNOSIS — L700 Acne vulgaris: Secondary | ICD-10-CM | POA: Diagnosis not present

## 2020-04-30 DIAGNOSIS — Z1231 Encounter for screening mammogram for malignant neoplasm of breast: Secondary | ICD-10-CM | POA: Diagnosis not present

## 2020-05-29 DIAGNOSIS — Z03818 Encounter for observation for suspected exposure to other biological agents ruled out: Secondary | ICD-10-CM | POA: Diagnosis not present

## 2020-06-03 DIAGNOSIS — K912 Postsurgical malabsorption, not elsewhere classified: Secondary | ICD-10-CM | POA: Diagnosis not present

## 2020-06-11 DIAGNOSIS — Z1152 Encounter for screening for COVID-19: Secondary | ICD-10-CM | POA: Diagnosis not present

## 2020-08-10 DIAGNOSIS — E559 Vitamin D deficiency, unspecified: Secondary | ICD-10-CM | POA: Diagnosis not present

## 2020-08-10 DIAGNOSIS — Z23 Encounter for immunization: Secondary | ICD-10-CM | POA: Diagnosis not present

## 2020-08-10 DIAGNOSIS — Z0000 Encounter for general adult medical examination without abnormal findings: Secondary | ICD-10-CM | POA: Diagnosis not present

## 2020-08-10 DIAGNOSIS — G43909 Migraine, unspecified, not intractable, without status migrainosus: Secondary | ICD-10-CM | POA: Diagnosis not present

## 2020-09-30 DIAGNOSIS — J40 Bronchitis, not specified as acute or chronic: Secondary | ICD-10-CM | POA: Diagnosis not present

## 2020-10-31 ENCOUNTER — Other Ambulatory Visit: Payer: Self-pay

## 2020-10-31 ENCOUNTER — Encounter: Payer: Self-pay | Admitting: Nurse Practitioner

## 2020-10-31 ENCOUNTER — Ambulatory Visit (INDEPENDENT_AMBULATORY_CARE_PROVIDER_SITE_OTHER): Payer: BC Managed Care – PPO | Admitting: Nurse Practitioner

## 2020-10-31 VITALS — BP 122/70 | Ht 62.0 in | Wt 159.0 lb

## 2020-10-31 DIAGNOSIS — N951 Menopausal and female climacteric states: Secondary | ICD-10-CM | POA: Diagnosis not present

## 2020-10-31 DIAGNOSIS — Z01419 Encounter for gynecological examination (general) (routine) without abnormal findings: Secondary | ICD-10-CM

## 2020-10-31 DIAGNOSIS — R232 Flushing: Secondary | ICD-10-CM | POA: Diagnosis not present

## 2020-10-31 NOTE — Progress Notes (Signed)
   Debra Combs 08-25-1965 [id number]   History:  55 y.o. [id number] presents for annual exam. Amenorrheic/2007 ablation. She has started having hot flashes and insomnia. 1997 cryo, subsequent paps normal. Normal mammogram history. Down 55 pounds since gastric sleeve procedure in 2020, exercising daily.   Gynecologic History No LMP recorded. (Menstrual status: Perimenopausal).   Contraception/Family planning: vasectomy  Health Maintenance Last Pap: 10/28/2019. Results were: normal Last mammogram: 04/30/2020. Results were: normal Last colonoscopy: 2019. Results were: benign polyp, 5-year recall Last Dexa: Not indicated  Past medical history, past surgical history, family history and social history were all reviewed and documented in the EPIC chart. Married. 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 46 yo daughter - CNA. 51 yo son - going to [place] in the fall on full academic scholarship, thinking law.   ROS:  A ROS was performed and pertinent positives and negatives are included.  Exam:  Vitals:   10/31/20 0841  BP: 122/70  Weight: 159 lb (72.1 kg)  Height: 5\' 2"  (1.575 m)   Body mass index is 29.08 kg/m.  General appearance:  Normal Thyroid:  Symmetrical, normal in size, without palpable masses or nodularity. Respiratory  Auscultation:  Clear without wheezing or rhonchi Cardiovascular  Auscultation:  Regular rate, without rubs, murmurs or gallops  Edema/varicosities:  Not grossly evident Abdominal  Soft,nontender, without masses, guarding or rebound.  Liver/spleen:  No organomegaly noted  Hernia:  None appreciated  Skin  Inspection:  Grossly normal Breasts: Examined lying and sitting.   Right: Without masses, retractions, nipple discharge or axillary adenopathy.   Left: Without masses, retractions, nipple discharge or axillary adenopathy. Genitourinary   Inguinal/mons:  Normal without inguinal adenopathy  External genitalia:  Normal appearing vulva with no masses, tenderness,  or lesions  BUS/Urethra/Skene's glands:  Normal  Vagina:  Normal appearing with normal color and discharge, no lesions  Cervix:  Normal appearing without discharge or lesions  Uterus:  Normal in size, shape and contour.  Midline and mobile, nontender  Adnexa/parametria:     Rt: Normal in size, without masses or tenderness.   Lt: Normal in size, without masses or tenderness.  Anus and perineum: Normal  Digital rectal exam: Normal sphincter tone without palpated masses or tenderness  Assessment/Plan:  55 y.o. [id number] for annual exam.   Well female exam with routine gynecological exam - Education provided on SBEs, importance of preventative screenings, current guidelines, high calcium diet, regular exercise, and multivitamin daily. Labs with PCP.   Menopausal symptoms - Plan: Follicle stimulating hormone. She has been experiencing hot flashes and insomnia. Amenorrheic from ablation years ago. Provided her with OTC supplements for management of symptoms.   Screening for cervical cancer - 1997 cryp, subsequent paps normal. Will repeat at 5-year interval per guidelines.  Screening for breast cancer - Normal mammogram history.  Continue annual screenings.  Normal breast exam today.  Screening for colon cancer - 2019 colonoscopy. Will repeat at GI's recommended interval.   Return in 1 year for annual.     Tamela Gammon DNP, 8:59 AM 10/31/2020

## 2020-10-31 NOTE — Patient Instructions (Addendum)
Debra Combs, [place], [place]  Health Maintenance, Female Adopting a healthy lifestyle and getting preventive care are important in promoting health and wellness. Ask your health care provider about:  The right schedule for you to have regular tests and exams.  Things you can do on your own to prevent diseases and keep yourself healthy. What should I know about diet, weight, and exercise? Eat a healthy diet  Eat a diet that includes plenty of vegetables, fruits, low-fat dairy products, and lean protein.  Do not eat a lot of foods that are high in solid fats, added sugars, or sodium.   Maintain a healthy weight Body mass index (BMI) is used to identify weight problems. It estimates body fat based on height and weight. Your health care provider can help determine your BMI and help you achieve or maintain a healthy weight. Get regular exercise Get regular exercise. This is one of the most important things you can do for your health. Most adults should:  Exercise for at least 150 minutes each week. The exercise should increase your heart rate and make you sweat (moderate-intensity exercise).  Do strengthening exercises at least twice a week. This is in addition to the moderate-intensity exercise.  Spend less time sitting. Even light physical activity can be beneficial. Watch cholesterol and blood lipids Have your blood tested for lipids and cholesterol at 54 years of age, then have this test every 5 years. Have your cholesterol levels checked more often if:  Your lipid or cholesterol levels are high.  You are older than 54 years of age.  You are at high risk for heart disease. What should I know about cancer screening? Depending on your health history and family history, you may need to have cancer screening at various ages. This may include screening for:  Breast cancer.  Cervical cancer.  Colorectal cancer.  Skin cancer.  Lung cancer. What should I know about  heart disease, diabetes, and high blood pressure? Blood pressure and heart disease  High blood pressure causes heart disease and increases the risk of stroke. This is more likely to develop in people who have high blood pressure readings, are of African descent, or are overweight.  Have your blood pressure checked: ? Every 3-5 years if you are 37-70 years of age. ? Every year if you are 24 years old or older. Diabetes Have regular diabetes screenings. This checks your fasting blood sugar level. Have the screening done:  Once every three years after age 89 if you are at a normal weight and have a low risk for diabetes.  More often and at a younger age if you are overweight or have a high risk for diabetes. What should I know about preventing infection? Hepatitis B If you have a higher risk for hepatitis B, you should be screened for this virus. Talk with your health care provider to find out if you are at risk for hepatitis B infection. Hepatitis C Testing is recommended for:  Everyone born from [birth year] through 1964.  Anyone with known risk factors for hepatitis C. Sexually transmitted infections (STIs)  Get screened for STIs, including gonorrhea and chlamydia, if: ? You are sexually active and are younger than 54 years of age. ? You are older than 54 years of age and your health care provider tells you that you are at risk for this type of infection. ? Your sexual activity has changed since you were last screened, and you are at increased risk for chlamydia  or gonorrhea. Ask your health care provider if you are at risk.  Ask your health care provider about whether you are at high risk for HIV. Your health care provider may recommend a prescription medicine to help prevent HIV infection. If you choose to take medicine to prevent HIV, you should first get tested for HIV. You should then be tested every 3 months for as long as you are taking the medicine. Pregnancy  If you are about to  stop having your period (premenopausal) and you may become pregnant, seek counseling before you get pregnant.  Take 400 to 800 micrograms (mcg) of folic acid every day if you become pregnant.  Ask for birth control (contraception) if you want to prevent pregnancy. Osteoporosis and menopause Osteoporosis is a disease in which the bones lose minerals and strength with aging. This can result in bone fractures. If you are 83 years old or older, or if you are at risk for osteoporosis and fractures, ask your health care provider if you should:  Be screened for bone loss.  Take a calcium or vitamin D supplement to lower your risk of fractures.  Be given hormone replacement therapy (HRT) to treat symptoms of menopause. Follow these instructions at home: Lifestyle  Do not use any products that contain nicotine or tobacco, such as cigarettes, e-cigarettes, and chewing tobacco. If you need help quitting, ask your health care provider.  Do not use street drugs.  Do not share needles.  Ask your health care provider for help if you need support or information about quitting drugs. Alcohol use  Do not drink alcohol if: ? Your health care provider tells you not to drink. ? You are pregnant, may be pregnant, or are planning to become pregnant.  If you drink alcohol: ? Limit how much you use to 0-1 drink a day. ? Limit intake if you are breastfeeding.  Be aware of how much alcohol is in your drink. In the [place] one drink equals one 12 oz bottle of beer (355 mL), one 5 oz glass of wine (148 mL), or one 1 oz glass of hard liquor (44 mL). General instructions  Schedule regular health, dental, and eye exams.  Stay current with your vaccines.  Tell your health care provider if: ? You often feel depressed. ? You have ever been abused or do not feel safe at home. Summary  Adopting a healthy lifestyle and getting preventive care are important in promoting health and wellness.  Follow your  health care provider's instructions about healthy diet, exercising, and getting tested or screened for diseases.  Follow your health care provider's instructions on monitoring your cholesterol and blood pressure. This information is not intended to replace advice given to you by your health care provider. Make sure you discuss any questions you have with your health care provider. Document Revised: 05/07/2018 Document Reviewed: 05/07/2018 Elsevier Patient Education  2021 [employer].

## 2020-11-01 ENCOUNTER — Encounter: Payer: Self-pay | Admitting: Nurse Practitioner

## 2020-11-01 LAB — FOLLICLE STIMULATING HORMONE: FSH: 136.5 m[IU]/mL — ABNORMAL HIGH

## 2020-11-16 DIAGNOSIS — E65 Localized adiposity: Secondary | ICD-10-CM | POA: Diagnosis not present

## 2021-01-11 DIAGNOSIS — E65 Localized adiposity: Secondary | ICD-10-CM | POA: Diagnosis not present

## 2021-01-16 DIAGNOSIS — G4733 Obstructive sleep apnea (adult) (pediatric): Secondary | ICD-10-CM | POA: Diagnosis not present

## 2021-01-20 DIAGNOSIS — G4710 Hypersomnia, unspecified: Secondary | ICD-10-CM | POA: Diagnosis not present

## 2021-01-26 HISTORY — PX: PANNICULECTOMY: SUR1001

## 2021-01-31 DIAGNOSIS — M793 Panniculitis, unspecified: Secondary | ICD-10-CM | POA: Diagnosis not present

## 2021-01-31 DIAGNOSIS — E65 Localized adiposity: Secondary | ICD-10-CM | POA: Diagnosis not present

## 2021-01-31 DIAGNOSIS — Z411 Encounter for cosmetic surgery: Secondary | ICD-10-CM | POA: Diagnosis not present

## 2021-02-15 DIAGNOSIS — L819 Disorder of pigmentation, unspecified: Secondary | ICD-10-CM | POA: Diagnosis not present

## 2021-02-15 DIAGNOSIS — L700 Acne vulgaris: Secondary | ICD-10-CM | POA: Diagnosis not present

## 2021-02-15 DIAGNOSIS — Z79899 Other long term (current) drug therapy: Secondary | ICD-10-CM | POA: Diagnosis not present

## 2021-03-11 DIAGNOSIS — Z23 Encounter for immunization: Secondary | ICD-10-CM | POA: Diagnosis not present

## 2021-04-14 DIAGNOSIS — J4520 Mild intermittent asthma, uncomplicated: Secondary | ICD-10-CM | POA: Diagnosis not present

## 2021-04-14 DIAGNOSIS — J019 Acute sinusitis, unspecified: Secondary | ICD-10-CM | POA: Diagnosis not present

## 2021-04-26 ENCOUNTER — Other Ambulatory Visit: Payer: Self-pay | Admitting: Internal Medicine

## 2021-04-26 DIAGNOSIS — Z1231 Encounter for screening mammogram for malignant neoplasm of breast: Secondary | ICD-10-CM

## 2021-05-04 ENCOUNTER — Other Ambulatory Visit: Payer: Self-pay

## 2021-05-04 ENCOUNTER — Ambulatory Visit
Admission: RE | Admit: 2021-05-04 | Discharge: 2021-05-04 | Disposition: A | Discharge status: Home / Self Care | Payer: BC Managed Care – PPO | Source: Ambulatory Visit | Attending: Internal Medicine | Admitting: Internal Medicine

## 2021-05-04 DIAGNOSIS — Z79899 Other long term (current) drug therapy: Secondary | ICD-10-CM | POA: Diagnosis not present

## 2021-05-04 DIAGNOSIS — Z1231 Encounter for screening mammogram for malignant neoplasm of breast: Secondary | ICD-10-CM | POA: Diagnosis not present

## 2021-05-04 DIAGNOSIS — L700 Acne vulgaris: Secondary | ICD-10-CM | POA: Diagnosis not present

## 2021-06-20 DIAGNOSIS — M25511 Pain in right shoulder: Secondary | ICD-10-CM | POA: Diagnosis not present

## 2021-06-26 DIAGNOSIS — M25511 Pain in right shoulder: Secondary | ICD-10-CM | POA: Diagnosis not present

## 2021-06-26 DIAGNOSIS — M7541 Impingement syndrome of right shoulder: Secondary | ICD-10-CM | POA: Diagnosis not present

## 2021-06-26 DIAGNOSIS — S46011D Strain of muscle(s) and tendon(s) of the rotator cuff of right shoulder, subsequent encounter: Secondary | ICD-10-CM | POA: Diagnosis not present

## 2021-06-26 DIAGNOSIS — M6281 Muscle weakness (generalized): Secondary | ICD-10-CM | POA: Diagnosis not present

## 2021-07-03 DIAGNOSIS — S46011D Strain of muscle(s) and tendon(s) of the rotator cuff of right shoulder, subsequent encounter: Secondary | ICD-10-CM | POA: Diagnosis not present

## 2021-07-03 DIAGNOSIS — M25511 Pain in right shoulder: Secondary | ICD-10-CM | POA: Diagnosis not present

## 2021-07-03 DIAGNOSIS — M6281 Muscle weakness (generalized): Secondary | ICD-10-CM | POA: Diagnosis not present

## 2021-07-03 DIAGNOSIS — M7541 Impingement syndrome of right shoulder: Secondary | ICD-10-CM | POA: Diagnosis not present

## 2021-07-11 DIAGNOSIS — S46011D Strain of muscle(s) and tendon(s) of the rotator cuff of right shoulder, subsequent encounter: Secondary | ICD-10-CM | POA: Diagnosis not present

## 2021-07-11 DIAGNOSIS — M7541 Impingement syndrome of right shoulder: Secondary | ICD-10-CM | POA: Diagnosis not present

## 2021-07-11 DIAGNOSIS — M6281 Muscle weakness (generalized): Secondary | ICD-10-CM | POA: Diagnosis not present

## 2021-07-11 DIAGNOSIS — M25511 Pain in right shoulder: Secondary | ICD-10-CM | POA: Diagnosis not present

## 2021-07-25 DIAGNOSIS — M7581 Other shoulder lesions, right shoulder: Secondary | ICD-10-CM | POA: Diagnosis not present

## 2021-07-31 DIAGNOSIS — M25511 Pain in right shoulder: Secondary | ICD-10-CM | POA: Diagnosis not present

## 2021-07-31 DIAGNOSIS — M6281 Muscle weakness (generalized): Secondary | ICD-10-CM | POA: Diagnosis not present

## 2021-07-31 DIAGNOSIS — S46011D Strain of muscle(s) and tendon(s) of the rotator cuff of right shoulder, subsequent encounter: Secondary | ICD-10-CM | POA: Diagnosis not present

## 2021-07-31 DIAGNOSIS — M7541 Impingement syndrome of right shoulder: Secondary | ICD-10-CM | POA: Diagnosis not present

## 2021-08-16 DIAGNOSIS — Z23 Encounter for immunization: Secondary | ICD-10-CM | POA: Diagnosis not present

## 2021-08-16 DIAGNOSIS — E559 Vitamin D deficiency, unspecified: Secondary | ICD-10-CM | POA: Diagnosis not present

## 2021-08-16 DIAGNOSIS — Z0000 Encounter for general adult medical examination without abnormal findings: Secondary | ICD-10-CM | POA: Diagnosis not present

## 2021-08-31 DIAGNOSIS — D122 Benign neoplasm of ascending colon: Secondary | ICD-10-CM | POA: Diagnosis not present

## 2021-08-31 DIAGNOSIS — Z86010 Personal history of colonic polyps: Secondary | ICD-10-CM | POA: Diagnosis not present

## 2021-09-25 DIAGNOSIS — L905 Scar conditions and fibrosis of skin: Secondary | ICD-10-CM | POA: Diagnosis not present

## 2021-11-01 ENCOUNTER — Ambulatory Visit: Payer: BC Managed Care – PPO | Admitting: Nurse Practitioner

## 2021-11-02 ENCOUNTER — Ambulatory Visit: Payer: BC Managed Care – PPO | Admitting: Nurse Practitioner

## 2021-12-06 NOTE — Progress Notes (Deleted)
   CATALAYA GARR August 20, 1965 [id number]   History:  56 y.o. [id number] presents for annual exam. Postmenopausal - no HRT, no bleeding. 1997 cryo, subsequent paps normal. Normal mammogram history. Down 55 pounds since gastric sleeve procedure in 2020, exercising daily.   Gynecologic History No LMP recorded. (Menstrual status: Perimenopausal).   Contraception/Family planning: vasectomy Sexually active: Yes  Health Maintenance Last Pap: 10/28/2019. Results were: Normal, 5-year repeat Last mammogram: 05/04/2021. Results were: Normal Last colonoscopy: 2019. Results were: benign polyp, 5-year recall Last Dexa: Not indicated  Past medical history, past surgical history, family history and social history were all reviewed and documented in the EPIC chart. Married. 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 23 yo daughter - CNA. 60 yo son - going to [place] in the fall on full academic scholarship, thinking law. Both parents with history of esophageal cancer.   ROS:  A ROS was performed and pertinent positives and negatives are included.  Exam:  There were no vitals filed for this visit.  There is no height or weight on file to calculate BMI.  General appearance:  Normal Thyroid:  Symmetrical, normal in size, without palpable masses or nodularity. Respiratory  Auscultation:  Clear without wheezing or rhonchi Cardiovascular  Auscultation:  Regular rate, without rubs, murmurs or gallops  Edema/varicosities:  Not grossly evident Abdominal  Soft,nontender, without masses, guarding or rebound.  Liver/spleen:  No organomegaly noted  Hernia:  None appreciated  Skin  Inspection:  Grossly normal Breasts: Examined lying and sitting.   Right: Without masses, retractions, nipple discharge or axillary adenopathy.   Left: Without masses, retractions, nipple discharge or axillary adenopathy. Genitourinary   Inguinal/mons:  Normal without inguinal adenopathy  External genitalia:  Normal appearing vulva with no  masses, tenderness, or lesions  BUS/Urethra/Skene's glands:  Normal  Vagina:  Normal appearing with normal color and discharge, no lesions  Cervix:  Normal appearing without discharge or lesions  Uterus:  Normal in size, shape and contour.  Midline and mobile, nontender  Adnexa/parametria:     Rt: Normal in size, without masses or tenderness.   Lt: Normal in size, without masses or tenderness.  Anus and perineum: Normal  Digital rectal exam: Normal sphincter tone without palpated masses or tenderness  Patient informed chaperone available to be present for breast and pelvic exam. Patient has requested no chaperone to be present. Patient has been advised what will be completed during breast and pelvic exam.   Assessment/Plan:  56 y.o. [id number] for annual exam.   Well female exam with routine gynecological exam - Education provided on SBEs, importance of preventative screenings, current guidelines, high calcium diet, regular exercise, and multivitamin daily. Labs with PCP.     Screening for cervical cancer - 1997 cryp, subsequent paps normal. Will repeat at 5-year interval per guidelines.  Screening for breast cancer - Normal mammogram history.  Continue annual screenings.  Normal breast exam today.  Screening for colon cancer - 2019 colonoscopy. Will repeat at GI's recommended interval.   Return in 1 year for annual.     Tamela Gammon DNP, 4:26 PM 12/06/2021

## 2021-12-07 ENCOUNTER — Ambulatory Visit: Payer: BC Managed Care – PPO | Admitting: Nurse Practitioner

## 2021-12-07 DIAGNOSIS — Z01419 Encounter for gynecological examination (general) (routine) without abnormal findings: Secondary | ICD-10-CM

## 2021-12-07 DIAGNOSIS — Z0289 Encounter for other administrative examinations: Secondary | ICD-10-CM

## 2021-12-07 DIAGNOSIS — Z780 Asymptomatic menopausal state: Secondary | ICD-10-CM

## 2021-12-14 ENCOUNTER — Encounter (HOSPITAL_COMMUNITY): Payer: Self-pay | Admitting: *Deleted

## 2021-12-14 DIAGNOSIS — M7062 Trochanteric bursitis, left hip: Secondary | ICD-10-CM | POA: Diagnosis not present

## 2021-12-14 DIAGNOSIS — M7061 Trochanteric bursitis, right hip: Secondary | ICD-10-CM | POA: Diagnosis not present

## 2021-12-14 DIAGNOSIS — M545 Low back pain, unspecified: Secondary | ICD-10-CM | POA: Diagnosis not present

## 2021-12-27 DIAGNOSIS — M7062 Trochanteric bursitis, left hip: Secondary | ICD-10-CM | POA: Diagnosis not present

## 2022-01-05 DIAGNOSIS — L905 Scar conditions and fibrosis of skin: Secondary | ICD-10-CM | POA: Diagnosis not present

## 2022-01-05 DIAGNOSIS — E65 Localized adiposity: Secondary | ICD-10-CM | POA: Diagnosis not present

## 2022-01-22 DIAGNOSIS — M7061 Trochanteric bursitis, right hip: Secondary | ICD-10-CM | POA: Diagnosis not present

## 2022-01-30 ENCOUNTER — Ambulatory Visit: Payer: Self-pay | Admitting: Nurse Practitioner

## 2022-02-05 ENCOUNTER — Ambulatory Visit (INDEPENDENT_AMBULATORY_CARE_PROVIDER_SITE_OTHER): Payer: BC Managed Care – PPO | Admitting: Nurse Practitioner

## 2022-02-05 ENCOUNTER — Encounter: Payer: Self-pay | Admitting: Nurse Practitioner

## 2022-02-05 VITALS — BP 122/82 | HR 76 | Ht 62.5 in | Wt 157.0 lb

## 2022-02-05 DIAGNOSIS — Z01419 Encounter for gynecological examination (general) (routine) without abnormal findings: Secondary | ICD-10-CM

## 2022-02-05 DIAGNOSIS — Z780 Asymptomatic menopausal state: Secondary | ICD-10-CM | POA: Diagnosis not present

## 2022-02-05 NOTE — Progress Notes (Signed)
   LUCAS EXLINE 10-22-1965 [id number]   History:  56 y.o. [id number] presents for annual exam. Postmenopausal - no HRT, no bleeding. 1997 cryo, subsequent paps normal.  Gynecologic History Patient's last menstrual period was 05/19/2019 (exact date).   Contraception/Family planning: vasectomy Sexually active: Yes  Health Maintenance Last Pap: 10/28/2019. Results were: Normal neg HPV Last mammogram: 05/04/2021. Results were: Normal Last colonoscopy: 08/2021, 5-year recall Last Dexa: Not indicated  Past medical history, past surgical history, family history and social history were all reviewed and documented in the EPIC chart. Married. New job with [employer]. 12 yo daughter - CNA, has 9 mo daughter. 23 yo son - at [place].   ROS:  A ROS was performed and pertinent positives and negatives are included.  Exam:  Vitals:   02/05/22 1338  BP: 122/82  Pulse: 76  SpO2: 97%  Weight: 157 lb (71.2 kg)  Height: 5' 2.5" (1.588 m)    Body mass index is 28.26 kg/m.  General appearance:  Normal Thyroid:  Symmetrical, normal in size, without palpable masses or nodularity. Respiratory  Auscultation:  Clear without wheezing or rhonchi Cardiovascular  Auscultation:  Regular rate, without rubs, murmurs or gallops  Edema/varicosities:  Not grossly evident Abdominal  Soft,nontender, without masses, guarding or rebound.  Liver/spleen:  No organomegaly noted  Hernia:  None appreciated  Skin  Inspection:  Grossly normal Breasts: Examined lying and sitting.   Right: Without masses, retractions, nipple discharge or axillary adenopathy.   Left: Without masses, retractions, nipple discharge or axillary adenopathy. Genitourinary   Inguinal/mons:  Normal without inguinal adenopathy  External genitalia:  Normal appearing vulva with no masses, tenderness, or lesions  BUS/Urethra/Skene's glands:  Normal  Vagina:  Normal appearing with normal color and discharge, no lesions  Cervix:  Normal  appearing without discharge or lesions  Uterus:  Normal in size, shape and contour.  Midline and mobile, nontender  Adnexa/parametria:     Rt: Normal in size, without masses or tenderness.   Lt: Normal in size, without masses or tenderness.  Anus and perineum: Normal  Digital rectal exam: Normal sphincter tone without palpated masses or tenderness  Patient informed chaperone available to be present for breast and pelvic exam. Patient has requested no chaperone to be present. Patient has been advised what will be completed during breast and pelvic exam.   Assessment/Plan:  56 y.o. [id number] for annual exam.   Well female exam with routine gynecological exam - Education provided on SBEs, importance of preventative screenings, current guidelines, high calcium diet, regular exercise, and multivitamin daily. Labs with PCP.   Postmenopausal - no HRT, no bleeding. Mild menopausal symptoms, they have improved.  Screening for cervical cancer - 1997 cryo, subsequent paps normal. Will repeat at 5-year interval per guidelines.  Screening for breast cancer - Normal mammogram history.  Continue annual screenings.  Normal breast exam today.  Screening for colon cancer - 08/2021 colonoscopy. Will repeat at GI's recommended interval.   Screening for osteoporosis - Average risk. Will plan DXA at age 86.   Return in 1 year for annual.     [place], 1:52 PM 02/05/2022

## 2022-02-19 DIAGNOSIS — D171 Benign lipomatous neoplasm of skin and subcutaneous tissue of trunk: Secondary | ICD-10-CM | POA: Diagnosis not present

## 2022-02-19 DIAGNOSIS — Z79899 Other long term (current) drug therapy: Secondary | ICD-10-CM | POA: Diagnosis not present

## 2022-02-19 DIAGNOSIS — L700 Acne vulgaris: Secondary | ICD-10-CM | POA: Diagnosis not present

## 2022-02-23 DIAGNOSIS — Z23 Encounter for immunization: Secondary | ICD-10-CM | POA: Diagnosis not present

## 2022-02-24 DIAGNOSIS — Z23 Encounter for immunization: Secondary | ICD-10-CM | POA: Diagnosis not present

## 2022-03-12 DIAGNOSIS — M5416 Radiculopathy, lumbar region: Secondary | ICD-10-CM | POA: Diagnosis not present

## 2022-03-12 DIAGNOSIS — M7061 Trochanteric bursitis, right hip: Secondary | ICD-10-CM | POA: Diagnosis not present

## 2022-03-29 DIAGNOSIS — M545 Low back pain, unspecified: Secondary | ICD-10-CM | POA: Diagnosis not present

## 2022-03-30 DIAGNOSIS — M5416 Radiculopathy, lumbar region: Secondary | ICD-10-CM | POA: Diagnosis not present

## 2022-04-16 DIAGNOSIS — M5416 Radiculopathy, lumbar region: Secondary | ICD-10-CM | POA: Diagnosis not present

## 2022-06-12 DIAGNOSIS — M5416 Radiculopathy, lumbar region: Secondary | ICD-10-CM | POA: Diagnosis not present

## 2022-06-27 DIAGNOSIS — M48062 Spinal stenosis, lumbar region with neurogenic claudication: Secondary | ICD-10-CM | POA: Diagnosis not present

## 2022-06-27 DIAGNOSIS — M5432 Sciatica, left side: Secondary | ICD-10-CM | POA: Diagnosis not present

## 2022-06-27 DIAGNOSIS — M4125 Other idiopathic scoliosis, thoracolumbar region: Secondary | ICD-10-CM | POA: Diagnosis not present

## 2022-07-03 ENCOUNTER — Other Ambulatory Visit: Payer: Self-pay | Admitting: Internal Medicine

## 2022-07-03 DIAGNOSIS — Z1231 Encounter for screening mammogram for malignant neoplasm of breast: Secondary | ICD-10-CM

## 2022-07-25 DIAGNOSIS — M4125 Other idiopathic scoliosis, thoracolumbar region: Secondary | ICD-10-CM | POA: Diagnosis not present

## 2022-07-25 DIAGNOSIS — Z6829 Body mass index (BMI) 29.0-29.9, adult: Secondary | ICD-10-CM | POA: Diagnosis not present

## 2022-07-25 DIAGNOSIS — M5116 Intervertebral disc disorders with radiculopathy, lumbar region: Secondary | ICD-10-CM | POA: Diagnosis not present

## 2022-07-25 DIAGNOSIS — M461 Sacroiliitis, not elsewhere classified: Secondary | ICD-10-CM | POA: Diagnosis not present

## 2022-08-10 DIAGNOSIS — M461 Sacroiliitis, not elsewhere classified: Secondary | ICD-10-CM | POA: Diagnosis not present

## 2022-08-15 ENCOUNTER — Ambulatory Visit
Admission: RE | Admit: 2022-08-15 | Discharge: 2022-08-15 | Disposition: A | Discharge status: Home / Self Care | Payer: BC Managed Care – PPO | Source: Ambulatory Visit | Attending: Internal Medicine | Admitting: Internal Medicine

## 2022-08-15 DIAGNOSIS — Z1231 Encounter for screening mammogram for malignant neoplasm of breast: Secondary | ICD-10-CM

## 2022-08-20 DIAGNOSIS — E559 Vitamin D deficiency, unspecified: Secondary | ICD-10-CM | POA: Diagnosis not present

## 2022-08-20 DIAGNOSIS — Z0000 Encounter for general adult medical examination without abnormal findings: Secondary | ICD-10-CM | POA: Diagnosis not present

## 2022-09-12 DIAGNOSIS — Z6829 Body mass index (BMI) 29.0-29.9, adult: Secondary | ICD-10-CM | POA: Diagnosis not present

## 2022-09-12 DIAGNOSIS — M25559 Pain in unspecified hip: Secondary | ICD-10-CM | POA: Diagnosis not present

## 2022-10-12 DIAGNOSIS — J4521 Mild intermittent asthma with (acute) exacerbation: Secondary | ICD-10-CM | POA: Diagnosis not present

## 2022-10-12 DIAGNOSIS — J4520 Mild intermittent asthma, uncomplicated: Secondary | ICD-10-CM | POA: Diagnosis not present

## 2022-10-30 ENCOUNTER — Ambulatory Visit: Payer: BC Managed Care – PPO | Admitting: Radiology

## 2022-10-30 ENCOUNTER — Encounter: Payer: Self-pay | Admitting: Radiology

## 2022-10-30 VITALS — BP 130/76

## 2022-10-30 DIAGNOSIS — R102 Pelvic and perineal pain: Secondary | ICD-10-CM

## 2022-10-30 LAB — URINALYSIS, COMPLETE W/RFL CULTURE
Bacteria, UA: NONE SEEN /HPF
Bilirubin Urine: NEGATIVE
Glucose, UA: NEGATIVE
Hgb urine dipstick: NEGATIVE
Hyaline Cast: NONE SEEN /LPF
Leukocyte Esterase: NEGATIVE
Nitrites, Initial: NEGATIVE
Protein, ur: NEGATIVE
RBC / HPF: NONE SEEN /HPF (ref 0–2)
Specific Gravity, Urine: 1.025 (ref 1.001–1.035)
WBC, UA: NONE SEEN /HPF (ref 0–5)
pH: 6.5 (ref 5.0–8.0)

## 2022-10-30 LAB — NO CULTURE INDICATED

## 2022-10-30 NOTE — Progress Notes (Signed)
   Debra Combs Apr 05, 1966 [id number]   History: Postmenopausal 57 y.o. presents with c/o 1 episode of sudden onset of severe pelvic pain 2 weeks ago (doubled over in store). Hx uterine fibroids. Pain intermittently x 6 month diffuse over lower pelvis. Denies any bowel or bladder changes. Hx of gastric sleeve.   Gynecologic History Postmenopausal Last Pap: 2021. Results were: normal Last colonoscopy: 4/23 5 year recall HRT use: no  Obstetric History OB History  Gravida Para Term Preterm AB Living  5 0 0   2 2  SAB IAB Ectopic Multiple Live Births  1   0   0    # Outcome Date GA Lbr Len/2nd Weight Sex Delivery Anes PTL Lv  5 Gravida           4 Gravida           3 Gravida           2 AB           1 SAB              The following portions of the patient's history were reviewed and updated as appropriate: allergies, current medications, past family history, past medical history, past social history, past surgical history, and problem list.  Review of Systems Pertinent items noted in HPI and remainder of comprehensive ROS otherwise negative.  Past medical history, past surgical history, family history and social history were all reviewed and documented in the EPIC chart.  Exam:  Vitals:   10/30/22 1607  BP: 130/76   There is no height or weight on file to calculate BMI.  General appearance:  Normal  Abdominal  Soft,nontender, without masses, guarding or rebound.  Liver/spleen:  No organomegaly noted  Hernia:  None appreciated Genitourinary   Inguinal/mons:  Normal without inguinal adenopathy  External genitalia:  Normal appearing vulva with no masses, tenderness, or lesions  BUS/Urethra/Skene's glands:  Normal  Vagina:  Normal appearing with normal color and discharge, no lesions. Atrophy: mild   Cervix:  Normal appearing without discharge or lesions  Uterus:  Normal in size, shape and contour.  Midline and mobile, nontender  Adnexa/parametria:     Rt: Normal in  size, without masses or tenderness.   Lt: Normal in size, without masses or tenderness.  Anus and perineum: Normal    Raynelle Fanning, CMA present for exam  Assessment/Plan:   1. Pelvic pain - Urinalysis,Complete w/RFL Culture - US Transvaginal Non-OB; Future - warning signs reviewed  Arlie Solomons B WHNP-BC, 4:19 PM 10/30/2022

## 2022-11-22 DIAGNOSIS — R42 Dizziness and giddiness: Secondary | ICD-10-CM | POA: Diagnosis not present

## 2022-11-22 DIAGNOSIS — G4733 Obstructive sleep apnea (adult) (pediatric): Secondary | ICD-10-CM | POA: Diagnosis not present

## 2022-11-22 DIAGNOSIS — E669 Obesity, unspecified: Secondary | ICD-10-CM | POA: Diagnosis not present

## 2022-11-22 DIAGNOSIS — R5383 Other fatigue: Secondary | ICD-10-CM | POA: Diagnosis not present

## 2022-12-03 ENCOUNTER — Institutional Professional Consult (permissible substitution) (INDEPENDENT_AMBULATORY_CARE_PROVIDER_SITE_OTHER): Payer: BC Managed Care – PPO | Admitting: Otolaryngology

## 2022-12-03 NOTE — Progress Notes (Deleted)
Erroneous encounter

## 2022-12-04 DIAGNOSIS — G4733 Obstructive sleep apnea (adult) (pediatric): Secondary | ICD-10-CM | POA: Diagnosis not present

## 2022-12-04 DIAGNOSIS — E668 Other obesity: Secondary | ICD-10-CM | POA: Diagnosis not present

## 2022-12-18 ENCOUNTER — Other Ambulatory Visit: Payer: BC Managed Care – PPO | Admitting: Radiology

## 2022-12-18 ENCOUNTER — Other Ambulatory Visit: Payer: BC Managed Care – PPO

## 2022-12-20 ENCOUNTER — Encounter (HOSPITAL_COMMUNITY): Payer: Self-pay | Admitting: *Deleted

## 2022-12-24 DIAGNOSIS — G4733 Obstructive sleep apnea (adult) (pediatric): Secondary | ICD-10-CM | POA: Diagnosis not present

## 2022-12-24 DIAGNOSIS — E668 Other obesity: Secondary | ICD-10-CM | POA: Diagnosis not present

## 2023-01-01 DIAGNOSIS — E569 Vitamin deficiency, unspecified: Secondary | ICD-10-CM | POA: Diagnosis not present

## 2023-01-01 DIAGNOSIS — R5383 Other fatigue: Secondary | ICD-10-CM | POA: Diagnosis not present

## 2023-01-01 DIAGNOSIS — Z1321 Encounter for screening for nutritional disorder: Secondary | ICD-10-CM | POA: Diagnosis not present

## 2023-01-01 DIAGNOSIS — Z903 Acquired absence of stomach [part of]: Secondary | ICD-10-CM | POA: Diagnosis not present

## 2023-01-02 DIAGNOSIS — E569 Vitamin deficiency, unspecified: Secondary | ICD-10-CM | POA: Diagnosis not present

## 2023-01-02 DIAGNOSIS — Z903 Acquired absence of stomach [part of]: Secondary | ICD-10-CM | POA: Diagnosis not present

## 2023-01-02 DIAGNOSIS — Z1321 Encounter for screening for nutritional disorder: Secondary | ICD-10-CM | POA: Diagnosis not present

## 2023-01-02 DIAGNOSIS — R5383 Other fatigue: Secondary | ICD-10-CM | POA: Diagnosis not present

## 2023-01-23 ENCOUNTER — Institutional Professional Consult (permissible substitution) (INDEPENDENT_AMBULATORY_CARE_PROVIDER_SITE_OTHER): Payer: BC Managed Care – PPO | Admitting: Otolaryngology

## 2023-02-05 DIAGNOSIS — G4733 Obstructive sleep apnea (adult) (pediatric): Secondary | ICD-10-CM | POA: Diagnosis not present

## 2023-02-07 ENCOUNTER — Encounter: Payer: Self-pay | Admitting: Nurse Practitioner

## 2023-02-07 ENCOUNTER — Ambulatory Visit (INDEPENDENT_AMBULATORY_CARE_PROVIDER_SITE_OTHER): Payer: BC Managed Care – PPO | Admitting: Nurse Practitioner

## 2023-02-07 VITALS — BP 116/68 | HR 72 | Ht 62.0 in | Wt 169.0 lb

## 2023-02-07 DIAGNOSIS — N951 Menopausal and female climacteric states: Secondary | ICD-10-CM | POA: Diagnosis not present

## 2023-02-07 DIAGNOSIS — Z780 Asymptomatic menopausal state: Secondary | ICD-10-CM

## 2023-02-07 DIAGNOSIS — Z01419 Encounter for gynecological examination (general) (routine) without abnormal findings: Secondary | ICD-10-CM

## 2023-02-07 MED ORDER — ESTRADIOL 0.1 MG/GM VA CREA: 1.0000 g | TOPICAL_CREAM | VAGINAL | 2 refills | Status: DC

## 2023-02-07 NOTE — Progress Notes (Signed)
   SHU LACOCK 15-Jan-1966 [id number]   History:  57 y.o. [id number] presents for annual exam. Postmenopausal - no HRT, no bleeding. Complains of vaginal dryness and painful intercourse. 1997 cryo, subsequent paps normal. 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  Gynecologic History Patient's last menstrual period was 05/19/2019 (exact date).   Contraception/Family planning: vasectomy Sexually active: Yes  Health Maintenance Last Pap: 10/28/2019. Results were: Normal neg HPV, 5-year repeat Last mammogram: 08/15/2022. Results were: Normal Last colonoscopy: 08/2021, 5-year recall Last Dexa: Not indicated  Past medical history, past surgical history, family history and social history were all reviewed and documented in the EPIC chart. Married. Works for [employer]. 37 yo daughter - CNA, has 1 yo daughter. 82 yo son - at [place].   ROS:  A ROS was performed and pertinent positives and negatives are included.  Exam:  Vitals:   02/07/23 0823  BP: 116/68  Pulse: 72  SpO2: 100%  Weight: 169 lb (76.7 kg)  Height: 5\' 2"  (1.575 m)     Body mass index is 30.91 kg/m.  General appearance:  Normal Thyroid:  Symmetrical, normal in size, without palpable masses or nodularity. Respiratory  Auscultation:  Clear without wheezing or rhonchi Cardiovascular  Auscultation:  Regular rate, without rubs, murmurs or gallops  Edema/varicosities:  Not grossly evident Abdominal  Soft,nontender, without masses, guarding or rebound.  Liver/spleen:  No organomegaly noted  Hernia:  None appreciated  Skin  Inspection:  Grossly normal Breasts: Examined lying and sitting.   Right: Without masses, retractions, nipple discharge or axillary adenopathy.   Left: Without masses, retractions, nipple discharge or axillary adenopathy. Genitourinary   Inguinal/mons:  Normal without inguinal adenopathy  External genitalia:  Normal appearing vulva with no masses, tenderness, or lesions  BUS/Urethra/Skene's glands:   Normal  Vagina:  Normal appearing with normal color and discharge, no lesions  Cervix:  Normal appearing without discharge or lesions  Uterus:  Normal in size, shape and contour.  Midline and mobile, nontender  Adnexa/parametria:     Rt: Normal in size, without masses or tenderness.   Lt: Normal in size, without masses or tenderness.  Anus and perineum: Normal  Digital rectal exam: Not indicated  Patient informed chaperone available to be present for breast and pelvic exam. Patient has requested no chaperone to be present. Patient has been advised what will be completed during breast and pelvic exam.   Assessment/Plan:  57 y.o. [id number] for annual exam.   Well female exam with routine gynecological exam - Education provided on SBEs, importance of preventative screenings, current guidelines, high calcium diet, regular exercise, and multivitamin daily. Labs with PCP.   Postmenopausal - no HRT, no bleeding. Mild menopausal symptoms.  Menopausal vaginal dryness - Plan: estradiol (ESTRACE VAGINAL) 0.1 MG/GM vaginal cream twice weekly. Educated on proper use. Use nightly x 1 week, then twice weekly. Also recommend coconut oil or silicone lubricant during intercourse.  Screening for cervical cancer - 1997 cryo, subsequent paps normal. Will repeat at 5-year interval per guidelines.  Screening for breast cancer - Normal mammogram history.  Continue annual screenings.  Normal breast exam today.  Screening for colon cancer - 08/2021 colonoscopy. Will repeat at GI's recommended interval.   Screening for osteoporosis - Average risk. Will plan DXA at age 75.   Return in 1 year for annual.     Olivia Mackie DNP, 8:49 AM 02/07/2023

## 2023-02-09 DIAGNOSIS — Z23 Encounter for immunization: Secondary | ICD-10-CM | POA: Diagnosis not present

## 2023-02-22 DIAGNOSIS — L700 Acne vulgaris: Secondary | ICD-10-CM | POA: Diagnosis not present

## 2023-02-22 DIAGNOSIS — D2262 Melanocytic nevi of left upper limb, including shoulder: Secondary | ICD-10-CM | POA: Diagnosis not present

## 2023-02-27 ENCOUNTER — Ambulatory Visit (INDEPENDENT_AMBULATORY_CARE_PROVIDER_SITE_OTHER): Payer: BC Managed Care – PPO | Admitting: Otolaryngology

## 2023-02-27 ENCOUNTER — Encounter (INDEPENDENT_AMBULATORY_CARE_PROVIDER_SITE_OTHER): Payer: Self-pay | Admitting: Otolaryngology

## 2023-02-27 VITALS — BP 129/76 | HR 85 | Ht 62.0 in | Wt 170.0 lb

## 2023-02-27 DIAGNOSIS — G43809 Other migraine, not intractable, without status migrainosus: Secondary | ICD-10-CM

## 2023-02-27 DIAGNOSIS — J329 Chronic sinusitis, unspecified: Secondary | ICD-10-CM | POA: Diagnosis not present

## 2023-02-27 DIAGNOSIS — R519 Headache, unspecified: Secondary | ICD-10-CM | POA: Diagnosis not present

## 2023-02-27 DIAGNOSIS — J3089 Other allergic rhinitis: Secondary | ICD-10-CM | POA: Diagnosis not present

## 2023-02-27 DIAGNOSIS — R0981 Nasal congestion: Secondary | ICD-10-CM | POA: Diagnosis not present

## 2023-02-27 DIAGNOSIS — J342 Deviated nasal septum: Secondary | ICD-10-CM

## 2023-02-27 DIAGNOSIS — J343 Hypertrophy of nasal turbinates: Secondary | ICD-10-CM

## 2023-02-27 DIAGNOSIS — J302 Other seasonal allergic rhinitis: Secondary | ICD-10-CM

## 2023-02-27 MED ORDER — FLUTICASONE PROPIONATE 50 MCG/ACT NA SUSP: 2.0000 | Freq: Every day | NASAL | 6 refills | Status: DC

## 2023-02-27 MED ORDER — OXYMETAZOLINE HCL 0.05 % NA SOLN: 1.0000 | Freq: Two times a day (BID) | NASAL | 0 refills | Status: DC

## 2023-02-27 MED ORDER — DESLORATADINE 5 MG PO TABS: 5.0000 mg | ORAL_TABLET | Freq: Every day | ORAL | 3 refills | Status: DC

## 2023-02-27 NOTE — Progress Notes (Unsigned)
error 

## 2023-02-27 NOTE — Patient Instructions (Addendum)
-   start Flonase twice daily - switch your allergy pill to Clarinex - try Sudafed for headache - do Afrin x 2 days then stop when you have a headache  - schedule CT sinuses and return after testing

## 2023-02-28 NOTE — Progress Notes (Signed)
ENT CONSULT:  Reason for Consult: Chronic headache and facial pain and pressure  HPI: Debra Combs is an 57 y.o. female with hx of GERD, hx gastric sleeve for weight loss in 2020, currently on Wegovy, hx of OSA, migraine headaches, previously on Topamax but did not tolerate side effects and currently treats with Tylenol when she gets headaches, here for evaluation of right-sided headache and facial pain pressure x 1 year in the setting of seasonal allergies that she currently treats with Xyzal and montelukast.  Of note the initial referral was placed for dizziness and giddiness, but the patient denies any symptoms at this time, she had dizziness/vertigo but it self resolved without any interventions.  She reports 1 year of right-sided headache that gets worse when she lies on the right side, associated with facial pain and pressure, and can be moderate to severe, typically improves when she rolls over onto her back.  Takes Tylenol for it.  She also reports chronic nasal congestion, with similar presentation where he gets worse when she lies on the right side and improves when she rolls over and lies on her back.  Not on nasal sprays uses Xyzal and montelukast daily   Records Reviewed:  [facility]  Office Visit 01/02/23 with [facility] Debra Combs is a 56 y.o. female who is status post laparoscopic sleeve gastrectomy and suture repair of hiatal hernia on 05/19/2019 by [name]. Preoperative weight was 208 pounds. She was last seen in January 2022 which time she weighed 158 pounds. At that time, she had a goal weight of 150 pounds. She was walking 45 minutes daily at the [facility] and also twice a day with her husband for about 20 to 30 minutes. She saw Dr. [name] at [facility] in 2023 and underwent panniculectomy.   The patient currently weighs 174 pounds and states she started to regain weight about 1 year ago. She states she started having back pain due to arthritis that radiates  into her hips and legs so she is only exercising about twice a week. She changed jobs and has experienced increased stress. Since she does not work from home anyone, she has been eating out more since she is not able to meal prep. She denies any issues with eating and drinking but states she does have a bowel movement about 30 minutes after most meals, especially if they are higher in fat. She still has her gallbladder and does not know if she has gallstones. She states she had a sleep study about 2 weeks ago and was diagnosed with sleep apnea again. She has ordered a CPAP machine. She is taking her multivitamin but admits to not taking her calcium regularly.  She states she is also interested in having a lipoma removed on her right lower back which has been present for several years but has grown recently. She states there is painful at times.  Past Surgical History:  Procedure Laterality Date  GASTRIC SLEEVE 04/2019  PANNICULECTOMY 01/2021  DILATION AND CURETTAGE, DIAGNOSTIC / THERAPEUTIC  2004, 2018     Past Medical History:  Diagnosis Date   Acne    Anxiety    Asthma    Breast cyst    left breast   Cervical dysplasia 1997   Dizziness    GERD (gastroesophageal reflux disease)    HA (headache)    Post-concussion headache 07/21/2014    Past Surgical History:  Procedure Laterality Date   ANKLE SURGERY  1983   Left  BREAST CYST EXCISION  06/08/2011   left breast   COLONOSCOPY     DILATION AND CURETTAGE OF UTERUS     ENDOMETRIAL ABLATION  2008   Her Option   GYNECOLOGIC CRYOSURGERY  1997   LAPAROSCOPIC GASTRIC SLEEVE RESECTION N/A 05/19/2019   Procedure: LAPAROSCOPIC GASTRIC SLEEVE RESECTION, SUTURE REPAIR OF HIATAL HERNIA, Upper Endo, ERAS Pathway;  Surgeon: Ovidio Kin, MD;  Location: [facility];  Service: General;  Laterality: N/A;   MOUTH SURGERY     wisdom teeth   PANNICULECTOMY  01/2021    Family History  Problem Relation Age of Onset   Diabetes Mother    Hypertension  Mother    Esophageal cancer Mother    Cancer Mother        esopphageal   Kidney failure Mother    Esophageal cancer Father    Cancer Father        esophageal   Diabetes Sister    Kidney failure Maternal Aunt    Diabetes Maternal Aunt    Kidney failure Maternal Uncle    Diabetes Maternal Uncle    Hypertension Maternal Grandmother    Diabetes Maternal Grandmother    Leukemia Maternal Grandmother    Breast cancer Neg Hx     Social History:  reports that she has never smoked. She has never used smokeless tobacco. She reports that she does not drink alcohol and does not use drugs.  Allergies:  Allergies  Allergen Reactions   Avelox [Moxifloxacin Hcl In Nacl] Nausea Only    Severe stomach ache   Benzonatate Other (See Comments)    Dizziness, tingling of lips, fingers   Chlorhexidine Gluconate     Minor redness and itching   Nitrofurantoin Monohyd Macro    Sulfa Antibiotics    Zithromax [Azithromycin Dihydrate] Nausea And Vomiting    Medications: I have reviewed the patient's current medications.  The PMH, PSH, Medications, Allergies, and SH were reviewed and updated.  ROS: Constitutional: Negative for fever, weight loss and weight gain. Cardiovascular: Negative for chest pain and dyspnea on exertion. Respiratory: Is not experiencing shortness of breath at rest. Gastrointestinal: Negative for nausea and vomiting. Neurological: Negative for headaches. Psychiatric: The patient is not nervous/anxious  Blood pressure 129/76, pulse 85, height 5\' 2"  (1.575 m), weight 170 lb (77.1 kg), last menstrual period 05/19/2019, SpO2 97%.  PHYSICAL EXAM:  Exam: General: Well-developed, well-nourished Communication and Voice: Clear pitch and clarity Respiratory Respiratory effort: Equal inspiration and expiration without stridor Cardiovascular Peripheral Vascular: Warm extremities with equal color/perfusion Eyes: No nystagmus with equal extraocular motion  bilaterally Neuro/Psych/Balance: Patient oriented to person, place, and time; Appropriate mood and affect; Gait is intact with no imbalance; Cranial nerves I-XII are intact Head and Face Inspection: Normocephalic and atraumatic without mass or lesion Palpation: Facial skeleton intact without bony stepoffs Salivary Glands: No mass or tenderness Facial Strength: Facial motility symmetric and full bilaterally ENT Pinna: External ear intact and fully developed External canal: Canal is patent with intact skin Tympanic Membrane: Clear and mobile External Nose: No scar or anatomic deformity Internal Nose: Septum is with Right sided deviation, left sided septal spur. No polyp, or purulence. Mucosal edema and erythema present.  Bilateral inferior turbinate hypertrophy.  Lips, Teeth, and gums: Mucosa and teeth intact and viable TMJ: No pain to palpation with full mobility Oral cavity/oropharynx: No erythema or exudate, no lesions present Nasopharynx: No mass or lesion with intact mucosa Neck Neck and Trachea: Midline trachea without mass or lesion Thyroid: No mass  or nodularity Lymphatics: No lymphadenopathy  Procedure: PROCEDURE NOTE: nasal endoscopy  Preoperative diagnosis: chronic sinusitis symptoms  Postoperative diagnosis: same  Procedure: Diagnostic nasal endoscopy (16109)  Surgeon: Ashok Croon, M.D.  Anesthesia: Topical lidocaine and Afrin  H&P REVIEW: The patient's history and physical were reviewed today prior to procedure. All medications were reviewed and updated as well. Complications: None Condition is stable throughout exam Indications and consent: The patient presents with symptoms of chronic sinusitis not responding to previous therapies. All the risks, benefits, and potential complications were reviewed with the patient preoperatively and informed consent was obtained. The time out was completed with confirmation of the correct procedure.   Procedure: The patient  was seated upright in the clinic. Topical lidocaine and Afrin were applied to the nasal cavity. After adequate anesthesia had occurred, the rigid nasal endoscope was passed into the nasal cavity. The nasal mucosa, turbinates, septum, and sinus drainage pathways were visualized bilaterally. This revealed no purulence or significant secretions that might be cultured. There were no polyps or sites of significant inflammation. The mucosa was intact and there was no crusting present. The scope was then slowly withdrawn and the patient tolerated the procedure well. There were no complications or blood loss.   Studies Reviewed: CT head done 01/06/2019 CLINICAL DATA:  Headache and nausea.  History of seizures   EXAM: CT HEAD WITHOUT CONTRAST   TECHNIQUE: Contiguous axial images were obtained from the base of the skull through the vertex without intravenous contrast.   COMPARISON:  February 27, 2018   FINDINGS: Brain: Ventricles are normal in size and configuration. There is no intracranial mass, hemorrhage, extra-axial fluid collection, or midline shift. Brain parenchyma appears unremarkable. No acute infarct evident.   Vascular: No hyperdense vessel. There is no appreciable vascular calcification.   Skull: The bony calvarium appears intact.   Sinuses/Orbits: Paranasal sinuses are clear. Orbits appear symmetric bilaterally.   Other: Mastoid air cells are clear.   IMPRESSION: Study within normal limits.  Assessment/Plan: Encounter Diagnoses  Name Primary?   Chronic sinusitis, unspecified location Yes   Facial pain    Nonintractable headache, unspecified chronicity pattern, unspecified headache type    Other migraine without status migrainosus, not intractable    Nasal congestion    Environmental and seasonal allergies    Nasal septal deviation    Hypertrophy of both inferior nasal turbinates     57 year old female with known history of migraine headaches and also documented  history of post-concussion headaches in the chart here for 1 year of chronic right sided headache associated with nasal congestion and facial pain pressure, in the setting of known environmental seasonal allergies which she treats with Xyzal and montelukast currently.  DDx includes migraine HA vs chronic sinus inflammation causing facial pain and pressure vs nasal congestion/allergies/aerosinusitis.    Right facial headache -continue to treat with Tylenol and consider NSAIDs when symptoms become significant or do not respond to Tylenol -Will refer to neurology after adequate workup for chronic sinusitis and allergies have been completed - CT sinuses to rule out chronic sinus disease -CT head I personally reviewed which only imaged portion of her paranasal sinuses that was done in 2020 did not have any chronic sinus inflammation -Try over-the-counter Sudafed when the headaches are severe -Consider Afrin x 48 hrs 2 puffs b/l nares when the headache and nasal congestion are severe 2.  Nasal congestion/environmental allergies  -Stop the Xyzal and start Clarinex 5 mg daily -Start Flonase 2 puffs bilateral nares twice daily -  Continue montelukast 10 mg daily 3.  Nasal obstruction and septal deviation inferior turbinate hypertrophy -Evidence of narrowing of left more than right nasal passages due to inferior turban hypertrophy mucosal edema and septal deviation on nasal endoscopy today -Medical management as above -If indicated and symptoms not improved following maximal medical management will consider Septo/ITR in the future  Thank you for allowing me to participate in the care of this patient. Please do not hesitate to contact me with any questions or concerns.   Ashok Croon, MD Otolaryngology [facility] Phone: [phone] Fax: [phone]    02/28/2023, 6:03 AM

## 2023-03-01 ENCOUNTER — Telehealth: Payer: Self-pay

## 2023-03-01 NOTE — Telephone Encounter (Signed)
Called [employer] multiple times trying to find patient. Spoke with, [place], [place] D. And Shantee A. Shantee advised me that there is NO PA required, but would have to call [employer] to verify. If CPT code 82956 need PA, Spoke with Cindie Crumbly [phone] at [facility], she called [employer] and the patient had not been added to their system.  At this time, Reed Breech is having to manually add patient in their system.  Diane C with [employer] [phone] said she would call back once it has been completed.  Waiting on a return call.

## 2023-03-04 ENCOUNTER — Ambulatory Visit (HOSPITAL_COMMUNITY)
Admission: RE | Admit: 2023-03-04 | Discharge: 2023-03-04 | Disposition: A | Discharge status: Home / Self Care | Payer: BC Managed Care – PPO | Source: Ambulatory Visit | Attending: Otolaryngology | Admitting: Otolaryngology

## 2023-03-04 DIAGNOSIS — J329 Chronic sinusitis, unspecified: Secondary | ICD-10-CM | POA: Diagnosis not present

## 2023-03-04 DIAGNOSIS — R519 Headache, unspecified: Secondary | ICD-10-CM | POA: Diagnosis not present

## 2023-03-04 DIAGNOSIS — Z903 Acquired absence of stomach [part of]: Secondary | ICD-10-CM | POA: Diagnosis not present

## 2023-03-04 DIAGNOSIS — D171 Benign lipomatous neoplasm of skin and subcutaneous tissue of trunk: Secondary | ICD-10-CM | POA: Diagnosis not present

## 2023-03-05 DIAGNOSIS — M25562 Pain in left knee: Secondary | ICD-10-CM | POA: Diagnosis not present

## 2023-03-05 DIAGNOSIS — M1711 Unilateral primary osteoarthritis, right knee: Secondary | ICD-10-CM | POA: Diagnosis not present

## 2023-03-26 DIAGNOSIS — G4733 Obstructive sleep apnea (adult) (pediatric): Secondary | ICD-10-CM | POA: Diagnosis not present

## 2023-03-29 ENCOUNTER — Other Ambulatory Visit: Payer: Self-pay | Admitting: General Surgery

## 2023-03-29 DIAGNOSIS — D171 Benign lipomatous neoplasm of skin and subcutaneous tissue of trunk: Secondary | ICD-10-CM | POA: Diagnosis not present

## 2023-04-01 LAB — SURGICAL PATHOLOGY

## 2023-04-29 ENCOUNTER — Ambulatory Visit (INDEPENDENT_AMBULATORY_CARE_PROVIDER_SITE_OTHER): Payer: BC Managed Care – PPO | Admitting: Otolaryngology

## 2023-04-29 ENCOUNTER — Encounter (INDEPENDENT_AMBULATORY_CARE_PROVIDER_SITE_OTHER): Payer: Self-pay | Admitting: Otolaryngology

## 2023-04-29 VITALS — BP 122/78 | HR 89

## 2023-04-29 DIAGNOSIS — R0981 Nasal congestion: Secondary | ICD-10-CM

## 2023-04-29 DIAGNOSIS — R519 Headache, unspecified: Secondary | ICD-10-CM

## 2023-04-29 DIAGNOSIS — J342 Deviated nasal septum: Secondary | ICD-10-CM | POA: Diagnosis not present

## 2023-04-29 DIAGNOSIS — J343 Hypertrophy of nasal turbinates: Secondary | ICD-10-CM

## 2023-04-29 DIAGNOSIS — G43809 Other migraine, not intractable, without status migrainosus: Secondary | ICD-10-CM

## 2023-04-29 DIAGNOSIS — J3089 Other allergic rhinitis: Secondary | ICD-10-CM | POA: Diagnosis not present

## 2023-04-29 DIAGNOSIS — R040 Epistaxis: Secondary | ICD-10-CM

## 2023-04-29 NOTE — Patient Instructions (Signed)
-   try Ibuprofen for headache  - continue Flonase and Clarinex  - continue Monteleukast - schedule Allergy consultation  - return in 3 months

## 2023-04-29 NOTE — Progress Notes (Signed)
ENT CONSULT:  Update 04/29/23:  Discussed the use of AI scribe software for clinical note transcription with the patient, who gave verbal consent to proceed.  History of Present Illness   The patient with hx of chronic nasal congestion headaches and facial pain/pressure, presents for f/u after CT sinuses. They report that the headaches have decreased in frequency, but the sensation of pressure persists. The patient notes some improvement in their ability to sleep on their side without immediate onset of facial pressure. However, they continue to experience nasal stuffiness, particularly in the left nostril, which has been accompanied by blood tinged nasal secretions when she blows her nose. The patient attributes this to a cold a few weeks prior, or to the current dry and cold weather conditions.  The patient is currently on Clarinex, Flonase, and Singulair. Despite this regimen, they continue to experience symptoms, albeit with some small improvements. They have a history of environmental allergies, confirmed by previous testing.  The patient has a history of headaches, for which they have seen a neurologist in the past. They were previously prescribed topiramate, but discontinued it due to side effects of forgetfulness that impacted their work. They currently manage their headaches with extra strength Tylenol.  The patient also uses a CPAP machine for sleep apnea, which they report difficulty tolerating due to a sensation of suffocation and difficulty breathing.       Initial consultation 02/27/23 Reason for Consult: Chronic headache and facial pain and pressure  HPI: Debra Combs is an 57 y.o. female with hx of GERD, hx gastric sleeve for weight loss in 2020, currently on Wegovy, hx of OSA, migraine headaches, previously on Topamax but did not tolerate side effects and currently treats with Tylenol when she gets headaches, here for evaluation of right-sided headache and facial pain pressure  x 1 year in the setting of seasonal allergies that she currently treats with Xyzal and montelukast.  Of note the initial referral was placed for dizziness and giddiness, but the patient denies any symptoms at this time, she had dizziness/vertigo but it self resolved without any interventions.  She reports 1 year of right-sided headache that gets worse when she lies on the right side, associated with facial pain and pressure, and can be moderate to severe, typically improves when she rolls over onto her back.  Takes Tylenol for it.  She also reports chronic nasal congestion, with similar presentation where he gets worse when she lies on the right side and improves when she rolls over and lies on her back.  Not on nasal sprays uses Xyzal and montelukast daily   Records Reviewed:  [facility]  Office Visit 01/02/23 with [facility] Debra Combs is a 57 y.o. female who is status post laparoscopic sleeve gastrectomy and suture repair of hiatal hernia on 05/19/2019 by [name]. Preoperative weight was 208 pounds. She was last seen in January 2022 which time she weighed 158 pounds. At that time, she had a goal weight of 150 pounds. She was walking 45 minutes daily at the [facility] and also twice a day with her husband for about 20 to 30 minutes. She saw Dr. [name] at [facility] in 2023 and underwent panniculectomy.   The patient currently weighs 174 pounds and states she started to regain weight about 1 year ago. 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 She changed jobs and has  experienced increased stress. Since she does not work from home anyone, she has been eating out more since she is not able to meal prep. She denies any issues with eating and drinking but states she does have a bowel movement about 30 minutes after most meals, especially if they are higher in fat. She still has her gallbladder and  does not know if she has gallstones. She states she had a sleep study about 2 weeks ago and was diagnosed with sleep apnea again. She has ordered a CPAP machine. She is taking her multivitamin but admits to not taking her calcium regularly.  She states she is also interested in having a lipoma removed on her right lower back which has been present for several years but has grown recently. She states there is painful at times.  Past Surgical History:  Procedure Laterality Date  GASTRIC SLEEVE 04/2019  PANNICULECTOMY 01/2021  DILATION AND CURETTAGE, DIAGNOSTIC / THERAPEUTIC  2004, 2018     Past Medical History:  Diagnosis Date   Acne    Anxiety    Asthma    Breast cyst    left breast   Cervical dysplasia 1997   Dizziness    GERD (gastroesophageal reflux disease)    HA (headache)    Post-concussion headache 07/21/2014    Past Surgical History:  Procedure Laterality Date   ANKLE SURGERY  1983   Left   BREAST CYST EXCISION  06/08/2011   left breast   COLONOSCOPY     DILATION AND CURETTAGE OF UTERUS     ENDOMETRIAL ABLATION  2008   Her Option   GYNECOLOGIC CRYOSURGERY  1997   LAPAROSCOPIC GASTRIC SLEEVE RESECTION N/A 05/19/2019   Procedure: LAPAROSCOPIC GASTRIC SLEEVE RESECTION, SUTURE REPAIR OF HIATAL HERNIA, Upper Endo, ERAS Pathway;  Surgeon: Ovidio Kin, MD;  Location: [facility];  Service: General;  Laterality: N/A;   MOUTH SURGERY     wisdom teeth   PANNICULECTOMY  01/2021    Family History  Problem Relation Age of Onset   Diabetes Mother    Hypertension Mother    Esophageal cancer Mother    Cancer Mother        esopphageal   Kidney failure Mother    Esophageal cancer Father    Cancer Father        esophageal   Diabetes Sister    Kidney failure Maternal Aunt    Diabetes Maternal Aunt    Kidney failure Maternal Uncle    Diabetes Maternal Uncle    Hypertension Maternal Grandmother    Diabetes Maternal Grandmother    Leukemia Maternal Grandmother    Breast  cancer Neg Hx     Social History:  reports that she has never smoked. She has never used smokeless tobacco. She reports that she does not drink alcohol and does not use drugs.  Allergies:  Allergies  Allergen Reactions   Avelox [Moxifloxacin Hcl In Nacl] Nausea Only    Severe stomach ache   Benzonatate Other (See Comments)    Dizziness, tingling of lips, fingers   Chlorhexidine Gluconate     Minor redness and itching   Nitrofurantoin Monohyd Macro    Sulfa Antibiotics    Zithromax [Azithromycin Dihydrate] Nausea And Vomiting    Medications: I have reviewed the patient's current medications.  The PMH, PSH, Medications, Allergies, and SH were reviewed and updated.  ROS: Constitutional: Negative for fever, weight loss and weight gain. Cardiovascular: Negative for chest pain and dyspnea on exertion. Respiratory: Is not  experiencing shortness of breath at rest. Gastrointestinal: Negative for nausea and vomiting. Neurological: Negative for headaches. Psychiatric: The patient is not nervous/anxious  Blood pressure 122/78, pulse 89, last menstrual period 05/19/2019, SpO2 97%.  PHYSICAL EXAM:  Exam: General: Well-developed, well-nourished Respiratory Respiratory effort: Equal inspiration and expiration without stridor Cardiovascular Peripheral Vascular: Warm extremities with equal color/perfusion Eyes: No nystagmus with equal extraocular motion bilaterally Neuro/Psych/Balance: Patient oriented to person, place, and time; Appropriate mood and affect; Gait is intact with no imbalance; Cranial nerves I-XII are intact Head and Face Inspection: Normocephalic and atraumatic without mass or lesion Palpation: Facial skeleton intact without bony stepoffs Salivary Glands: No mass or tenderness Facial Strength: Facial motility symmetric and full bilaterally ENT Pinna: External ear intact and fully developed External canal: Canal is patent with intact skin Tympanic Membrane: Clear and  mobile External Nose: No scar or anatomic deformity Internal Nose: Septum is with Right sided deviation, left sided septal spur. No polyp, or purulence. Mucosal edema and erythema present.  Bilateral inferior turbinate hypertrophy on anterior rhinoscopy.  Lips, Teeth, and gums: Mucosa and teeth intact and viable TMJ: No pain to palpation with full mobility Oral cavity/oropharynx: No erythema or exudate, no lesions present Neck Neck and Trachea: Midline trachea without mass or lesion  Studies Reviewed: CT head done 01/06/2019 CLINICAL DATA:  Headache and nausea.  History of seizures   EXAM: CT HEAD WITHOUT CONTRAST   TECHNIQUE: Contiguous axial images were obtained from the base of the skull through the vertex without intravenous contrast.   COMPARISON:  February 27, 2018   FINDINGS: Brain: Ventricles are normal in size and configuration. There is no intracranial mass, hemorrhage, extra-axial fluid collection, or midline shift. Brain parenchyma appears unremarkable. No acute infarct evident.   Vascular: No hyperdense vessel. There is no appreciable vascular calcification.   Skull: The bony calvarium appears intact.   Sinuses/Orbits: Paranasal sinuses are clear. Orbits appear symmetric bilaterally.   Other: Mastoid air cells are clear.   IMPRESSION: Study within normal limits.  CT sinuses 03/04/23 FINDINGS: Osseous: No regional bone abnormality.   Orbits: No orbital abnormality on either side.   Sinuses: Frontal, ethmoid, maxillary and sphenoid sinuses are clear. No acute or chronic inflammatory changes. No significant unfavorable anatomic variants.   Soft tissues: Normal   Limited intracranial: Normal   IMPRESSION: Normal CT scan of the face. No evidence of sinusitis. No significant unfavorable anatomic variants.  Assessment/Plan: Encounter Diagnoses  Name Primary?   Chronic nasal congestion Yes   Other migraine without status migrainosus, not intractable     Nonintractable headache, unspecified chronicity pattern, unspecified headache type    Hypertrophy of both inferior nasal turbinates    Nasal septal deviation    Environmental and seasonal allergies      57 year old female with known history of migraine headaches and also documented history of post-concussion headaches in the chart here for 1 year of chronic right sided headache associated with nasal congestion and facial pain pressure, in the setting of known environmental seasonal allergies which she treats with Xyzal and montelukast currently.  DDx includes migraine HA vs chronic sinus inflammation causing facial pain and pressure vs nasal congestion/allergies/aerosinusitis.    Right facial headache -continue to treat with Tylenol and consider NSAIDs when symptoms become significant or do not respond to Tylenol -Will refer to neurology after adequate workup for chronic sinusitis and allergies have been completed - CT sinuses to rule out chronic sinus disease -CT head I personally reviewed which only imaged  portion of her paranasal sinuses that was done in 2020 did not have any chronic sinus inflammation -Try over-the-counter Sudafed when the headaches are severe -Consider Afrin x 48 hrs 2 puffs b/l nares when the headache and nasal congestion are severe 2.  Nasal congestion/environmental allergies  -Stop the Xyzal and start Clarinex 5 mg daily -Start Flonase 2 puffs bilateral nares twice daily -Continue montelukast 10 mg daily 3.  Nasal obstruction and septal deviation inferior turbinate hypertrophy -Evidence of narrowing of left more than right nasal passages due to inferior turban hypertrophy mucosal edema and septal deviation on nasal endoscopy today -Medical management as above -If indicated and symptoms not improved following maximal medical management will consider Septo/ITR in the future  Update 04/29/23: Assessment and Plan    Chronic Nasal Congestion with Septal Deviation and  Inferior turbinate hypertrophy on CT and exam. Persistent nasal congestion with no chronic sinus disease on CT sinuses. Symptoms include facial pressure and nasal stuffiness, particularly in the left nostril. Currently on Clarinex, Flonase, and montelukast. Sx are likely related to environmental allergies and  - Continue Clarinex, Flonase, and montelukast - Consider Sudafed or Afrin for acute symptom relief - [facility] - Follow up in a few months to reassess need for septoplasty and turbinate reduction  Environmental allergies, reported hx of (+) allergy testing many years ago - medical management of sx as above - referral to Allergy for evaluation possible repeat allergy testing   Septoplasty and Inferior Turbinate Hypertrophy Discussed potential for septoplasty and turbinate reduction if medical management fails. Explained septoplasty and turbinate reduction are outpatient procedures with a typical recovery time of a few days, with nasal congestion as the primary postoperative symptom. Emphasized excluding other causes of sx such as environmental allergies which likely contributes to her sx - continue medical management of allergies  - will consider surgical management in the future if medical management fails to improve sx   Epistaxis Intermittent blood tinged nasal secretions likely secondary to nasal dryness with cold weather. Symptoms include bleeding from the left nostril when blowing the nose. - Use humidifier to alleviate dryness - Vaseline and nasal saline spray   Headache Intermittent headaches with facial pressure, not related to chronic sinus disease (CT sinuses negative for CRS). Previous use of topiramate discontinued due to side effects. Current management includes extra strength Tylenol. Discussed potential benefits of NSAIDs for headache management. Explained NSAIDs, such as ibuprofen, are more effective for tension-type and  migraine headaches due to their anti-inflammatory properties. - Recommend ibuprofen 400-600 mg every 6 hours as needed for headache - Consider referral to neurology for further headache management if symptoms persist  General Health Maintenance Discussed importance of allergy testing and potential allergy shots. Reviewed current medications and their refills. - Check medication refills for Clarinex and Flonase - Send prescriptions to preferred pharmacy if needed  Follow-up - Schedule follow-up appointment in a few months.         Ashok Croon, MD Otolaryngology [facility] Phone: [phone] Fax: [phone]    04/29/2023, 6:12 PM

## 2023-06-06 ENCOUNTER — Ambulatory Visit: Payer: BC Managed Care – PPO | Admitting: Allergy & Immunology

## 2023-06-06 ENCOUNTER — Other Ambulatory Visit: Payer: Self-pay

## 2023-06-06 ENCOUNTER — Encounter: Payer: Self-pay | Admitting: Allergy & Immunology

## 2023-06-06 VITALS — BP 110/60 | HR 95 | Temp 98.2°F | Resp 16 | Ht 62.0 in | Wt 166.2 lb

## 2023-06-06 DIAGNOSIS — J4540 Moderate persistent asthma, uncomplicated: Secondary | ICD-10-CM

## 2023-06-06 DIAGNOSIS — R519 Headache, unspecified: Secondary | ICD-10-CM

## 2023-06-06 DIAGNOSIS — J310 Chronic rhinitis: Secondary | ICD-10-CM

## 2023-06-06 DIAGNOSIS — E739 Lactose intolerance, unspecified: Secondary | ICD-10-CM | POA: Diagnosis not present

## 2023-06-06 MED ORDER — BUDESONIDE-FORMOTEROL FUMARATE 160-4.5 MCG/ACT IN AERO: 2.0000 | INHALATION_SPRAY | Freq: Two times a day (BID) | RESPIRATORY_TRACT | 1 refills | Status: DC

## 2023-06-06 MED ORDER — ALBUTEROL SULFATE HFA 108 (90 BASE) MCG/ACT IN AERS: 1.0000 | INHALATION_SPRAY | Freq: Four times a day (QID) | RESPIRATORY_TRACT | 2 refills | Status: DC | PRN

## 2023-06-06 NOTE — Progress Notes (Signed)
 NEW PATIENT  Date of Service/Encounter:  06/06/23  Consult requested by: Ransom Other, MD   Assessment:   Headaches   Chronic rhinitis  Moderate persistent asthma without complication  Obstructive sleep apnea - managed by Dr. Jess at 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 Headaches and chronic rhinitis - Because of insurance stipulations, we cannot do skin testing on the same day as your first visit. - We are all working to fight this, but for now we need to do two separate visits.  - We will know more after we do testing at the next visit.  - The skin testing visit can be squeezed in at your convenience.  - Then we can make a more full plan to address all of your symptoms. - Be sure to stop your antihistamines for 3 days before this appointment.   2. Moderate persistent asthma, uncomplicated - Lung testing looks great today.  - We are going to change you from [facility] to Symbicort . - This appears to be cheaper from what I can see. - Symbicort  is two puffs twice daily. - Spacer sample and demonstration provided. - Daily controller medication(s): Symbicort  160/4.28mcg two puffs twice daily with spacer - Prior to physical activity: albuterol  2 puffs 10-15 minutes before physical activity. - Rescue medications: albuterol  4 puffs every 4-6 hours as needed - Asthma control goals:  * Full participation in all desired activities (may need albuterol  before activity) * Albuterol  use two time or less a week on average (not counting use with activity) * Cough interfering with sleep two time or less a month * Oral steroids no more than once a year * No hospitalizations  3. Come back in one week at 4:30 pm for testing.    This note in its entirety was forwarded to the Provider who requested this consultation.  Subjective:    Debra Combs is a 58 y.o. female presenting today for evaluation of  Chief Complaint  Patient presents with   Establish Care    States when she lays down or turns head to the right she gets a pressure headache.   Nasal Congestion   Sinus Problem    Debra Combs has a history of the following: Patient Active Problem List   Diagnosis Date Noted   Morbid obesity (HCC) 05/19/2019   Obesity 07/25/2018   Menstrual migraine without status migrainosus, not intractable 11/18/2014   Postconcussion syndrome 11/18/2014   Post-concussion headache 07/21/2014   Dizziness    HA (headache)    Status post cryoablation 06/25/2014   Nonhealing surgical wound 08/28/2012   Painful cutaneous scar 05/06/2012    History obtained from: chart review and patient.  Discussed the use of AI scribe software for clinical note transcription with the patient and/or guardian, who gave verbal consent to proceed.  Debra Combs was referred by Ransom Other, MD.     Debra Combs is a 58 y.o. female presenting for an evaluation of allergies and asthma .  She works as a production designer, theatre/television/film at [employer].   [name] presents with a year-long history of headaches, described as migraines, localized on the right side of the head. The headaches are triggered by certain head movements, such as turning to the right or moving the head quickly. The patient describes the sensation as a pressure, likened to fluid movement,  which is immediately followed by a headache. The discomfort is localized in the right temporal region. The patient has attempted to manage the discomfort by adjusting sleeping positions, now preferring to sleep sitting up. Despite these adjustments, the headaches persist. The patient sought care from an ENT specialist (Dr. Okey), who performed a CT scan and diagnosed a deviated septum.  Per the note, the patient was prescribed Afrin, Flonase , and Clarinex , but reports no significant improvement in  symptoms.   Asthma/Respiratory Symptom History: The patient has a history of asthma, managed with Breo Ellipta and albuterol  as needed. The patient has been on Breo for several years now, since she last saw all the other allergy  practice, and reports satisfaction with its efficacy. The patient also has sleep apnea and uses a CPAP machine, which has been causing some congestion issues. She has been $75 for refill of the Breo.  She is open to a more affordable medication.  Allergic Rhinitis Symptom History: The patient has a history of allergies, managed with Zyrtec and Xyzal , and underwent allergy  testing a few years ago. The patient reports no food allergies but has experienced itching with coconut consumption. The patient has two dogs, which have been in the home for a long time.  She has been tested in the past.  Food Allergy  Symptom History: The patient's medical history also includes a diagnosis of lactose intolerance. The patient avoids dairy products and uses lactose-free alternatives. She does not note any other food allergens.   Otherwise, there is no history of other atopic diseases, including drug allergies, stinging insect allergies, or contact dermatitis. There is no significant infectious history. Vaccinations are up to date.    Past Medical History: Patient Active Problem List   Diagnosis Date Noted   Morbid obesity (HCC) 05/19/2019   Obesity 07/25/2018   Menstrual migraine without status migrainosus, not intractable 11/18/2014   Postconcussion syndrome 11/18/2014   Post-concussion headache 07/21/2014   Dizziness    HA (headache)    Status post cryoablation 06/25/2014   Nonhealing surgical wound 08/28/2012   Painful cutaneous scar 05/06/2012    Medication List:  Allergies as of 06/06/2023       Reactions   Avelox [moxifloxacin Hcl In Nacl] Nausea Only   Severe stomach ache   Benzonatate Other (See Comments)   Dizziness, tingling of lips, fingers   Chlorhexidine  Gluconate     Minor redness and itching   Nitrofurantoin Monohyd Macro    Sulfa Antibiotics    Zithromax [azithromycin Dihydrate] Nausea And Vomiting        Medication List        Accurate as of June 06, 2023 11:07 AM. If you have any questions, ask your nurse or doctor.          STOP taking these medications    Vitamin D 125 MCG (5000 UT) Caps Stopped by: Marty Morton Shaggy       TAKE these medications    acetaminophen  500 MG tablet Commonly known as: TYLENOL  Take 1,000 mg by mouth every 6 (six) hours as needed for moderate pain.   albuterol  108 (90 Base) MCG/ACT inhaler Commonly known as: VENTOLIN  HFA Inhale 1-2 puffs into the lungs every 6 (six) hours as needed for wheezing or shortness of breath.   BARIATRIC MULTIVITAMINS/IRON PO Take by mouth.   Biotin 5000 MCG Tabs Take 5,000 mcg by mouth daily.   budesonide -formoterol  160-4.5 MCG/ACT inhaler Commonly known as: Symbicort  Inhale 2 puffs into the lungs in the morning and  at bedtime. Started by: Marty Morton Shaggy   desloratadine  5 MG tablet Commonly known as: CLARINEX  Take 1 tablet (5 mg total) by mouth daily.   estradiol  0.1 MG/GM vaginal cream Commonly known as: ESTRACE  VAGINAL Place 1 g vaginally 2 (two) times a week. Initial dose: nightly x 1 week, then twice weekly   Excedrin Migraine 250-250-65 MG tablet Generic drug: aspirin-acetaminophen -caffeine Take by mouth.   fluticasone  50 MCG/ACT nasal spray Commonly known as: FLONASE  Place 2 sprays into both nostrils daily.   fluticasone  furoate-vilanterol 100-25 MCG/INH Aepb Commonly known as: BREO ELLIPTA Inhale 1 puff into the lungs at bedtime.   meclizine 25 MG tablet Commonly known as: ANTIVERT Take 25 mg by mouth 3 (three) times daily as needed.   montelukast  10 MG tablet Commonly known as: SINGULAIR  Take 10 mg by mouth at bedtime.   omeprazole 20 MG capsule Commonly known as: PRILOSEC Take 20 mg by mouth daily as needed (acid  reflux).   oxymetazoline  0.05 % nasal spray Commonly known as: Afrin Nasal Spray Place 1 spray into both nostrils 2 (two) times daily.   Wegovy 0.5 MG/0.5ML Soaj Generic drug: Semaglutide-Weight Management Inject 0.5 mg into the skin.   Semaglutide-Weight Management 0.5 MG/0.5ML Soaj 0.5 mg.   spironolactone 50 MG tablet Commonly known as: ALDACTONE Take 50 mg by mouth daily.   tretinoin 0.05 % cream Commonly known as: RETIN-A Apply topically.        Birth History: non-contributory  Developmental History: non-contributory  Past Surgical History: Past Surgical History:  Procedure Laterality Date   ANKLE SURGERY  1983   Left   BREAST CYST EXCISION  06/08/2011   left breast   COLONOSCOPY     DILATION AND CURETTAGE OF UTERUS     ENDOMETRIAL ABLATION  2008   [facility] Option   GYNECOLOGIC CRYOSURGERY  1997   LAPAROSCOPIC GASTRIC SLEEVE RESECTION N/A 05/19/2019   Procedure: LAPAROSCOPIC GASTRIC SLEEVE RESECTION, SUTURE REPAIR OF HIATAL HERNIA, Upper Endo, ERAS Pathway;  Surgeon: Ethyl Lenis, MD;  Location: [facility];  Service: General;  Laterality: N/A;   MOUTH SURGERY     wisdom teeth   PANNICULECTOMY  01/2021     Family History: Family History  Problem Relation Age of Onset   Diabetes Mother    Hypertension Mother    Esophageal cancer Mother    Cancer Mother        esopphageal   Kidney failure Mother    Esophageal cancer Father    Cancer Father        esophageal   Diabetes Sister    Kidney failure Maternal Aunt    Diabetes Maternal Aunt    Kidney failure Maternal Uncle    Diabetes Maternal Uncle    Hypertension Maternal Grandmother    Diabetes Maternal Grandmother    Leukemia Maternal Grandmother    Breast cancer Neg Hx      Social History: Ir[place]ema lives at home with her family.  She lives in a house that is 57 years old.  There is hardwood throughout the home.  She had electric heating and central cooling with gas as well.  There are 2 dogs in the home.   There are no dust mite covers on the bedding.  There is no tobacco exposure.  She is not exposed to fumes, chemicals, or dust.   Review of systems otherwise negative other than that mentioned in the HPI.    Objective:   Blood pressure 110/60, pulse 95, temperature 98.2 F (36.8  C), resp. rate 16, height 5' 2 (1.575 m), weight 166 lb 3.2 oz (75.4 kg), last menstrual period 05/19/2019, SpO2 97%. Body mass index is 30.4 kg/m.     Physical Exam Vitals reviewed.  Constitutional:      Appearance: She is well-developed.     Comments: Very friendly.  Smiling.  HENT:     Head: Normocephalic and atraumatic.     Right Ear: Tympanic membrane, ear canal and external ear normal. No drainage, swelling or tenderness. Tympanic membrane is not injected, scarred, erythematous, retracted or bulging.     Left Ear: Tympanic membrane, ear canal and external ear normal. No drainage, swelling or tenderness. Tympanic membrane is not injected, scarred, erythematous, retracted or bulging.     Nose: Mucosal edema and rhinorrhea present. No nasal deformity or septal deviation.     Right Turbinates: Enlarged, swollen and pale.     Left Turbinates: Enlarged, swollen and pale.     Right Sinus: No maxillary sinus tenderness or frontal sinus tenderness.     Left Sinus: No maxillary sinus tenderness or frontal sinus tenderness.     Comments: Marked congestion.    Mouth/Throat:     Mouth: Mucous membranes are not pale and not dry.     Pharynx: Uvula midline.  Eyes:     General:        Right eye: No discharge.        Left eye: No discharge.     Conjunctiva/sclera: Conjunctivae normal.     Right eye: Right conjunctiva is not injected. No chemosis.    Left eye: Left conjunctiva is not injected. No chemosis.    Pupils: Pupils are equal, round, and reactive to light.  Cardiovascular:     Rate and Rhythm: Normal rate and regular rhythm.     Heart sounds: Normal heart sounds.  Pulmonary:     Effort: Pulmonary  effort is normal. No tachypnea, accessory muscle usage or respiratory distress.     Breath sounds: Normal breath sounds. No wheezing, rhonchi or rales.  Chest:     Chest wall: No tenderness.  Abdominal:     Tenderness: There is no abdominal tenderness. There is no guarding or rebound.  Lymphadenopathy:     Head:     Right side of head: No submandibular, tonsillar or occipital adenopathy.     Left side of head: No submandibular, tonsillar or occipital adenopathy.     Cervical: No cervical adenopathy.  Skin:    Coloration: Skin is not pale.     Findings: No abrasion, erythema, petechiae or rash. Rash is not papular, urticarial or vesicular.  Neurological:     Mental Status: She is alert.  Psychiatric:        Behavior: Behavior is cooperative.      Diagnostic studies:    Spirometry: results normal (FEV1: 1.90/91%, FVC: 2.29/88%, FEV1/FVC: 83%).    Spirometry consistent with normal pattern.   Allergy  Studies: deferred due to insurance stipulations that require a separate visit for testing           Marty Shaggy, MD [facility] 

## 2023-06-06 NOTE — Patient Instructions (Addendum)
 1. Headaches and chronic rhinitis - Because of insurance stipulations, we cannot do skin testing on the same day as your first visit. - We are all working to fight this, but for now we need to do two separate visits.  - We will know more after we do testing at the next visit.  - The skin testing visit can be squeezed in at your convenience.  - Then we can make a more full plan to address all of your symptoms. - Be sure to stop your antihistamines for 3 days before this appointment.   2. Moderate persistent asthma, uncomplicated - Lung testing looks great today.  - We are going to change you from [facility] to Symbicort . - This appears to be cheaper from what I can see. - Symbicort  is two puffs twice daily. - Spacer sample and demonstration provided. - Daily controller medication(s): Symbicort  160/4.66mcg two puffs twice daily with spacer - Prior to physical activity: albuterol  2 puffs 10-15 minutes before physical activity. - Rescue medications: albuterol  4 puffs every 4-6 hours as needed - Asthma control goals:  * Full participation in all desired activities (may need albuterol  before activity) * Albuterol  use two time or less a week on average (not counting use with activity) * Cough interfering with sleep two time or less a month * Oral steroids no more than once a year * No hospitalizations  3. Come back in one week at 4:30 pm for testing.   Please inform us  of any Emergency Department visits, hospitalizations, or changes in symptoms. Call us  before going to the ED for breathing or allergy  symptoms since we might be able to fit you in for a sick visit. Feel free to contact us  anytime with any questions, problems, or concerns.  It was a pleasure to meet you today!  Websites that have reliable patient information: 1. American Academy of Asthma, Allergy , and Immunology: www.aaaai.org 2. Food Allergy  Research and Education (FARE): foodallergy.org 3. Mothers of Asthmatics:  http://www.asthmacommunitynetwork.org 4. American College of Allergy , Asthma, and Immunology: www.acaai.org      "Like" us  on Facebook and Instagram for our latest updates!      A healthy democracy works best when [employer] participate! Make sure you are registered to vote! If you have moved or changed any of your contact information, you will need to get this updated before voting! Scan the QR codes below to learn more!

## 2023-06-06 NOTE — Addendum Note (Signed)
 Addended by: Philipp Deputy on: 06/06/2023 05:30 PM   Modules accepted: Orders

## 2023-06-13 ENCOUNTER — Ambulatory Visit: Payer: BC Managed Care – PPO | Admitting: Allergy & Immunology

## 2023-06-13 DIAGNOSIS — J3089 Other allergic rhinitis: Secondary | ICD-10-CM

## 2023-06-13 DIAGNOSIS — J302 Other seasonal allergic rhinitis: Secondary | ICD-10-CM | POA: Diagnosis not present

## 2023-06-13 DIAGNOSIS — J4540 Moderate persistent asthma, uncomplicated: Secondary | ICD-10-CM

## 2023-06-13 DIAGNOSIS — R519 Headache, unspecified: Secondary | ICD-10-CM

## 2023-06-13 MED ORDER — PREDNISONE 10 MG PO TABS: ORAL_TABLET | ORAL | 0 refills | Status: DC

## 2023-06-13 MED ORDER — LEVOCETIRIZINE DIHYDROCHLORIDE 5 MG PO TABS: 5.0000 mg | ORAL_TABLET | Freq: Two times a day (BID) | ORAL | 1 refills | Status: DC

## 2023-06-13 MED ORDER — RYALTRIS 665-25 MCG/ACT NA SUSP: 2.0000 | Freq: Two times a day (BID) | NASAL | 5 refills | Status: DC

## 2023-06-13 MED ORDER — MONTELUKAST SODIUM 10 MG PO TABS: 10.0000 mg | ORAL_TABLET | Freq: Every day | ORAL | 1 refills | Status: DC

## 2023-06-13 NOTE — Progress Notes (Signed)
FOLLOW UP  Date of Service/Encounter:  06/13/23   Assessment:   Headaches    Perennial and seasonal allergic rhinitis (grasses, ragweed, weeds, trees, indoor molds, outdoor molds, dust mites, cockroach, and tobacco)   Moderate persistent asthma without complication   Obstructive sleep apnea - managed by Dr. 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 Seasonal and perennial allergic rhinitis - Testing today showed: grasses, ragweed, weeds, trees, indoor molds, outdoor molds, dust mites, cockroach, and tobacco - Copy of test results provided.  - Avoidance measures provided. - Stop taking: Clarinex and Flonase  - Continue with: Singulair (montelukast) 10mg  daily - Start taking: Xyzal (levocetirizine) 5mg  tablet TWICE daily and Ryaltris (olopatadine/mometasone) two sprays per nostril 2 times daily - You can use an extra dose of the antihistamine, if needed, for breakthrough symptoms.  - Consider nasal saline rinses 1-2 times daily to remove allergens from the nasal cavities as well as help with mucous clearance (this is especially helpful to do before the nasal sprays are given) - Strongly consider allergy shots as a means of long-term control. - Allergy shots "re-train" and "reset" the immune system to ignore environmental allergens and decrease the resulting immune response to those allergens (sneezing, itchy watery eyes, runny nose, nasal congestion, etc).    - Allergy shots improve symptoms in 75-85% of patients.  - CPT codes provided for shots.   2. Moderate persistent asthma without complication - Lung testing not done today.  - Daily controller medication(s): Symbicort 160/4.3mcg two puffs twice daily with spacer - Prior to physical activity: albuterol 2 puffs 10-15 minutes before physical activity. - Rescue  medications: albuterol 4 puffs every 4-6 hours as needed - Asthma control goals:  * Full participation in all desired activities (may need albuterol before activity) * Albuterol use two time or less a week on average (not counting use with activity) * Cough interfering with sleep two time or less a month * Oral steroids no more than once a year * No hospitalizations  3. Nonintractable headache - These headaches might be related to uncontrolled allergic rhinitis. - Start the prednisone taper sent in today to control inflammation. - Once you get the new allergy meds back on board, I think you might be in good shape. - Call us with updates in case we need to add an antibiotic.  4. Return in about 2 months (around 08/11/2023). You can have the follow up appointment with Dr. Dellis Anes or a Nurse Practicioner (our Nurse Practitioners are excellent and always have Physician oversight!).   Subjective:   Debra Combs is a 58 y.o. female presenting today for follow up of  Chief Complaint  Patient presents with   Allergy Testing    Debra Combs has a history of the following: Patient Active Problem List   Diagnosis Date Noted   Morbid obesity (HCC) 05/19/2019   Obesity 07/25/2018   Menstrual migraine without status migrainosus, not intractable 11/18/2014   Postconcussion syndrome 11/18/2014   Post-concussion headache 07/21/2014   Dizziness    HA (headache)    Status post cryoablation 06/25/2014   Nonhealing surgical wound 08/28/2012   Painful cutaneous scar 05/06/2012    History obtained from: chart review and patient.  Discussed the use of AI scribe software for clinical note transcription with the patient and/or  guardian, who gave verbal consent to proceed.  Debra Combs is a 58 y.o. female presenting for skin testing. She was last seen on January 9. We could not do testing because her insurance company does not cover testing on the same day as a New Patient visit. She has been  off of all antihistamines 3 days in anticipation of the testing.   Otherwise, there have been no changes to her past medical history, surgical history, family history, or social history.    Review of systems otherwise negative other than that mentioned in the HPI.    Objective:   Last menstrual period 05/19/2019. There is no height or weight on file to calculate BMI.    Physical exam deferred since this was a skin testing appointment only.   Diagnostic studies:    Allergy Studies:      Airborne Adult Perc - 06/13/23 1602     Time Antigen Placed 1630    Allergen Manufacturer Waynette Buttery    Location Back    Number of Test 55    1. Control-Buffer 50% Glycerol Negative    2. Control-Histamine 2+    3. Bahia 2+    4. [place] 2+    5. Johnson 2+    6. Kentucky Blue 4+    7. Meadow Fescue 4+    8. Perennial Rye 4+    9. Timothy 4+    10. Ragweed Mix Negative    11. Cocklebur Negative    12. Plantain,  English 3+    13. Baccharis 3+    14. Dog Fennel 2+    15. Guernsey Thistle 2+    16. Lamb's Quarters 3+    17. Sheep Sorrell Negative    18. Rough Pigweed 2+    19. Marsh Elder, Rough 2+    20. Mugwort, Common 3+    21. Box, Elder 3+    22. Cedar, red 3+    23. Sweet Gum 3+    24. Pecan Pollen 3+    25. Pine Mix 3+    26. Walnut, Black Pollen 3+    27. Red Mulberry 3+    28. Ash Mix Negative    29. Birch Mix 3+    30. Beech American 2+    31. Cottonwood, [place] Negative    32. Hickory, White 3+    33. Maple Mix 2+    34. Oak, [place] Mix 3+    35. Sycamore Eastern 3+    36. Alternaria Alternata 2+    37. Cladosporium Herbarum 3+    38. Aspergillus Mix 3+    39. Penicillium Mix 3+    40. Bipolaris Sorokiniana (Helminthosporium) 2+    41. Drechslera Spicifera (Curvularia) 2+    42. Mucor Plumbeus 3+    43. Fusarium Moniliforme 3+    44. Aureobasidium Pullulans (pullulara) 3+    45. Rhizopus Oryzae 3+    46. Botrytis Cinera Negative    47. Epicoccum Nigrum  3+    48. Phoma Betae 3+    49. Dust Mite Mix 3+    50. Cat Hair 10,000 BAU/ml Negative    51.  Dog Epithelia Negative    52. Mixed Feathers Negative    53. Horse Epithelia Negative    54. Cockroach, German 3+    55. Tobacco Leaf 3+             Intradermal - 06/13/23 1631     Time Antigen Placed 1645    Allergen Manufacturer Waynette Buttery  Location Arm    Number of Test 4    Control Negative    Ragweed Mix 3+    Cat Negative    Dog Negative               Allergy testing results were read and interpreted by myself, documented by clinical staff.      Malachi Bonds, MD  [facility]

## 2023-06-13 NOTE — Patient Instructions (Addendum)
1. Seasonal and perennial allergic rhinitis - Testing today showed: grasses, ragweed, weeds, trees, indoor molds, outdoor molds, dust mites, cockroach, and tobacco - Copy of test results provided.  - Avoidance measures provided. - Stop taking: Clarinex and Flonase  - Continue with: Singulair (montelukast) 10mg  daily - Start taking: Xyzal (levocetirizine) 5mg  tablet TWICE daily and Ryaltris (olopatadine/mometasone) two sprays per nostril 2 times daily - You can use an extra dose of the antihistamine, if needed, for breakthrough symptoms.  - Consider nasal saline rinses 1-2 times daily to remove allergens from the nasal cavities as well as help with mucous clearance (this is especially helpful to do before the nasal sprays are given) - Strongly consider allergy shots as a means of long-term control. - Allergy shots "re-train" and "reset" the immune system to ignore environmental allergens and decrease the resulting immune response to those allergens (sneezing, itchy watery eyes, runny nose, nasal congestion, etc).    - Allergy shots improve symptoms in 75-85% of patients.  - CPT codes provided for shots.   2. Moderate persistent asthma without complication - Lung testing not done today.  - Daily controller medication(s): Symbicort 160/4.34mcg two puffs twice daily with spacer - Prior to physical activity: albuterol 2 puffs 10-15 minutes before physical activity. - Rescue medications: albuterol 4 puffs every 4-6 hours as needed - Asthma control goals:  * Full participation in all desired activities (may need albuterol before activity) * Albuterol use two time or less a week on average (not counting use with activity) * Cough interfering with sleep two time or less a month * Oral steroids no more than once a year * No hospitalizations  3. Nonintractable headache - These headaches might be related to uncontrolled allergic rhinitis. - Start the prednisone taper sent in today to control  inflammation. - Once you get the new allergy meds back on board, I think you might be in good shape. - Call us with updates in case we need to add an antibiotic.  4. Return in about 2 months (around 08/11/2023). You can have the follow up appointment with Dr. Dellis Anes or a Nurse Practicioner (our Nurse Practitioners are excellent and always have Physician oversight!).    Please inform us of any Emergency Department visits, hospitalizations, or changes in symptoms. Call us before going to the ED for breathing or allergy symptoms since we might be able to fit you in for a sick visit. Feel free to contact us anytime with any questions, problems, or concerns.  It was a pleasure to see you again today!  Websites that have reliable patient information: 1. American Academy of Asthma, Allergy, and Immunology: www.aaaai.org 2. Food Allergy Research and Education (FARE): foodallergy.org 3. Mothers of Asthmatics: http://www.asthmacommunitynetwork.org 4. American College of Allergy, Asthma, and Immunology: www.acaai.org      "Like" [place] on Facebook and Instagram for our latest updates!      A healthy democracy works best when [employer] participate! Make sure you are registered to vote! If you have moved or changed any of your contact information, you will need to get this updated before voting! Scan the QR codes below to learn more!        Airborne Adult Perc - 06/13/23 1602     Time Antigen Placed 1630    Allergen Manufacturer Waynette Buttery    Location Back    Number of Test 55    1. Control-Buffer 50% Glycerol Negative    2. Control-Histamine 2+    3. Bahia 2+  4. [place] 2+    5. Johnson 2+    6. [place] Blue 4+    7. Meadow Fescue 4+    8. Perennial Rye 4+    9. Timothy 4+    10. Ragweed Mix Negative    11. Cocklebur Negative    12. Plantain,  English 3+    13. Baccharis 3+    14. Dog Fennel 2+    15. [place]ern[place] Thistle 2+    16. Lamb's Quarters 3+    17. Sheep Sorrell Negative     18. Rough Pigweed 2+    19. Marsh Elder, Rough 2+    20. Mugwort, Common 3+    21. Box, Elder 3+    22. Cedar, red 3+    23. Sweet Gum 3+    24. Pecan Pollen 3+    25. Pine Mix 3+    26. Walnut, Black Pollen 3+    27. Red Mulberry 3+    28. Ash Mix Negative    29. Birch Mix 3+    30. Beech American 2+    31. Cottonwood, [place] Negative    32. Hickory, White 3+    33. Maple Mix 2+    34. Oak, [place] Mix 3+    35. Sycamore Eastern 3+    36. Alternaria Alternata 2+    37. Cladosporium Herbarum 3+    38. Aspergillus Mix 3+    39. Penicillium Mix 3+    40. Bipolaris Sorokiniana (Helminthosporium) 2+    41. Drechslera Spicifera (Curvularia) 2+    42. Mucor Plumbeus 3+    43. Fusarium Moniliforme 3+    44. Aureobasidium Pullulans (pullulara) 3+    45. Rhizopus Oryzae 3+    46. Botrytis Cinera Negative    47. Epicoccum Nigrum 3+    48. Phoma Betae 3+    49. Dust Mite Mix 3+    50. Cat Hair 10,000 BAU/ml Negative    51.  Dog Epithelia Negative    52. Mixed Feathers Negative    53. Horse Epithelia Negative    54. Cockroach, German 3+    55. Tobacco Leaf 3+             Intradermal - 06/13/23 1631     Time Antigen Placed 1645    Allergen Manufacturer Waynette Buttery    Location Arm    Number of Test 4    Control Negative    Ragweed Mix 3+    Cat Negative    Dog Negative             Reducing Pollen Exposure  The American Academy of Allergy, Asthma and Immunology suggests the following steps to reduce your exposure to pollen during allergy seasons.    Do not hang sheets or clothing out to dry; pollen may collect on these items. Do not mow lawns or spend time around freshly cut grass; mowing stirs up pollen. Keep windows closed at night.  Keep car windows closed while driving. Minimize morning activities outdoors, a time when pollen counts are usually at their highest. Stay indoors as much as possible when pollen counts or humidity is high and on windy days when  pollen tends to remain in the air longer. Use air conditioning when possible.  Many air conditioners have filters that trap the pollen spores. Use a HEPA room air filter to remove pollen form the indoor air you breathe.  Control of Mold Allergen   Mold and fungi can grow on a variety  of surfaces provided certain temperature and moisture conditions exist.  Outdoor molds grow on plants, decaying vegetation and soil.  The major outdoor mold, Alternaria and Cladosporium, are found in very high numbers during hot and dry conditions.  Generally, a late Summer - Fall peak is seen for common outdoor fungal spores.  Rain will temporarily lower outdoor mold spore count, but counts rise rapidly when the rainy period ends.  The most important indoor molds are Aspergillus and Penicillium.  Dark, humid and poorly ventilated basements are ideal sites for mold growth.  The next most common sites of mold growth are the bathroom and the kitchen.  Outdoor (Seasonal) Mold Control   Use air conditioning and keep windows closed Avoid exposure to decaying vegetation. Avoid leaf raking. Avoid grain handling. Consider wearing a face mask if working in moldy areas.    Indoor (Perennial) Mold Control    Maintain humidity below 50%. Clean washable surfaces with 5% bleach solution. Remove sources e.g. contaminated carpets.    Control of Dust Mite Allergen    Dust mites play a major role in allergic asthma and rhinitis.  They occur in environments with high humidity wherever human skin is found.  Dust mites absorb humidity from the atmosphere (ie, they do not drink) and feed on organic matter (including shed human and animal skin).  Dust mites are a microscopic type of insect that you cannot see with the naked eye.  High levels of dust mites have been detected from mattresses, pillows, carpets, upholstered furniture, bed covers, clothes, soft toys and any woven material.  The principal allergen of the dust mite is  found in its feces.  A gram of dust may contain 1,000 mites and 250,000 fecal particles.  Mite antigen is easily measured in the air during house cleaning activities.  Dust mites do not bite and do not cause harm to humans, other than by triggering allergies/asthma.    Ways to decrease your exposure to dust mites in your home:  Encase mattresses, box springs and pillows with a mite-impermeable barrier or cover   Wash sheets, blankets and drapes weekly in hot water (130 F) with detergent and dry them in a dryer on the hot setting.  Have the room cleaned frequently with a vacuum cleaner and a damp dust-mop.  For carpeting or rugs, vacuuming with a vacuum cleaner equipped with a high-efficiency particulate air (HEPA) filter.  The dust mite allergic individual should not be in a room which is being cleaned and should wait 1 hour after cleaning before going into the room. Do not sleep on upholstered furniture (eg, couches).   If possible removing carpeting, upholstered furniture and drapery from the home is ideal.  Horizontal blinds should be eliminated in the rooms where the person spends the most time (bedroom, study, television room).  Washable vinyl, roller-type shades are optimal. Remove all non-washable stuffed toys from the bedroom.  Wash stuffed toys weekly like sheets and blankets above.   Reduce indoor humidity to less than 50%.  Inexpensive humidity monitors can be purchased at most hardware stores.  Do not use a humidifier as can make the problem worse and are not recommended.  Control of Cockroach Allergen  Cockroach allergen has been identified as an important cause of acute attacks of asthma, especially in urban settings.  There are fifty-five species of cockroach that exist in the [place], however only three, the [place], [place] species produce allergen that can affect patients with Asthma.  Allergens  can be obtained from fecal particles, egg casings and secretions from  cockroaches.    Remove food sources. Reduce access to water. Seal access and entry points. Spray runways with 0.5-1% Diazinon or Chlorpyrifos Blow boric acid power under stoves and refrigerator. Place bait stations (hydramethylnon) at feeding sites.  Allergy Shots  Allergies are the result of a chain reaction that starts in the immune system. Your immune system controls how your body defends itself. For instance, if you have an allergy to pollen, your immune system identifies pollen as an invader or allergen. Your immune system overreacts by producing antibodies called Immunoglobulin E (IgE). These antibodies travel to cells that release chemicals, causing an allergic reaction.  The concept behind allergy immunotherapy, whether it is received in the form of shots or tablets, is that the immune system can be desensitized to specific allergens that trigger allergy symptoms. Although it requires time and patience, the payback can be long-term relief. Allergy injections contain a dilute solution of those substances that you are allergic to based upon your skin testing and allergy history.   How Do Allergy Shots Work?  Allergy shots work much like a vaccine. Your body responds to injected amounts of a particular allergen given in increasing doses, eventually developing a resistance and tolerance to it. Allergy shots can lead to decreased, minimal or no allergy symptoms.  There generally are two phases: build-up and maintenance. Build-up often ranges from three to six months and involves receiving injections with increasing amounts of the allergens. The shots are typically given once or twice a week, though more rapid build-up schedules are sometimes used.  The maintenance phase begins when the most effective dose is reached. This dose is different for each person, depending on how allergic you are and your response to the build-up injections. Once the maintenance dose is reached, there are longer  periods between injections, typically two to four weeks.  Occasionally doctors give cortisone-type shots that can temporarily reduce allergy symptoms. These types of shots are different and should not be confused with allergy immunotherapy shots.  Who Can Be Treated with Allergy Shots?  Allergy shots may be a good treatment approach for people with allergic rhinitis (hay fever), allergic asthma, conjunctivitis (eye allergy) or stinging insect allergy.   Before deciding to begin allergy shots, you should consider:   The length of allergy season and the severity of your symptoms  Whether medications and/or changes to your environment can control your symptoms  Your desire to avoid long-term medication use  Time: allergy immunotherapy requires a major time commitment  Cost: may vary depending on your insurance coverage  Allergy shots for children age 54 and older are effective and often well tolerated. They might prevent the onset of new allergen sensitivities or the progression to asthma.  Allergy shots are not started on patients who are pregnant but can be continued on patients who become pregnant while receiving them. In some patients with other medical conditions or who take certain common medications, allergy shots may be of risk. It is important to mention other medications you talk to your allergist.   What are the two types of build-ups offered:   RUSH or Rapid Desensitization -- one day of injections lasting from 8:30-4:30pm, injections every 1 hour.  Approximately half of the build-up process is completed in that one day.  The following week, normal build-up is resumed, and this entails ~16 visits either weekly or twice weekly, until reaching your "maintenance dose" which is continued  weekly until eventually getting spaced out to every month for a duration of 3 to 5 years. The regular build-up appointments are nurse visits where the injections are administered, followed by required  monitoring for 30 minutes.    Traditional build-up -- weekly visits for 6 -12 months until reaching "maintenance dose", then continue weekly until eventually spacing out to every 4 weeks as above. At these appointments, the injections are administered, followed by required monitoring for 30 minutes.     Either way is acceptable, and both are equally effective. With the rush protocol, the advantage is that less time is spent here for injections overall AND you would also reach maintenance dosing faster (which is when the clinical benefit starts to become more apparent). Not everyone is a candidate for rapid desensitization.   IF we proceed with the RUSH protocol, there are premedications which must be taken the day before and the day after the rush only (this includes antihistamines, steroids, and Singulair).  After the rush day, no prednisone or Singulair is required, and we just recommend antihistamines taken on your injection day.  What Is An Estimate of the Costs?  If you are interested in starting allergy injections, please check with your insurance company about your coverage for both allergy vial sets and allergy injections.  Please do so prior to making the appointment to start injections.  The following are CPT codes to give to your insurance company. These are the amounts we BILL to the insurance company, but the amount YOU WILL PAY and WE RECEIVE IS SUBSTANTIALLY LESS and depends on the contracts we have with different insurance companies.   Amount Billed to Insurance One allergy vial set  CPT 95165   $ 1200     Two allergy vial set  CPT 95165   $ 2400     Three allergy vial set  CPT 95165   $ 3600     One injection   CPT 95115   $ 35  Two injections   CPT 95117   $ 40 RUSH (Rapid Desensitization) CPT 95180 x 8 hours $500/hour  Regarding the allergy injections, your co-pay may or may not apply with each injection, so please confirm this with your insurance company. When you start  allergy injections, 1 or 2 sets of vials are made based on your allergies.  Not all patients can be on one set of vials. A set of vials lasts 6 months to a year depending on how quickly you can proceed with your build-up of your allergy injections. Vials are personalized for each patient depending on their specific allergens.  How often are allergy injection given during the build-up period?   Injections are given at least weekly during the build-up period until your maintenance dose is achieved. Per the doctor's discretion, you may have the option of getting allergy injections two times per week during the build-up period. However, there must be at least 48 hours between injections. The build-up period is usually completed within 6-12 months depending on your ability to schedule injections and for adjustments for reactions. When maintenance dose is reached, your injection schedule is gradually changed to every two weeks and later to every three weeks. Injections will then continue every 4 weeks. Usually, injections are continued for a total of 3-5 years.   When Will I Feel Better?  Some may experience decreased allergy symptoms during the build-up phase. For others, it may take as long as 12 months on the maintenance dose. If  there is no improvement after a year of maintenance, your allergist will discuss other treatment options with you.  If you aren't responding to allergy shots, it may be because there is not enough dose of the allergen in your vaccine or there are missing allergens that were not identified during your allergy testing. Other reasons could be that there are high levels of the allergen in your environment or major exposure to non-allergic triggers like tobacco smoke.  What Is the Length of Treatment?  Once the maintenance dose is reached, allergy shots are generally continued for three to five years. The decision to stop should be discussed with your allergist at that time. Some  people may experience a permanent reduction of allergy symptoms. Others may relapse and a longer course of allergy shots can be considered.  What Are the Possible Reactions?  The two types of adverse reactions that can occur with allergy shots are local and systemic. Common local reactions include very mild redness and swelling at the injection site, which can happen immediately or several hours after. Report a delayed reaction from your last injection. These include arm swelling or runny nose, watery eyes or cough that occurs within 12-24 hours after injection. A systemic reaction, which is less common, affects the entire body or a particular body system. They are usually mild and typically respond quickly to medications. Signs include increased allergy symptoms such as sneezing, a stuffy nose or hives.   Rarely, a serious systemic reaction called anaphylaxis can develop. Symptoms include swelling in the throat, wheezing, a feeling of tightness in the chest, nausea or dizziness. Most serious systemic reactions develop within 30 minutes of allergy shots. This is why it is strongly recommended you wait in your doctor's office for 30 minutes after your injections. Your allergist is trained to watch for reactions, and his or her staff is trained and equipped with the proper medications to identify and treat them.   Report to the nurse immediately if you experience any of the following symptoms: swelling, itching or redness of the skin, hives, watery eyes/nose, breathing difficulty, excessive sneezing, coughing, stomach pain, diarrhea, or light headedness. These symptoms may occur within 15-20 minutes after injection and may require medication.   Who Should Administer Allergy Shots?  The preferred location for receiving shots is your prescribing allergist's office. Injections can sometimes be given at another facility where the physician and staff are trained to recognize and treat reactions, and have  received instructions by your prescribing allergist.  What if I am late for an injection?   Injection dose will be adjusted depending upon how many days or weeks you are late for your injection.   What if I am sick?   Please report any illness to the nurse before receiving injections. She may adjust your dose or postpone injections depending on your symptoms. If you have fever, flu, sinus infection or chest congestion it is best to postpone allergy injections until you are better. Never get an allergy injection if your asthma is causing you problems. If your symptoms persist, seek out medical care to get your health problem under control.  What If I am or Become Pregnant:  Women that become pregnant should schedule an appointment with [facility] before receiving any further allergy injections.

## 2023-06-14 ENCOUNTER — Other Ambulatory Visit: Payer: Self-pay | Admitting: Allergy & Immunology

## 2023-06-17 ENCOUNTER — Other Ambulatory Visit: Payer: Self-pay | Admitting: Allergy & Immunology

## 2023-06-17 ENCOUNTER — Encounter: Payer: Self-pay | Admitting: Allergy & Immunology

## 2023-06-17 ENCOUNTER — Other Ambulatory Visit: Payer: Self-pay

## 2023-06-17 MED ORDER — LEVOCETIRIZINE DIHYDROCHLORIDE 5 MG PO TABS: 5.0000 mg | ORAL_TABLET | Freq: Every day | ORAL | 1 refills | Status: AC | PRN

## 2023-06-25 DIAGNOSIS — G4733 Obstructive sleep apnea (adult) (pediatric): Secondary | ICD-10-CM | POA: Diagnosis not present

## 2023-07-23 DIAGNOSIS — J45909 Unspecified asthma, uncomplicated: Secondary | ICD-10-CM | POA: Diagnosis not present

## 2023-07-23 DIAGNOSIS — Z0000 Encounter for general adult medical examination without abnormal findings: Secondary | ICD-10-CM | POA: Diagnosis not present

## 2023-07-23 DIAGNOSIS — Z7985 Long-term (current) use of injectable non-insulin antidiabetic drugs: Secondary | ICD-10-CM | POA: Diagnosis not present

## 2023-07-23 DIAGNOSIS — Z130 Encounter for screening for diseases of the blood and blood-forming organs and certain disorders involving the immune mechanism: Secondary | ICD-10-CM | POA: Diagnosis not present

## 2023-07-23 DIAGNOSIS — J302 Other seasonal allergic rhinitis: Secondary | ICD-10-CM | POA: Diagnosis not present

## 2023-07-23 DIAGNOSIS — Z13228 Encounter for screening for other metabolic disorders: Secondary | ICD-10-CM | POA: Diagnosis not present

## 2023-07-23 DIAGNOSIS — L700 Acne vulgaris: Secondary | ICD-10-CM | POA: Diagnosis not present

## 2023-07-26 ENCOUNTER — Other Ambulatory Visit: Payer: Self-pay | Admitting: Allergy & Immunology

## 2023-07-26 DIAGNOSIS — J302 Other seasonal allergic rhinitis: Secondary | ICD-10-CM

## 2023-07-26 NOTE — Progress Notes (Signed)
Allergy shot script written.   Amiel Sharrow, MD [facility]  

## 2023-07-29 DIAGNOSIS — J45909 Unspecified asthma, uncomplicated: Secondary | ICD-10-CM | POA: Diagnosis not present

## 2023-07-29 DIAGNOSIS — L700 Acne vulgaris: Secondary | ICD-10-CM | POA: Diagnosis not present

## 2023-07-29 DIAGNOSIS — E6609 Other obesity due to excess calories: Secondary | ICD-10-CM | POA: Diagnosis not present

## 2023-07-29 DIAGNOSIS — Z7985 Long-term (current) use of injectable non-insulin antidiabetic drugs: Secondary | ICD-10-CM | POA: Diagnosis not present

## 2023-07-29 DIAGNOSIS — J3089 Other allergic rhinitis: Secondary | ICD-10-CM

## 2023-07-29 NOTE — Progress Notes (Signed)
 Aeroallergen Immunotherapy  Ordering Provider: Dr. Malachi Bonds  Patient Details Name: Debra Combs MRN: [id number] Date of Birth: Dec 15, 1965  Order 2 of 2  Vial Label: Molds/CR  0.2 ml (Volume)  1:20 Concentration -- Alternaria alternata 0.2 ml (Volume)  1:20 Concentration -- Cladosporium herbarum 0.2 ml (Volume)  1:10 Concentration -- Aspergillus mix 0.2 ml (Volume)  1:10 Concentration -- Penicillium mix 0.2 ml (Volume)  1:20 Concentration -- Bipolaris sorokiniana 0.2 ml (Volume)  1:20 Concentration -- Drechslera spicifera 0.2 ml (Volume)  1:10 Concentration -- Mucor plumbeus 0.2 ml (Volume)  1:10 Concentration -- Fusarium moniliforme 0.2 ml (Volume)  1:40 Concentration -- Aureobasidium pullulans 0.2 ml (Volume)  1:10 Concentration -- Rhizopus oryzae 0.2 ml (Volume)  1:40 Concentration -- Epicoccum nigrum 0.2 ml (Volume)  1:40 Concentration -- Phoma betae 0.3 ml (Volume)  1:20 Concentration -- Cockroach, German   2.7  ml Extract Subtotal 2.3  ml Diluent 5.0  ml Maintenance Total  Schedule:  B Silver Vial (1:1,000,000): Blue Vial (1:100,000): Schedule B (6 doses) Yellow Vial (1:10,000): Schedule B (6 doses) [place] Vial (1:1,000): Schedule B (6 doses) Red Vial (1:100): Schedule A (12 doses)  Special Instructions: After completion of the first Red Vial, please space to every two weeks. After completion of the second Red Vial, please space to every 4 weeks. Ok to up dose new vials at 0.55mL --> 0.3 mL --> 0.5 mL. Ok to come twice weekly, if desired, as long as there is 48 hours between injections.

## 2023-07-29 NOTE — Progress Notes (Signed)
 Aeroallergen Immunotherapy  Ordering Provider: Dr. Malachi Bonds  Patient Details Name: Debra Combs MRN: [id number] Date of Birth: [birth year]  Order 1 of 2  Vial Label: G/RW/W/T/DM  0.3 ml (Volume)  BAU Concentration -- 7 Grass Mix* 100,000 ([address] [place], [place], [place], [place] Rye, RedTop, Sweet Vernal, Timothy) 0.2 ml (Volume)  1:20 Concentration -- Bahia 0.3 ml (Volume)  BAU Concentration -- [place] 10,000 0.2 ml (Volume)  1:20 Concentration -- Johnson 0.3 ml (Volume)  1:20 Concentration -- Ragweed Mix 0.2 ml (Volume)  1:20 Concentration -- [place]ern[place] Thistle 0.2 ml (Volume)  1:40 Concentration -- Baccharis 0.2 ml (Volume)  1:80 Concentration -- Dogfennel 0.5 ml (Volume)  1:20 Concentration -- Weed Mix* 0.5 ml (Volume)  1:20 Concentration -- Eastern 10 Tree Mix (also Sweet Gum) 0.2 ml (Volume)  1:20 Concentration -- Box Elder 0.2 ml (Volume)  1:10 Concentration -- Cedar, red 0.2 ml (Volume)  1:10 Concentration -- Pecan Pollen 0.2 ml (Volume)  1:20 Concentration -- Red Mulberry 0.2 ml (Volume)  1:20 Concentration -- Sweet Gum 0.5 ml (Volume)   AU Concentration -- Mite Mix (DF 5,000 & DP 5,000)   4.4  ml Extract Subtotal 0.6  ml Diluent 5.0  ml Maintenance Total  Schedule:  B  .Blue Vial (1:100,000): Schedule B (6 doses) Yellow Vial (1:10,000): Schedule B (6 doses) [place] Vial (1:1,000): Schedule B (6 doses) Red Vial (1:100): Schedule A (12 doses)  Special Instructions: After completion of the first Red Vial, please space to every two weeks. After completion of the second Red Vial, please space to every 4 weeks. Ok to up dose new vials at 0.67mL --> 0.3 mL --> 0.5 mL. Ok to come twice weekly, if desired, as long as there is 48 hours between injections.

## 2023-07-29 NOTE — Progress Notes (Signed)
 VIAL SET 1 MADE ON 07/29/23 EXP 07/28/24

## 2023-07-30 DIAGNOSIS — J302 Other seasonal allergic rhinitis: Secondary | ICD-10-CM

## 2023-07-30 NOTE — Progress Notes (Signed)
 VIAL SET 2 MADE 07-30-23. EXP 07-29-24.

## 2023-08-02 ENCOUNTER — Encounter (INDEPENDENT_AMBULATORY_CARE_PROVIDER_SITE_OTHER): Payer: Self-pay | Admitting: Otolaryngology

## 2023-08-02 ENCOUNTER — Ambulatory Visit (INDEPENDENT_AMBULATORY_CARE_PROVIDER_SITE_OTHER): Payer: BC Managed Care – PPO | Admitting: Otolaryngology

## 2023-08-02 VITALS — BP 134/77 | HR 78

## 2023-08-02 DIAGNOSIS — J342 Deviated nasal septum: Secondary | ICD-10-CM

## 2023-08-02 DIAGNOSIS — R0981 Nasal congestion: Secondary | ICD-10-CM | POA: Diagnosis not present

## 2023-08-02 DIAGNOSIS — G4730 Sleep apnea, unspecified: Secondary | ICD-10-CM

## 2023-08-02 DIAGNOSIS — R519 Headache, unspecified: Secondary | ICD-10-CM

## 2023-08-02 DIAGNOSIS — J3089 Other allergic rhinitis: Secondary | ICD-10-CM

## 2023-08-02 DIAGNOSIS — J343 Hypertrophy of nasal turbinates: Secondary | ICD-10-CM

## 2023-08-02 NOTE — Progress Notes (Signed)
 ENT Progress Note:   Update 08/03/2023  Discussed the use of AI scribe software for clinical note transcription with the patient, who gave verbal consent to proceed.  History of Present Illness   The patient presents for f/u of chronic nasal congestion and facial pain/pressure.  She has been experiencing persistent nasal congestion. The nasal congestion affects her ability to use her CPAP machine for sleep apnea. The congestion is present daily, and she sometimes feels congested even when she can breathe through her nose. Her facial pain/pressure have improved by about 80%, but she still experiences slight headaches when turning over, located in the mid-face area.  She uses a nasal spray, Ryaltris, which is supposed to be used twice daily, but she only uses it once due to side effects such as epistaxis and a raw, sensitive feeling in her nose. Despite reduced usage, she notes an improvement in her symptoms.  She has a history of septal deviation and turbinate hypertrophy, which were discussed in previous consultations. She is compliant with CPAP.    Records Reviewed:  Initial Evaluation  Update 04/29/23:  Discussed the use of AI scribe software for clinical note transcription with the patient, who gave verbal consent to proceed.  History of Present Illness   The patient with hx of chronic nasal congestion headaches and facial pain/pressure, presents for f/u after CT sinuses. They report that the headaches have decreased in frequency, but the sensation of pressure persists. The patient notes some improvement in their ability to sleep on their side without immediate onset of facial pressure. However, they continue to experience nasal stuffiness, particularly in the left nostril, which has been accompanied by blood tinged nasal secretions when she blows her nose. The patient attributes this to a cold a few weeks prior, or to the current dry and cold weather conditions.  The patient is currently  on Clarinex, Flonase, and Singulair. Despite this regimen, they continue to experience symptoms, albeit with some small improvements. They have a history of environmental allergies, confirmed by previous testing.  The patient has a history of headaches, for which they have seen a neurologist in the past. They were previously prescribed topiramate, but discontinued it due to side effects of forgetfulness that impacted their work. They currently manage their headaches with extra strength Tylenol.  The patient also uses a CPAP machine for sleep apnea, which they report difficulty tolerating due to a sensation of suffocation and difficulty breathing.       Initial consultation 02/27/23 Reason for Consult: Chronic headache and facial pain and pressure  HPI: Debra Combs is an 58 y.o. female with hx of GERD, hx gastric sleeve for weight loss in 2020, currently on Wegovy, hx of OSA, migraine headaches, previously on Topamax but did not tolerate side effects and currently treats with Tylenol when she gets headaches, here for evaluation of right-sided headache and facial pain pressure x 1 year in the setting of seasonal allergies that she currently treats with Xyzal and montelukast.  Of note the initial referral was placed for dizziness and giddiness, but the patient denies any symptoms at this time, she had dizziness/vertigo but it self resolved without any interventions.  She reports 1 year of right-sided headache that gets worse when she lies on the right side, associated with facial pain and pressure, and can be moderate to severe, typically improves when she rolls over onto her back.  Takes Tylenol for it.  She also reports chronic nasal congestion, with similar presentation where he  gets worse when she lies on the right side and improves when she rolls over and lies on her back.  Not on nasal sprays uses Xyzal and montelukast daily   Records Reviewed:  [facility]  Office Visit 01/02/23 with  [facility] Duanna Runk is a 58 y.o. female who is status post laparoscopic sleeve gastrectomy and suture repair of hiatal hernia on 05/19/2019 by [name]. Preoperative weight was 208 pounds. She was last seen in January 2022 which time she weighed 158 pounds. At that time, she had a goal weight of 150 pounds. She was walking 45 minutes daily at the [facility] and also twice a day with her husband for about 20 to 30 minutes. She saw Dr. [name] at [facility] in 2023 and underwent panniculectomy.   The patient currently weighs 174 pounds and states she started to regain weight about 1 year ago. 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 She changed jobs and has experienced increased stress. Since she does not work from home anyone, she has been eating out more since she is not able to meal prep. She denies any issues with eating and drinking but states she does have a bowel movement about 30 minutes after most meals, especially if they are higher in fat. She still has her gallbladder and does not know if she has gallstones. She states she had a sleep study about 2 weeks ago and was diagnosed with sleep apnea again. She has ordered a CPAP machine. She is taking her multivitamin but admits to not taking her calcium regularly.  She states she is also interested in having a lipoma removed on her right lower back which has been present for several years but has grown recently. She states there is painful at times.  Past Surgical History:  Procedure Laterality Date  GASTRIC SLEEVE 04/2019  PANNICULECTOMY 01/2021  DILATION AND CURETTAGE, DIAGNOSTIC / THERAPEUTIC  2004, 2018     Past Medical History:  Diagnosis Date   Acne    Anxiety    Asthma    Breast cyst    left breast   Cervical dysplasia 1997   Dizziness    GERD (gastroesophageal reflux disease)    HA (headache)    Post-concussion headache  07/21/2014    Past Surgical History:  Procedure Laterality Date   ANKLE SURGERY  1983   Left   BREAST CYST EXCISION  06/08/2011   left breast   COLONOSCOPY     DILATION AND CURETTAGE OF UTERUS     ENDOMETRIAL ABLATION  2008   Her Option   GYNECOLOGIC CRYOSURGERY  1997   LAPAROSCOPIC GASTRIC SLEEVE RESECTION N/A 05/19/2019   Procedure: LAPAROSCOPIC GASTRIC SLEEVE RESECTION, SUTURE REPAIR OF HIATAL HERNIA, Upper Endo, ERAS Pathway;  Surgeon: Ovidio Kin, MD;  Location: [facility];  Service: General;  Laterality: N/A;   MOUTH SURGERY     wisdom teeth   PANNICULECTOMY  01/2021    Family History  Problem Relation Age of Onset   Diabetes Mother    Hypertension Mother    Esophageal cancer Mother    Cancer Mother        esopphageal   Kidney failure Mother    Esophageal cancer Father    Cancer Father        esophageal   Diabetes Sister    Kidney failure Maternal Aunt    Diabetes Maternal Aunt  Kidney failure Maternal Uncle    Diabetes Maternal Uncle    Hypertension Maternal Grandmother    Diabetes Maternal Grandmother    Leukemia Maternal Grandmother    Breast cancer Neg Hx     Social History:  reports that she has never smoked. She has been exposed to tobacco smoke. She has never used smokeless tobacco. She reports that she does not drink alcohol and does not use drugs.  Allergies:  Allergies  Allergen Reactions   Avelox [Moxifloxacin Hcl In Nacl] Nausea Only    Severe stomach ache   Benzonatate Other (See Comments)    Dizziness, tingling of lips, fingers   Chlorhexidine Gluconate     Minor redness and itching   Nitrofurantoin Monohyd Macro    Sulfa Antibiotics    Zithromax [Azithromycin Dihydrate] Nausea And Vomiting    Medications: I have reviewed the patient's current medications.  The PMH, PSH, Medications, Allergies, and SH were reviewed and updated.  ROS: Constitutional: Negative for fever, weight loss and weight gain. Cardiovascular: Negative for chest  pain and dyspnea on exertion. Respiratory: Is not experiencing shortness of breath at rest. Gastrointestinal: Negative for nausea and vomiting. Neurological: Negative for headaches. Psychiatric: The patient is not nervous/anxious  Blood pressure 134/77, pulse 78, last menstrual period 05/19/2019, SpO2 99%.  PHYSICAL EXAM:  Exam: General: Well-developed, well-nourished Respiratory Respiratory effort: Equal inspiration and expiration without stridor Cardiovascular Peripheral Vascular: Warm extremities with equal color/perfusion Eyes: No nystagmus with equal extraocular motion bilaterally Neuro/Psych/Balance: Patient oriented to person, place, and time; Appropriate mood and affect; Gait is intact with no imbalance; Cranial nerves I-XII are intact Head and Face Inspection: Normocephalic and atraumatic without mass or lesion Palpation: Facial skeleton intact without bony stepoffs Salivary Glands: No mass or tenderness Facial Strength: Facial motility symmetric and full bilaterally ENT Pinna: External ear intact and fully developed External canal: Canal is patent with intact skin Tympanic Membrane: Clear and mobile External Nose: No scar or anatomic deformity Internal Nose: Septum is with Right sided deviation, left sided septal spur. No polyp, or purulence. Mucosal edema and erythema present.  Bilateral inferior turbinate hypertrophy on anterior rhinoscopy.  Lips, Teeth, and gums: Mucosa and teeth intact and viable TMJ: No pain to palpation with full mobility Oral cavity/oropharynx: No erythema or exudate, no lesions present Neck Neck and Trachea: Midline trachea without mass or lesion  Studies Reviewed: CT head done 01/06/2019 CLINICAL DATA:  Headache and nausea.  History of seizures   EXAM: CT HEAD WITHOUT CONTRAST   TECHNIQUE: Contiguous axial images were obtained from the base of the skull through the vertex without intravenous contrast.   COMPARISON:  February 27, 2018    FINDINGS: Brain: Ventricles are normal in size and configuration. There is no intracranial mass, hemorrhage, extra-axial fluid collection, or midline shift. Brain parenchyma appears unremarkable. No acute infarct evident.   Vascular: No hyperdense vessel. There is no appreciable vascular calcification.   Skull: The bony calvarium appears intact.   Sinuses/Orbits: Paranasal sinuses are clear. Orbits appear symmetric bilaterally.   Other: Mastoid air cells are clear.   IMPRESSION: Study within normal limits.  CT sinuses 03/04/23 FINDINGS: Osseous: No regional bone abnormality.   Orbits: No orbital abnormality on either side.   Sinuses: Frontal, ethmoid, maxillary and sphenoid sinuses are clear. No acute or chronic inflammatory changes. No significant unfavorable anatomic variants.   Soft tissues: Normal   Limited intracranial: Normal   IMPRESSION: Normal CT scan of the face. No evidence of sinusitis. No significant  unfavorable anatomic variants.  Assessment/Plan: Encounter Diagnoses  Name Primary?   Chronic nasal congestion Yes   Hypertrophy of both inferior nasal turbinates    Nasal septal deviation    Environmental and seasonal allergies    Facial pain       58 year old female with known history of migraine headaches and also documented history of post-concussion headaches in the chart here for 1 year of chronic right sided headache associated with nasal congestion and facial pain pressure, in the setting of known environmental seasonal allergies which she treats with Xyzal and montelukast currently.  DDx includes migraine HA vs chronic sinus inflammation causing facial pain and pressure vs nasal congestion/allergies/aerosinusitis.    Right facial headache -continue to treat with Tylenol and consider NSAIDs when symptoms become significant or do not respond to Tylenol -Will refer to neurology after adequate workup for chronic sinusitis and allergies have been  completed - CT sinuses to rule out chronic sinus disease -CT head I personally reviewed which only imaged portion of her paranasal sinuses that was done in 2020 did not have any chronic sinus inflammation -Try over-the-counter Sudafed when the headaches are severe -Consider Afrin x 48 hrs 2 puffs b/l nares when the headache and nasal congestion are severe 2.  Nasal congestion/environmental allergies  -Stop the Xyzal and start Clarinex 5 mg daily -Start Flonase 2 puffs bilateral nares twice daily -Continue montelukast 10 mg daily 3.  Nasal obstruction and septal deviation inferior turbinate hypertrophy -Evidence of narrowing of left more than right nasal passages due to inferior turban hypertrophy mucosal edema and septal deviation on nasal endoscopy today -Medical management as above -If indicated and symptoms not improved following maximal medical management will consider Septo/ITR in the future  Update 04/29/23: Assessment and Plan    Chronic Nasal Congestion with Septal Deviation and Inferior turbinate hypertrophy on CT and exam. Persistent nasal congestion with no chronic sinus disease on CT sinuses. Symptoms include facial pressure and nasal stuffiness, particularly in the left nostril. Currently on Clarinex, Flonase, and montelukast. Sx are likely related to environmental allergies and  - Continue Clarinex, Flonase, and montelukast - Consider Sudafed or Afrin for acute symptom relief - [facility] - Follow up in a few months to reassess need for septoplasty and turbinate reduction  Environmental allergies, reported hx of (+) allergy testing many years ago - medical management of sx as above - referral to Allergy for evaluation possible repeat allergy testing   Septoplasty and Inferior Turbinate Hypertrophy Discussed potential for septoplasty and turbinate reduction if medical management fails. Explained septoplasty and turbinate reduction  are outpatient procedures with a typical recovery time of a few days, with nasal congestion as the primary postoperative symptom. Emphasized excluding other causes of sx such as environmental allergies which likely contributes to her sx - continue medical management of allergies  - will consider surgical management in the future if medical management fails to improve sx   Epistaxis Intermittent blood tinged nasal secretions likely secondary to nasal dryness with cold weather. Symptoms include bleeding from the left nostril when blowing the nose. - Use humidifier to alleviate dryness - Vaseline and nasal saline spray   Headache Intermittent headaches with facial pressure, not related to chronic sinus disease (CT sinuses negative for CRS). Previous use of topiramate discontinued due to side effects. Current management includes extra strength Tylenol. Discussed potential benefits of NSAIDs for headache management. Explained NSAIDs, such as ibuprofen, are more effective for tension-type  and migraine headaches due to their anti-inflammatory properties. - Recommend ibuprofen 400-600 mg every 6 hours as needed for headache - Consider referral to neurology for further headache management if symptoms persist  General Health Maintenance Discussed importance of allergy testing and potential allergy shots. Reviewed current medications and their refills. - Check medication refills for Clarinex and Flonase - Send prescriptions to preferred pharmacy if needed  Update 08/03/2023 Assessment and Plan    Chronic Nasal congestion with hx of septal deviation and turbinate hypertrophy Persistent nasal congestion likely exacerbated by septal deviation and turbinate hypertrophy. Occasional headaches have improved by 80% and are likely related to chronic nasal congestion. Had CT sinuses which did not reveal CRS. Allergy shots have been initiated but discussed that immunotherapy requires time to become effective. She  uses a combination nasal spray but experiences epistaxis and cut down to daily instead of BID use. Discussed septoplasty/ITR to address anatomical obstruction, including procedure details and recovery expectations, noting it is not first-line treatment. She prefers to wait for the effects of allergy shots before considering surgery. - Continue allergy shots as planned. - Use nasal spray as tolerated, considering side effects. - Reassess nasal congestion and symptoms in 4-6 months. - Consider septoplasty/ITR if symptoms persist and are bothersome.  Environmental allergies - continue allergy shots - continue Ryaltris daily  Sleep apnea on CPAP Under the care of Dr. Dellis Anes for sleep apnea and uses a CPAP machine. Reports difficulty using CPAP due to nasal congestion. Emphasized the importance of humidification and saline spray to manage nasal symptoms associated with CPAP use. Tolerating CPAP, but nasal congestion remains a challenge. - Continue CPAP therapy with humidification. - Use saline spray to manage nasal dryness.   Epistaxis  - nasal saline and nasal lubricants to prevent epistaxis.        Ashok Croon, MD Otolaryngology [facility] Phone: [phone] Fax: [phone]    08/03/2023, 7:40 PM

## 2023-08-05 DIAGNOSIS — L700 Acne vulgaris: Secondary | ICD-10-CM | POA: Diagnosis not present

## 2023-08-05 DIAGNOSIS — J45909 Unspecified asthma, uncomplicated: Secondary | ICD-10-CM | POA: Diagnosis not present

## 2023-08-05 DIAGNOSIS — E6609 Other obesity due to excess calories: Secondary | ICD-10-CM | POA: Diagnosis not present

## 2023-08-05 DIAGNOSIS — Z7985 Long-term (current) use of injectable non-insulin antidiabetic drugs: Secondary | ICD-10-CM | POA: Diagnosis not present

## 2023-08-06 DIAGNOSIS — M1812 Unilateral primary osteoarthritis of first carpometacarpal joint, left hand: Secondary | ICD-10-CM | POA: Diagnosis not present

## 2023-08-12 DIAGNOSIS — J45909 Unspecified asthma, uncomplicated: Secondary | ICD-10-CM | POA: Diagnosis not present

## 2023-08-12 DIAGNOSIS — E6609 Other obesity due to excess calories: Secondary | ICD-10-CM | POA: Diagnosis not present

## 2023-08-12 DIAGNOSIS — L700 Acne vulgaris: Secondary | ICD-10-CM | POA: Diagnosis not present

## 2023-08-12 DIAGNOSIS — Z7985 Long-term (current) use of injectable non-insulin antidiabetic drugs: Secondary | ICD-10-CM | POA: Diagnosis not present

## 2023-08-19 ENCOUNTER — Ambulatory Visit: Payer: BC Managed Care – PPO

## 2023-08-20 DIAGNOSIS — J45909 Unspecified asthma, uncomplicated: Secondary | ICD-10-CM | POA: Diagnosis not present

## 2023-08-20 DIAGNOSIS — L700 Acne vulgaris: Secondary | ICD-10-CM | POA: Diagnosis not present

## 2023-08-20 DIAGNOSIS — E6609 Other obesity due to excess calories: Secondary | ICD-10-CM | POA: Diagnosis not present

## 2023-08-20 DIAGNOSIS — Z7985 Long-term (current) use of injectable non-insulin antidiabetic drugs: Secondary | ICD-10-CM | POA: Diagnosis not present

## 2023-08-21 ENCOUNTER — Other Ambulatory Visit: Payer: Self-pay | Admitting: *Deleted

## 2023-08-21 MED ORDER — EPINEPHRINE 0.3 MG/0.3ML IJ SOAJ: 0.3000 mg | INTRAMUSCULAR | 1 refills | Status: AC | PRN

## 2023-08-22 ENCOUNTER — Ambulatory Visit (INDEPENDENT_AMBULATORY_CARE_PROVIDER_SITE_OTHER)

## 2023-08-22 DIAGNOSIS — J309 Allergic rhinitis, unspecified: Secondary | ICD-10-CM

## 2023-08-22 NOTE — Progress Notes (Signed)
 Immunotherapy   Patient Details  Name: Debra Combs MRN: [id number] Date of Birth: 01/19/1966  08/22/2023  [name] started injections for G-RW-W-T-DM and Molds-CR. Patient received 0.05 out of her blue vials with an expiration of 07/28/2024. Patient waited 30 minutes with no problems.  Following schedule: B  Frequency:2 times per week Epi-Pen:Epi-Pen Available  Consent signed and patient instructions given.   Dub Mikes 08/22/2023, 3:33 PM

## 2023-08-23 DIAGNOSIS — E559 Vitamin D deficiency, unspecified: Secondary | ICD-10-CM | POA: Diagnosis not present

## 2023-08-23 DIAGNOSIS — Z0000 Encounter for general adult medical examination without abnormal findings: Secondary | ICD-10-CM | POA: Diagnosis not present

## 2023-08-26 DIAGNOSIS — L700 Acne vulgaris: Secondary | ICD-10-CM | POA: Diagnosis not present

## 2023-08-26 DIAGNOSIS — E6609 Other obesity due to excess calories: Secondary | ICD-10-CM | POA: Diagnosis not present

## 2023-08-26 DIAGNOSIS — Z7985 Long-term (current) use of injectable non-insulin antidiabetic drugs: Secondary | ICD-10-CM | POA: Diagnosis not present

## 2023-08-26 DIAGNOSIS — J45909 Unspecified asthma, uncomplicated: Secondary | ICD-10-CM | POA: Diagnosis not present

## 2023-08-28 ENCOUNTER — Ambulatory Visit: Admitting: Nurse Practitioner

## 2023-08-28 VITALS — BP 128/74 | HR 80 | Ht 62.0 in | Wt 160.0 lb

## 2023-08-28 DIAGNOSIS — R1030 Lower abdominal pain, unspecified: Secondary | ICD-10-CM | POA: Diagnosis not present

## 2023-08-28 DIAGNOSIS — K5903 Drug induced constipation: Secondary | ICD-10-CM | POA: Diagnosis not present

## 2023-08-28 NOTE — Progress Notes (Signed)
   Acute Office Visit  Subjective:    Patient ID: Debra Combs, female    DOB: [birth year], 58 y.o.   MRN: [id number]   HPI 58 y.o. presents today for abdominal pain for a few months. Pain is bilateral, lower, pressure-like but has episodes of severe cramping. Cramping can last a few minutes to hours. Ibuprofen does help. Started [place] about 4 months ago. Has had a decrease in frequency of bowel movements and takes Senna as needed. Pain is worse when she has bloating or needs to have BM. Sometimes the senna causes worse pain. Denies any urinary or vaginal symptoms.  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 Postmenopausal.   Patient's last menstrual period was 05/19/2019 (exact date).    Review of Systems  Constitutional: Negative.   Gastrointestinal:  Positive for abdominal pain and constipation. Negative for diarrhea, rectal pain and vomiting.  Genitourinary: Negative.        Objective:    Physical Exam Constitutional:      Appearance: Normal appearance.  Abdominal:     Tenderness: There is no abdominal tenderness. There is no guarding or rebound.  Genitourinary:    General: Normal vulva.     Vagina: Normal.     Cervix: Normal.     Uterus: Normal.      Adnexa: Right adnexa normal and left adnexa normal.     BP 128/74   Pulse 80   Ht 5\' 2"  (1.575 m)   Wt 160 lb (72.6 kg)   LMP 05/19/2019 (Exact Date)   SpO2 99%   BMI 29.26 kg/m  Wt Readings from Last 3 Encounters:  08/28/23 160 lb (72.6 kg)  06/06/23 166 lb 3.2 oz (75.4 kg)  02/27/23 170 lb (77.1 kg)        Patient informed chaperone available to be present for breast and/or pelvic exam. Patient has requested no chaperone to be present. Patient has been advised what will be completed during breast and pelvic exam.   Assessment & Plan:   Problem List Items Addressed This Visit   None Visit Diagnoses       Lower abdominal pain    -  Primary     Drug-induced constipation          Plan: Pain appears to be GI-related.  Discussed GI side effects that can occur with GLP-1s. Recommend stool softener daily, increase water intake. Will follow up with PCP or weight loss provider.     Olivia Mackie DNP, 2:36 PM 08/28/2023

## 2023-08-29 ENCOUNTER — Ambulatory Visit (INDEPENDENT_AMBULATORY_CARE_PROVIDER_SITE_OTHER): Payer: Self-pay

## 2023-08-29 DIAGNOSIS — J309 Allergic rhinitis, unspecified: Secondary | ICD-10-CM

## 2023-09-03 DIAGNOSIS — L700 Acne vulgaris: Secondary | ICD-10-CM | POA: Diagnosis not present

## 2023-09-03 DIAGNOSIS — Z7985 Long-term (current) use of injectable non-insulin antidiabetic drugs: Secondary | ICD-10-CM | POA: Diagnosis not present

## 2023-09-03 DIAGNOSIS — J45909 Unspecified asthma, uncomplicated: Secondary | ICD-10-CM | POA: Diagnosis not present

## 2023-09-03 DIAGNOSIS — E6609 Other obesity due to excess calories: Secondary | ICD-10-CM | POA: Diagnosis not present

## 2023-09-06 ENCOUNTER — Ambulatory Visit (INDEPENDENT_AMBULATORY_CARE_PROVIDER_SITE_OTHER)

## 2023-09-06 DIAGNOSIS — J309 Allergic rhinitis, unspecified: Secondary | ICD-10-CM

## 2023-09-10 DIAGNOSIS — E6609 Other obesity due to excess calories: Secondary | ICD-10-CM | POA: Diagnosis not present

## 2023-09-10 DIAGNOSIS — L700 Acne vulgaris: Secondary | ICD-10-CM | POA: Diagnosis not present

## 2023-09-10 DIAGNOSIS — Z7985 Long-term (current) use of injectable non-insulin antidiabetic drugs: Secondary | ICD-10-CM | POA: Diagnosis not present

## 2023-09-10 DIAGNOSIS — J45909 Unspecified asthma, uncomplicated: Secondary | ICD-10-CM | POA: Diagnosis not present

## 2023-09-11 ENCOUNTER — Other Ambulatory Visit: Payer: Self-pay

## 2023-09-11 ENCOUNTER — Telehealth: Payer: Self-pay | Admitting: Allergy & Immunology

## 2023-09-11 ENCOUNTER — Ambulatory Visit (INDEPENDENT_AMBULATORY_CARE_PROVIDER_SITE_OTHER): Payer: Self-pay

## 2023-09-11 DIAGNOSIS — J309 Allergic rhinitis, unspecified: Secondary | ICD-10-CM

## 2023-09-11 MED ORDER — RYALTRIS 665-25 MCG/ACT NA SUSP: 2.0000 | Freq: Two times a day (BID) | NASAL | 5 refills | Status: AC

## 2023-09-11 NOTE — Telephone Encounter (Signed)
 Debra Combs came in to the office stating that she tried to call [facility] specialty pharmacy several times and have left messages to request refills for Ryaltris.  She states that no one has ever called her back or anything.  She wants to know if we can resend the prescription for Ryaltris to [facility] in hopes she can get it filled.  She states she was almost out, so I gave her a sample to hold her over until she can get refills for her.

## 2023-09-18 ENCOUNTER — Ambulatory Visit
Admission: RE | Admit: 2023-09-18 | Discharge: 2023-09-18 | Disposition: A | Discharge status: Home / Self Care | Source: Ambulatory Visit | Attending: Internal Medicine | Admitting: Internal Medicine

## 2023-09-18 ENCOUNTER — Other Ambulatory Visit: Payer: Self-pay | Admitting: Internal Medicine

## 2023-09-18 DIAGNOSIS — Z1231 Encounter for screening mammogram for malignant neoplasm of breast: Secondary | ICD-10-CM

## 2023-09-19 ENCOUNTER — Ambulatory Visit (INDEPENDENT_AMBULATORY_CARE_PROVIDER_SITE_OTHER): Payer: Self-pay

## 2023-09-19 DIAGNOSIS — J309 Allergic rhinitis, unspecified: Secondary | ICD-10-CM

## 2023-09-23 DIAGNOSIS — J45909 Unspecified asthma, uncomplicated: Secondary | ICD-10-CM | POA: Diagnosis not present

## 2023-09-23 DIAGNOSIS — L700 Acne vulgaris: Secondary | ICD-10-CM | POA: Diagnosis not present

## 2023-09-23 DIAGNOSIS — E6609 Other obesity due to excess calories: Secondary | ICD-10-CM | POA: Diagnosis not present

## 2023-09-23 DIAGNOSIS — Z7985 Long-term (current) use of injectable non-insulin antidiabetic drugs: Secondary | ICD-10-CM | POA: Diagnosis not present

## 2023-09-27 ENCOUNTER — Ambulatory Visit (INDEPENDENT_AMBULATORY_CARE_PROVIDER_SITE_OTHER): Payer: Self-pay

## 2023-09-27 DIAGNOSIS — J309 Allergic rhinitis, unspecified: Secondary | ICD-10-CM | POA: Diagnosis not present

## 2023-09-30 DIAGNOSIS — E6609 Other obesity due to excess calories: Secondary | ICD-10-CM | POA: Diagnosis not present

## 2023-09-30 DIAGNOSIS — I10 Essential (primary) hypertension: Secondary | ICD-10-CM | POA: Diagnosis not present

## 2023-09-30 DIAGNOSIS — L700 Acne vulgaris: Secondary | ICD-10-CM | POA: Diagnosis not present

## 2023-09-30 DIAGNOSIS — J45909 Unspecified asthma, uncomplicated: Secondary | ICD-10-CM | POA: Diagnosis not present

## 2023-09-30 DIAGNOSIS — Z7985 Long-term (current) use of injectable non-insulin antidiabetic drugs: Secondary | ICD-10-CM | POA: Diagnosis not present

## 2023-10-04 ENCOUNTER — Ambulatory Visit (INDEPENDENT_AMBULATORY_CARE_PROVIDER_SITE_OTHER): Payer: Self-pay

## 2023-10-04 DIAGNOSIS — J309 Allergic rhinitis, unspecified: Secondary | ICD-10-CM

## 2023-10-08 ENCOUNTER — Ambulatory Visit (INDEPENDENT_AMBULATORY_CARE_PROVIDER_SITE_OTHER): Payer: Self-pay

## 2023-10-08 DIAGNOSIS — J309 Allergic rhinitis, unspecified: Secondary | ICD-10-CM

## 2023-10-09 DIAGNOSIS — I10 Essential (primary) hypertension: Secondary | ICD-10-CM | POA: Diagnosis not present

## 2023-10-09 DIAGNOSIS — Z7985 Long-term (current) use of injectable non-insulin antidiabetic drugs: Secondary | ICD-10-CM | POA: Diagnosis not present

## 2023-10-09 DIAGNOSIS — J45909 Unspecified asthma, uncomplicated: Secondary | ICD-10-CM | POA: Diagnosis not present

## 2023-10-09 DIAGNOSIS — E6609 Other obesity due to excess calories: Secondary | ICD-10-CM | POA: Diagnosis not present

## 2023-10-09 DIAGNOSIS — L700 Acne vulgaris: Secondary | ICD-10-CM | POA: Diagnosis not present

## 2023-10-15 ENCOUNTER — Ambulatory Visit (INDEPENDENT_AMBULATORY_CARE_PROVIDER_SITE_OTHER)

## 2023-10-15 DIAGNOSIS — Z7985 Long-term (current) use of injectable non-insulin antidiabetic drugs: Secondary | ICD-10-CM | POA: Diagnosis not present

## 2023-10-15 DIAGNOSIS — E6609 Other obesity due to excess calories: Secondary | ICD-10-CM | POA: Diagnosis not present

## 2023-10-15 DIAGNOSIS — J45909 Unspecified asthma, uncomplicated: Secondary | ICD-10-CM | POA: Diagnosis not present

## 2023-10-15 DIAGNOSIS — I10 Essential (primary) hypertension: Secondary | ICD-10-CM | POA: Diagnosis not present

## 2023-10-15 DIAGNOSIS — J309 Allergic rhinitis, unspecified: Secondary | ICD-10-CM

## 2023-10-15 DIAGNOSIS — L700 Acne vulgaris: Secondary | ICD-10-CM | POA: Diagnosis not present

## 2023-10-22 DIAGNOSIS — E6609 Other obesity due to excess calories: Secondary | ICD-10-CM | POA: Diagnosis not present

## 2023-10-22 DIAGNOSIS — J45909 Unspecified asthma, uncomplicated: Secondary | ICD-10-CM | POA: Diagnosis not present

## 2023-10-22 DIAGNOSIS — L700 Acne vulgaris: Secondary | ICD-10-CM | POA: Diagnosis not present

## 2023-10-22 DIAGNOSIS — I10 Essential (primary) hypertension: Secondary | ICD-10-CM | POA: Diagnosis not present

## 2023-10-22 DIAGNOSIS — Z7985 Long-term (current) use of injectable non-insulin antidiabetic drugs: Secondary | ICD-10-CM | POA: Diagnosis not present

## 2023-10-24 ENCOUNTER — Ambulatory Visit (INDEPENDENT_AMBULATORY_CARE_PROVIDER_SITE_OTHER): Payer: Self-pay

## 2023-10-24 DIAGNOSIS — J309 Allergic rhinitis, unspecified: Secondary | ICD-10-CM

## 2023-10-28 DIAGNOSIS — Z7985 Long-term (current) use of injectable non-insulin antidiabetic drugs: Secondary | ICD-10-CM | POA: Diagnosis not present

## 2023-10-28 DIAGNOSIS — I10 Essential (primary) hypertension: Secondary | ICD-10-CM | POA: Diagnosis not present

## 2023-10-28 DIAGNOSIS — L700 Acne vulgaris: Secondary | ICD-10-CM | POA: Diagnosis not present

## 2023-10-28 DIAGNOSIS — E6609 Other obesity due to excess calories: Secondary | ICD-10-CM | POA: Diagnosis not present

## 2023-10-28 DIAGNOSIS — J45909 Unspecified asthma, uncomplicated: Secondary | ICD-10-CM | POA: Diagnosis not present

## 2023-10-29 ENCOUNTER — Ambulatory Visit (INDEPENDENT_AMBULATORY_CARE_PROVIDER_SITE_OTHER): Payer: Self-pay

## 2023-10-29 DIAGNOSIS — J309 Allergic rhinitis, unspecified: Secondary | ICD-10-CM | POA: Diagnosis not present

## 2023-11-04 DIAGNOSIS — I10 Essential (primary) hypertension: Secondary | ICD-10-CM | POA: Diagnosis not present

## 2023-11-04 DIAGNOSIS — L700 Acne vulgaris: Secondary | ICD-10-CM | POA: Diagnosis not present

## 2023-11-04 DIAGNOSIS — J45909 Unspecified asthma, uncomplicated: Secondary | ICD-10-CM | POA: Diagnosis not present

## 2023-11-04 DIAGNOSIS — Z7985 Long-term (current) use of injectable non-insulin antidiabetic drugs: Secondary | ICD-10-CM | POA: Diagnosis not present

## 2023-11-04 DIAGNOSIS — E6609 Other obesity due to excess calories: Secondary | ICD-10-CM | POA: Diagnosis not present

## 2023-11-05 ENCOUNTER — Ambulatory Visit (INDEPENDENT_AMBULATORY_CARE_PROVIDER_SITE_OTHER): Payer: Self-pay

## 2023-11-05 DIAGNOSIS — J309 Allergic rhinitis, unspecified: Secondary | ICD-10-CM

## 2023-11-13 ENCOUNTER — Ambulatory Visit (INDEPENDENT_AMBULATORY_CARE_PROVIDER_SITE_OTHER): Payer: Self-pay

## 2023-11-13 DIAGNOSIS — J309 Allergic rhinitis, unspecified: Secondary | ICD-10-CM

## 2023-11-20 DIAGNOSIS — L700 Acne vulgaris: Secondary | ICD-10-CM | POA: Diagnosis not present

## 2023-11-20 DIAGNOSIS — Z7985 Long-term (current) use of injectable non-insulin antidiabetic drugs: Secondary | ICD-10-CM | POA: Diagnosis not present

## 2023-11-20 DIAGNOSIS — J45909 Unspecified asthma, uncomplicated: Secondary | ICD-10-CM | POA: Diagnosis not present

## 2023-11-20 DIAGNOSIS — E6609 Other obesity due to excess calories: Secondary | ICD-10-CM | POA: Diagnosis not present

## 2023-11-20 DIAGNOSIS — I10 Essential (primary) hypertension: Secondary | ICD-10-CM | POA: Diagnosis not present

## 2023-11-27 ENCOUNTER — Ambulatory Visit (INDEPENDENT_AMBULATORY_CARE_PROVIDER_SITE_OTHER)

## 2023-11-27 DIAGNOSIS — J309 Allergic rhinitis, unspecified: Secondary | ICD-10-CM

## 2023-11-27 DIAGNOSIS — J45909 Unspecified asthma, uncomplicated: Secondary | ICD-10-CM | POA: Diagnosis not present

## 2023-11-27 DIAGNOSIS — E6609 Other obesity due to excess calories: Secondary | ICD-10-CM | POA: Diagnosis not present

## 2023-11-27 DIAGNOSIS — L700 Acne vulgaris: Secondary | ICD-10-CM | POA: Diagnosis not present

## 2023-11-27 DIAGNOSIS — I10 Essential (primary) hypertension: Secondary | ICD-10-CM | POA: Diagnosis not present

## 2023-11-27 DIAGNOSIS — Z7985 Long-term (current) use of injectable non-insulin antidiabetic drugs: Secondary | ICD-10-CM | POA: Diagnosis not present

## 2023-12-02 ENCOUNTER — Other Ambulatory Visit: Payer: Self-pay | Admitting: Allergy & Immunology

## 2023-12-02 DIAGNOSIS — Z7985 Long-term (current) use of injectable non-insulin antidiabetic drugs: Secondary | ICD-10-CM | POA: Diagnosis not present

## 2023-12-02 DIAGNOSIS — J45909 Unspecified asthma, uncomplicated: Secondary | ICD-10-CM | POA: Diagnosis not present

## 2023-12-02 DIAGNOSIS — I10 Essential (primary) hypertension: Secondary | ICD-10-CM | POA: Diagnosis not present

## 2023-12-02 DIAGNOSIS — E6609 Other obesity due to excess calories: Secondary | ICD-10-CM | POA: Diagnosis not present

## 2023-12-02 DIAGNOSIS — L700 Acne vulgaris: Secondary | ICD-10-CM | POA: Diagnosis not present

## 2023-12-04 ENCOUNTER — Other Ambulatory Visit: Payer: Self-pay | Admitting: Allergy & Immunology

## 2023-12-06 ENCOUNTER — Ambulatory Visit (INDEPENDENT_AMBULATORY_CARE_PROVIDER_SITE_OTHER)

## 2023-12-06 DIAGNOSIS — J309 Allergic rhinitis, unspecified: Secondary | ICD-10-CM

## 2023-12-09 DIAGNOSIS — J45909 Unspecified asthma, uncomplicated: Secondary | ICD-10-CM | POA: Diagnosis not present

## 2023-12-09 DIAGNOSIS — L700 Acne vulgaris: Secondary | ICD-10-CM | POA: Diagnosis not present

## 2023-12-09 DIAGNOSIS — I10 Essential (primary) hypertension: Secondary | ICD-10-CM | POA: Diagnosis not present

## 2023-12-09 DIAGNOSIS — Z7985 Long-term (current) use of injectable non-insulin antidiabetic drugs: Secondary | ICD-10-CM | POA: Diagnosis not present

## 2023-12-09 DIAGNOSIS — E6609 Other obesity due to excess calories: Secondary | ICD-10-CM | POA: Diagnosis not present

## 2023-12-12 ENCOUNTER — Ambulatory Visit

## 2023-12-12 DIAGNOSIS — J309 Allergic rhinitis, unspecified: Secondary | ICD-10-CM

## 2023-12-16 DIAGNOSIS — J45909 Unspecified asthma, uncomplicated: Secondary | ICD-10-CM | POA: Diagnosis not present

## 2023-12-16 DIAGNOSIS — E6609 Other obesity due to excess calories: Secondary | ICD-10-CM | POA: Diagnosis not present

## 2023-12-16 DIAGNOSIS — I10 Essential (primary) hypertension: Secondary | ICD-10-CM | POA: Diagnosis not present

## 2023-12-16 DIAGNOSIS — Z7985 Long-term (current) use of injectable non-insulin antidiabetic drugs: Secondary | ICD-10-CM | POA: Diagnosis not present

## 2023-12-16 DIAGNOSIS — L700 Acne vulgaris: Secondary | ICD-10-CM | POA: Diagnosis not present

## 2023-12-20 ENCOUNTER — Ambulatory Visit

## 2023-12-20 DIAGNOSIS — J309 Allergic rhinitis, unspecified: Secondary | ICD-10-CM

## 2023-12-24 DIAGNOSIS — E6609 Other obesity due to excess calories: Secondary | ICD-10-CM | POA: Diagnosis not present

## 2023-12-24 DIAGNOSIS — J45909 Unspecified asthma, uncomplicated: Secondary | ICD-10-CM | POA: Diagnosis not present

## 2023-12-24 DIAGNOSIS — Z7985 Long-term (current) use of injectable non-insulin antidiabetic drugs: Secondary | ICD-10-CM | POA: Diagnosis not present

## 2023-12-24 DIAGNOSIS — I10 Essential (primary) hypertension: Secondary | ICD-10-CM | POA: Diagnosis not present

## 2023-12-24 DIAGNOSIS — L700 Acne vulgaris: Secondary | ICD-10-CM | POA: Diagnosis not present

## 2023-12-26 ENCOUNTER — Ambulatory Visit (INDEPENDENT_AMBULATORY_CARE_PROVIDER_SITE_OTHER)

## 2023-12-26 DIAGNOSIS — J309 Allergic rhinitis, unspecified: Secondary | ICD-10-CM | POA: Diagnosis not present

## 2023-12-30 DIAGNOSIS — Z7985 Long-term (current) use of injectable non-insulin antidiabetic drugs: Secondary | ICD-10-CM | POA: Diagnosis not present

## 2023-12-30 DIAGNOSIS — I10 Essential (primary) hypertension: Secondary | ICD-10-CM | POA: Diagnosis not present

## 2023-12-30 DIAGNOSIS — E6609 Other obesity due to excess calories: Secondary | ICD-10-CM | POA: Diagnosis not present

## 2023-12-30 DIAGNOSIS — J45909 Unspecified asthma, uncomplicated: Secondary | ICD-10-CM | POA: Diagnosis not present

## 2023-12-30 DIAGNOSIS — L700 Acne vulgaris: Secondary | ICD-10-CM | POA: Diagnosis not present

## 2023-12-31 ENCOUNTER — Ambulatory Visit (INDEPENDENT_AMBULATORY_CARE_PROVIDER_SITE_OTHER)

## 2023-12-31 DIAGNOSIS — J309 Allergic rhinitis, unspecified: Secondary | ICD-10-CM | POA: Diagnosis not present

## 2024-01-06 ENCOUNTER — Telehealth: Payer: Self-pay | Admitting: Allergy & Immunology

## 2024-01-06 DIAGNOSIS — E6609 Other obesity due to excess calories: Secondary | ICD-10-CM | POA: Diagnosis not present

## 2024-01-06 DIAGNOSIS — Z7985 Long-term (current) use of injectable non-insulin antidiabetic drugs: Secondary | ICD-10-CM | POA: Diagnosis not present

## 2024-01-06 DIAGNOSIS — L700 Acne vulgaris: Secondary | ICD-10-CM | POA: Diagnosis not present

## 2024-01-06 DIAGNOSIS — J45909 Unspecified asthma, uncomplicated: Secondary | ICD-10-CM | POA: Diagnosis not present

## 2024-01-06 DIAGNOSIS — I10 Essential (primary) hypertension: Secondary | ICD-10-CM | POA: Diagnosis not present

## 2024-01-06 NOTE — Telephone Encounter (Signed)
 Patient called stating she is needing a refill of Montelukast  sent to [facility].

## 2024-01-09 ENCOUNTER — Ambulatory Visit (INDEPENDENT_AMBULATORY_CARE_PROVIDER_SITE_OTHER)

## 2024-01-09 DIAGNOSIS — J309 Allergic rhinitis, unspecified: Secondary | ICD-10-CM

## 2024-01-10 MED ORDER — MONTELUKAST SODIUM 10 MG PO TABS: 10.0000 mg | ORAL_TABLET | Freq: Every day | ORAL | 0 refills | Status: DC

## 2024-01-10 NOTE — Telephone Encounter (Signed)
 Montelukast  sent. I called the patient and informed.

## 2024-01-14 ENCOUNTER — Ambulatory Visit (INDEPENDENT_AMBULATORY_CARE_PROVIDER_SITE_OTHER): Admitting: Family

## 2024-01-14 ENCOUNTER — Encounter: Payer: Self-pay | Admitting: Family

## 2024-01-14 VITALS — BP 122/72 | HR 72 | Temp 98.1°F | Resp 20 | Wt 151.0 lb

## 2024-01-14 DIAGNOSIS — I10 Essential (primary) hypertension: Secondary | ICD-10-CM | POA: Diagnosis not present

## 2024-01-14 DIAGNOSIS — R519 Headache, unspecified: Secondary | ICD-10-CM

## 2024-01-14 DIAGNOSIS — J45909 Unspecified asthma, uncomplicated: Secondary | ICD-10-CM | POA: Diagnosis not present

## 2024-01-14 DIAGNOSIS — J3089 Other allergic rhinitis: Secondary | ICD-10-CM | POA: Diagnosis not present

## 2024-01-14 DIAGNOSIS — E6609 Other obesity due to excess calories: Secondary | ICD-10-CM | POA: Diagnosis not present

## 2024-01-14 DIAGNOSIS — J302 Other seasonal allergic rhinitis: Secondary | ICD-10-CM | POA: Diagnosis not present

## 2024-01-14 DIAGNOSIS — L700 Acne vulgaris: Secondary | ICD-10-CM | POA: Diagnosis not present

## 2024-01-14 DIAGNOSIS — J4540 Moderate persistent asthma, uncomplicated: Secondary | ICD-10-CM

## 2024-01-14 DIAGNOSIS — Z7985 Long-term (current) use of injectable non-insulin antidiabetic drugs: Secondary | ICD-10-CM | POA: Diagnosis not present

## 2024-01-14 MED ORDER — BUDESONIDE-FORMOTEROL FUMARATE 160-4.5 MCG/ACT IN AERO: INHALATION_SPRAY | RESPIRATORY_TRACT | 1 refills | Status: AC

## 2024-01-14 NOTE — Patient Instructions (Addendum)
 1. Seasonal and perennial allergic rhinitis-controlled - Previous testing showed: grasses, ragweed, weeds, trees, indoor molds, outdoor molds, dust mites, cockroach, and tobacco - Continue avoidance measures  - Continue with: Singulair  (montelukast ) 10mg  daily - Continue : Xyzal  (levocetirizine) 5mg  tablet once daily and Ryaltris  (olopatadine/mometasone) two sprays per nostril 1-2 times daily - You can use an extra dose of the antihistamine, if needed, for breakthrough symptoms.  - Consider nasal saline rinses 1-2 times daily to remove allergens from the nasal cavities as well as help with mucous clearance (this is especially helpful to do before the nasal sprays are given) - Continue allergy  injections per protocol,but do not get injections while your asthma is flaring  2. Moderate persistent asthma not well controlled -Let me know if your breathing does not get better and I will send in a low dose of prednisone  - your breathing test looks good today and only had 2% reversibility after 4 puffs of Xopenex - Daily controller medication(s):Increase Symbicort  160/4.61mcg two puffs twice daily with spacer. Rinse mouth out after. Spacer given. Reviewed proper technique - Prior to physical activity: albuterol  2 puffs 10-15 minutes before physical activity. - Rescue medications: albuterol  4 puffs every 4-6 hours as needed - Asthma control goals:  * Full participation in all desired activities (may need albuterol  before activity) * Albuterol  use two time or less a week on average (not counting use with activity) * Cough interfering with sleep two time or less a month * Oral steroids no more than once a year * No hospitalizations  3. Nonintractable headache 95% better with current allergy  regimen  4. Follow up in 4-6 weeks or sooner if needed   Please inform us  of any Emergency Department visits, hospitalizations, or changes in symptoms. Call us  before going to the ED for breathing or allergy   symptoms since we might be able to fit you in for a sick visit. Feel free to contact us  anytime with any questions, problems, or concerns.    Websites that have reliable patient information: 1. American Academy of Asthma, Allergy , and Immunology: www.aaaai.org 2. Food Allergy  Research and Education (FARE): foodallergy.org 3. Mothers of Asthmatics: http://www.asthmacommunitynetwork.org 4. American College of Allergy , Asthma, and Immunology: www.acaai.org      Reducing Pollen Exposure  The American Academy of Allergy , Asthma and Immunology suggests the following steps to reduce your exposure to pollen during allergy  seasons.    Do not hang sheets or clothing out to dry; pollen may collect on these items. Do not mow lawns or spend time around freshly cut grass; mowing stirs up pollen. Keep windows closed at night.  Keep car windows closed while driving. Minimize morning activities outdoors, a time when pollen counts are usually at their highest. Stay indoors as much as possible when pollen counts or humidity is high and on windy days when pollen tends to remain in the air longer. Use air conditioning when possible.  Many air conditioners have filters that trap the pollen spores. Use a HEPA room air filter to remove pollen form the indoor air you breathe.  Control of Mold Allergen   Mold and fungi can grow on a variety of surfaces provided certain temperature and moisture conditions exist.  Outdoor molds grow on plants, decaying vegetation and soil.  The major outdoor mold, Alternaria and Cladosporium, are found in very high numbers during hot and dry conditions.  Generally, a late Summer - Fall peak is seen for common outdoor fungal spores.  Rain will temporarily lower outdoor mold  spore count, but counts rise rapidly when the rainy period ends.  The most important indoor molds are Aspergillus and Penicillium.  Dark, humid and poorly ventilated basements are ideal sites for mold growth.  The  next most common sites of mold growth are the bathroom and the kitchen.  Outdoor (Seasonal) Mold Control   Use air conditioning and keep windows closed Avoid exposure to decaying vegetation. Avoid leaf raking. Avoid grain handling. Consider wearing a face mask if working in moldy areas.    Indoor (Perennial) Mold Control    Maintain humidity below 50%. Clean washable surfaces with 5% bleach solution. Remove sources e.g. contaminated carpets.    Control of Dust Mite Allergen    Dust mites play a major role in allergic asthma and rhinitis.  They occur in environments with high humidity wherever human skin is found.  Dust mites absorb humidity from the atmosphere (ie, they do not drink) and feed on organic matter (including shed human and animal skin).  Dust mites are a microscopic type of insect that you cannot see with the naked eye.  High levels of dust mites have been detected from mattresses, pillows, carpets, upholstered furniture, bed covers, clothes, soft toys and any woven material.  The principal allergen of the dust mite is found in its feces.  A gram of dust may contain 1,000 mites and 250,000 fecal particles.  Mite antigen is easily measured in the air during house cleaning activities.  Dust mites do not bite and do not cause harm to humans, other than by triggering allergies/asthma.    Ways to decrease your exposure to dust mites in your home:  Encase mattresses, box springs and pillows with a mite-impermeable barrier or cover   Wash sheets, blankets and drapes weekly in hot water (130 F) with detergent and dry them in a dryer on the hot setting.  Have the room cleaned frequently with a vacuum cleaner and a damp dust-mop.  For carpeting or rugs, vacuuming with a vacuum cleaner equipped with a high-efficiency particulate air (HEPA) filter.  The dust mite allergic individual should not be in a room which is being cleaned and should wait 1 hour after cleaning before going into  the room. Do not sleep on upholstered furniture (eg, couches).   If possible removing carpeting, upholstered furniture and drapery from the home is ideal.  Horizontal blinds should be eliminated in the rooms where the person spends the most time (bedroom, study, television room).  Washable vinyl, roller-type shades are optimal. Remove all non-washable stuffed toys from the bedroom.  Wash stuffed toys weekly like sheets and blankets above.   Reduce indoor humidity to less than 50%.  Inexpensive humidity monitors can be purchased at most hardware stores.  Do not use a humidifier as can make the problem worse and are not recommended.  Control of Cockroach Allergen  Cockroach allergen has been identified as an important cause of acute attacks of asthma, especially in urban settings.  There are fifty-five species of cockroach that exist in the [place] , however only three, the [place], [place] and [place] species produce allergen that can affect patients with Asthma.  Allergens can be obtained from fecal particles, egg casings and secretions from cockroaches.    Remove food sources. Reduce access to water. Seal access and entry points. Spray runways with 0.5-1% Diazinon or Chlorpyrifos Blow boric acid power under stoves and refrigerator. Place bait stations (hydramethylnon) at feeding sites.

## 2024-01-14 NOTE — Progress Notes (Signed)
 [address] [place] [address] Dept: [phone]  FOLLOW UP NOTE  Patient ID: Debra Combs, female    DOB: 07-Jan-1966  Age: 58 y.o. MRN: [id number] Date of Office Visit: 01/14/2024  Assessment  Chief Complaint: Follow-up (Patient here for acute visit states she has had chest tightness and shortness of breath for about 2 weeks she also state she has had to use her rescue inhaler more than usual )  HPI Debra Combs is a 58 year old female who presents today for acute visit of chest tightness.  She was last seen on June 13, 2023 by Dr. Iva for seasonal and perennial allergic rhinitis, moderate persistent asthma,and non-intractable headache.  She denies any new diagnosis or surgery since her last office visit.  Moderate persistent asthma: She reports that for the past 1-1/2 to 2 weeks she has had tightness in her chest and had to use albuterol .  She also reports a nonproductive cough, raspiness in her voice, wheezing that she sometimes feels like she hears something, and nocturnal awakenings where she feels like she is not able to wear her CPAP at night.  She has been using her albuterol  once a day and reports it does help.  Since her last office visit she has not required any systemic steroids or made any trips to the emergency room or urgent care due to breathing problems.  She denies fever, chills, chest pain, arm pain, jaw pain, nausea, sweating, and vomiting.  She is taking Symbicort  160/4.5 mcg 1 puff at night rather than the 2 puffs twice a day as recommended at her last office visit.  Seasonal and perennial allergic rhinitis: She reports that this summer her symptoms have been good and she feels like it has been controlled.  Normally she would have had a bronchial infection by this point in time.  She denies rhinorrhea, nasal congestion, and postnasal drip.  She continues to receive allergy  injections per protocol.  She denies any problems or reactions with her allergy   injections.  She is currently taking montelukast  10 mg daily, Xyzal  5 mg once a day, and Ryaltris   2 sprays each nostril once a day.  She reports if she uses Ryaltris  2 sprays each nostril twice a day it will cause a nosebleed.  Non-intractable headaches: She reports that this is better and she very seldom will get 1.  She only notices that if she turns her head too quickly she feels like water is going through her head.  She feels like her allergy  regimen is helping.   Drug Allergies:  Allergies  Allergen Reactions   Avelox [Moxifloxacin Hcl In Nacl] Nausea Only    Severe stomach ache   Benzonatate Other (See Comments)    Dizziness, tingling of lips, fingers   Chlorhexidine  Gluconate     Minor redness and itching   Nitrofurantoin Monohyd Macro    Sulfa Antibiotics    Zithromax [Azithromycin Dihydrate] Nausea And Vomiting    Review of Systems: Negative except as per HPI   Physical Exam: BP 122/72 (BP Location: Left Arm, Patient Position: Sitting, Cuff Size: Normal)   Pulse 72   Temp 98.1 F (36.7 C) (Temporal)   Resp 20   Wt 151 lb (68.5 kg)   LMP 05/19/2019 (Exact Date)   SpO2 99%   BMI 27.62 kg/m    Physical Exam Constitutional:      Appearance: Normal appearance.  HENT:     Head: Normocephalic and atraumatic.     Comments:  Pharynx normal, eyes normal, ears normal, nose normal    Right Ear: Tympanic membrane, ear canal and external ear normal.     Left Ear: Tympanic membrane, ear canal and external ear normal.     Nose: Nose normal.     Mouth/Throat:     Mouth: Mucous membranes are moist.     Pharynx: Oropharynx is clear.  Eyes:     Conjunctiva/sclera: Conjunctivae normal.  Cardiovascular:     Rate and Rhythm: Regular rhythm.     Heart sounds: Normal heart sounds.  Pulmonary:     Effort: Pulmonary effort is normal.     Breath sounds: Normal breath sounds.     Comments: Lungs clear to auscultation Musculoskeletal:     Cervical back: Neck supple.  Skin:     General: Skin is warm.  Neurological:     Mental Status: She is alert and oriented to person, place, and time.  Psychiatric:        Mood and Affect: Mood normal.        Behavior: Behavior normal.        Thought Content: Thought content normal.        Judgment: Judgment normal.     Diagnostics: FVC 2.32 L (89%), FEV1 1.88 L (90%), FEV1/FVC 0.81.  Spirometry indicates normal spirometry.  4 puffs of Xopenex given.  Postbronchodilator response shows FVC 2.31 L (89%), FEV1 1.92 L (92%), FEV1/FVC 0.83.  There is a 2% change in FEV1.  Spirometry indicates normal spirometry.  Assessment and Plan: 1. Seasonal and perennial allergic rhinitis   2. Not well controlled moderate persistent asthma   3. Nonintractable headache, unspecified chronicity pattern, unspecified headache type     Meds ordered this encounter  Medications   budesonide -formoterol  (SYMBICORT ) 160-4.5 MCG/ACT inhaler    Sig: Inhale 2 puffs twice a day with spacer to help prevent cough and wheeze.  Rinse mouth out afterwards.    Dispense:  3 each    Refill:  1    Please dispense a 26-month supply.    Patient Instructions  1. Seasonal and perennial allergic rhinitis-controlled - Previous testing showed: grasses, ragweed, weeds, trees, indoor molds, outdoor molds, dust mites, cockroach, and tobacco - Continue avoidance measures  - Continue with: Singulair  (montelukast ) 10mg  daily - Continue : Xyzal  (levocetirizine) 5mg  tablet once daily and Ryaltris  (olopatadine/mometasone) two sprays per nostril 1-2 times daily - You can use an extra dose of the antihistamine, if needed, for breakthrough symptoms.  - Consider nasal saline rinses 1-2 times daily to remove allergens from the nasal cavities as well as help with mucous clearance (this is especially helpful to do before the nasal sprays are given) - Continue allergy  injections per protocol,but do not get injections while your asthma is flaring  2. Moderate persistent asthma not  well controlled -Let me know if your breathing does not get better and I will send in a low dose of prednisone  - your breathing test looks good today and only had 2% reversibility after 4 puffs of Xopenex - Daily controller medication(s):Increase Symbicort  160/4.34mcg two puffs twice daily with spacer. Rinse mouth out after. Spacer given. Reviewed proper technique - Prior to physical activity: albuterol  2 puffs 10-15 minutes before physical activity. - Rescue medications: albuterol  4 puffs every 4-6 hours as needed - Asthma control goals:  * Full participation in all desired activities (may need albuterol  before activity) * Albuterol  use two time or less a week on average (not counting use with activity) * Cough  interfering with sleep two time or less a month * Oral steroids no more than once a year * No hospitalizations  3. Nonintractable headache 95% better with current allergy  regimen  4. Follow up in 4-6 weeks or sooner if needed   Please inform us  of any Emergency Department visits, hospitalizations, or changes in symptoms. Call us  before going to the ED for breathing or allergy  symptoms since we might be able to fit you in for a sick visit. Feel free to contact us  anytime with any questions, problems, or concerns.    Websites that have reliable patient information: 1. American Academy of Asthma, Allergy , and Immunology: www.aaaai.org 2. Food Allergy  Research and Education (FARE): foodallergy.org 3. Mothers of Asthmatics: http://www.asthmacommunitynetwork.org 4. [employer] of Allergy , Asthma, and Immunology: www.acaai.org      Reducing Pollen Exposure  The American Academy of Allergy , Asthma and Immunology suggests the following steps to reduce your exposure to pollen during allergy  seasons.    Do not hang sheets or clothing out to dry; pollen may collect on these items. Do not mow lawns or spend time around freshly cut grass; mowing stirs up pollen. Keep windows  closed at night.  Keep car windows closed while driving. Minimize morning activities outdoors, a time when pollen counts are usually at their highest. Stay indoors as much as possible when pollen counts or humidity is high and on windy days when pollen tends to remain in the air longer. Use air conditioning when possible.  Many air conditioners have filters that trap the pollen spores. Use a HEPA room air filter to remove pollen form the indoor air you breathe.  Control of Mold Allergen   Mold and fungi can grow on a variety of surfaces provided certain temperature and moisture conditions exist.  Outdoor molds grow on plants, decaying vegetation and soil.  The major outdoor mold, Alternaria and Cladosporium, are found in very high numbers during hot and dry conditions.  Generally, a late Summer - Fall peak is seen for common outdoor fungal spores.  Rain will temporarily lower outdoor mold spore count, but counts rise rapidly when the rainy period ends.  The most important indoor molds are Aspergillus and Penicillium.  Dark, humid and poorly ventilated basements are ideal sites for mold growth.  The next most common sites of mold growth are the bathroom and the kitchen.  Outdoor (Seasonal) Mold Control   Use air conditioning and keep windows closed Avoid exposure to decaying vegetation. Avoid leaf raking. Avoid grain handling. Consider wearing a face mask if working in moldy areas.    Indoor (Perennial) Mold Control    Maintain humidity below 50%. Clean washable surfaces with 5% bleach solution. Remove sources e.g. contaminated carpets.    Control of Dust Mite Allergen    Dust mites play a major role in allergic asthma and rhinitis.  They occur in environments with high humidity wherever human skin is found.  Dust mites absorb humidity from the atmosphere (ie, they do not drink) and feed on organic matter (including shed human and animal skin).  Dust mites are a microscopic type of  insect that you cannot see with the naked eye.  High levels of dust mites have been detected from mattresses, pillows, carpets, upholstered furniture, bed covers, clothes, soft toys and any woven material.  The principal allergen of the dust mite is found in its feces.  A gram of dust may contain 1,000 mites and 250,000 fecal particles.  Mite antigen is easily measured in  the air during house cleaning activities.  Dust mites do not bite and do not cause harm to humans, other than by triggering allergies/asthma.    Ways to decrease your exposure to dust mites in your home:  Encase mattresses, box springs and pillows with a mite-impermeable barrier or cover   Wash sheets, blankets and drapes weekly in hot water (130 F) with detergent and dry them in a dryer on the hot setting.  Have the room cleaned frequently with a vacuum cleaner and a damp dust-mop.  For carpeting or rugs, vacuuming with a vacuum cleaner equipped with a high-efficiency particulate air (HEPA) filter.  The dust mite allergic individual should not be in a room which is being cleaned and should wait 1 hour after cleaning before going into the room. Do not sleep on upholstered furniture (eg, couches).   If possible removing carpeting, upholstered furniture and drapery from the home is ideal.  Horizontal blinds should be eliminated in the rooms where the person spends the most time (bedroom, study, television room).  Washable vinyl, roller-type shades are optimal. Remove all non-washable stuffed toys from the bedroom.  Wash stuffed toys weekly like sheets and blankets above.   Reduce indoor humidity to less than 50%.  Inexpensive humidity monitors can be purchased at most hardware stores.  Do not use a humidifier as can make the problem worse and are not recommended.  Control of Cockroach Allergen  Cockroach allergen has been identified as an important cause of acute attacks of asthma, especially in urban settings.  There are fifty-five  species of cockroach that exist in the [place] , however only three, the [place], [place] and [place] species produce allergen that can affect patients with Asthma.  Allergens can be obtained from fecal particles, egg casings and secretions from cockroaches.    Remove food sources. Reduce access to water. Seal access and entry points. Spray runways with 0.5-1% Diazinon or Chlorpyrifos Blow boric acid power under stoves and refrigerator. Place bait stations (hydramethylnon) at feeding sites.        Return in about 4 weeks (around 02/11/2024).    Thank you for the opportunity to care for this patient.  Please do not hesitate to contact me with questions.  Wanda Craze, FNP [facility] 

## 2024-01-20 ENCOUNTER — Ambulatory Visit: Admitting: Family

## 2024-01-21 DIAGNOSIS — J45909 Unspecified asthma, uncomplicated: Secondary | ICD-10-CM | POA: Diagnosis not present

## 2024-01-21 DIAGNOSIS — Z7985 Long-term (current) use of injectable non-insulin antidiabetic drugs: Secondary | ICD-10-CM | POA: Diagnosis not present

## 2024-01-21 DIAGNOSIS — L700 Acne vulgaris: Secondary | ICD-10-CM | POA: Diagnosis not present

## 2024-01-21 DIAGNOSIS — E6609 Other obesity due to excess calories: Secondary | ICD-10-CM | POA: Diagnosis not present

## 2024-01-21 DIAGNOSIS — I10 Essential (primary) hypertension: Secondary | ICD-10-CM | POA: Diagnosis not present

## 2024-01-28 ENCOUNTER — Ambulatory Visit (INDEPENDENT_AMBULATORY_CARE_PROVIDER_SITE_OTHER)

## 2024-01-28 DIAGNOSIS — J309 Allergic rhinitis, unspecified: Secondary | ICD-10-CM | POA: Diagnosis not present

## 2024-01-28 DIAGNOSIS — L700 Acne vulgaris: Secondary | ICD-10-CM | POA: Diagnosis not present

## 2024-01-28 DIAGNOSIS — J45909 Unspecified asthma, uncomplicated: Secondary | ICD-10-CM | POA: Diagnosis not present

## 2024-01-28 DIAGNOSIS — E6609 Other obesity due to excess calories: Secondary | ICD-10-CM | POA: Diagnosis not present

## 2024-01-28 DIAGNOSIS — I10 Essential (primary) hypertension: Secondary | ICD-10-CM | POA: Diagnosis not present

## 2024-01-28 DIAGNOSIS — Z7985 Long-term (current) use of injectable non-insulin antidiabetic drugs: Secondary | ICD-10-CM | POA: Diagnosis not present

## 2024-02-03 ENCOUNTER — Ambulatory Visit (INDEPENDENT_AMBULATORY_CARE_PROVIDER_SITE_OTHER): Admitting: Otolaryngology

## 2024-02-04 ENCOUNTER — Ambulatory Visit (INDEPENDENT_AMBULATORY_CARE_PROVIDER_SITE_OTHER)

## 2024-02-04 DIAGNOSIS — Z7985 Long-term (current) use of injectable non-insulin antidiabetic drugs: Secondary | ICD-10-CM | POA: Diagnosis not present

## 2024-02-04 DIAGNOSIS — L700 Acne vulgaris: Secondary | ICD-10-CM | POA: Diagnosis not present

## 2024-02-04 DIAGNOSIS — J309 Allergic rhinitis, unspecified: Secondary | ICD-10-CM

## 2024-02-04 DIAGNOSIS — E6609 Other obesity due to excess calories: Secondary | ICD-10-CM | POA: Diagnosis not present

## 2024-02-04 DIAGNOSIS — I10 Essential (primary) hypertension: Secondary | ICD-10-CM | POA: Diagnosis not present

## 2024-02-04 DIAGNOSIS — J45909 Unspecified asthma, uncomplicated: Secondary | ICD-10-CM | POA: Diagnosis not present

## 2024-02-10 ENCOUNTER — Ambulatory Visit: Payer: BC Managed Care – PPO | Admitting: Nurse Practitioner

## 2024-02-11 DIAGNOSIS — E6609 Other obesity due to excess calories: Secondary | ICD-10-CM | POA: Diagnosis not present

## 2024-02-11 DIAGNOSIS — J45909 Unspecified asthma, uncomplicated: Secondary | ICD-10-CM | POA: Diagnosis not present

## 2024-02-11 DIAGNOSIS — I10 Essential (primary) hypertension: Secondary | ICD-10-CM | POA: Diagnosis not present

## 2024-02-11 DIAGNOSIS — L700 Acne vulgaris: Secondary | ICD-10-CM | POA: Diagnosis not present

## 2024-02-11 DIAGNOSIS — Z7985 Long-term (current) use of injectable non-insulin antidiabetic drugs: Secondary | ICD-10-CM | POA: Diagnosis not present

## 2024-02-12 ENCOUNTER — Encounter: Payer: Self-pay | Admitting: Nurse Practitioner

## 2024-02-12 ENCOUNTER — Ambulatory Visit (INDEPENDENT_AMBULATORY_CARE_PROVIDER_SITE_OTHER): Admitting: Nurse Practitioner

## 2024-02-12 VITALS — BP 116/70 | HR 89 | Ht 62.0 in | Wt 148.0 lb

## 2024-02-12 DIAGNOSIS — Z1331 Encounter for screening for depression: Secondary | ICD-10-CM | POA: Diagnosis not present

## 2024-02-12 DIAGNOSIS — Z01419 Encounter for gynecological examination (general) (routine) without abnormal findings: Secondary | ICD-10-CM

## 2024-02-12 DIAGNOSIS — Z780 Asymptomatic menopausal state: Secondary | ICD-10-CM

## 2024-02-12 DIAGNOSIS — N951 Menopausal and female climacteric states: Secondary | ICD-10-CM

## 2024-02-12 MED ORDER — ESTRADIOL 0.1 MG/GM VA CREA: 1.0000 g | TOPICAL_CREAM | VAGINAL | 2 refills | Status: AC

## 2024-02-12 NOTE — Progress Notes (Signed)
 Debra Combs [birth year] [id number]   History:  58 y.o. H4E9977 presents for annual exam. Postmenopausal. Using vaginal estrogen for dryness and painful intercourse. Still having some pain with intercourse but not using estrogen consistently. 1997 cryo, subsequent paps normal. 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  Gynecologic History Patient's last menstrual period was 05/19/2019 (exact date).   Contraception/Family planning: vasectomy Sexually active: Yes  Health Maintenance Last Pap: 10/28/2019. Results were: Normal neg HPV Last mammogram: 09/18/2023. Results were: Normal Last colonoscopy: 08/2021, 5-year recall Last Dexa: Not indicated     02/12/2024    9:09 AM  Depression screen PHQ 2/9  Decreased Interest 0  Down, Depressed, Hopeless 0  PHQ - 2 Score 0     Past medical history, past surgical history, family history and social history were all reviewed and documented in the EPIC chart. Married. Works for [employer]. 91 yo daughter - CNA, has 2 yo daughter. 25 yo son - at [employer].   ROS:  A ROS was performed and pertinent positives and negatives are included.  Exam:  Vitals:   02/12/24 0903  BP: 116/70  Pulse: 89  SpO2: 99%  Weight: 148 lb (67.1 kg)  Height: 5' 2 (1.575 m)      Body mass index is 27.07 kg/m.  General appearance:  Normal Thyroid :  Symmetrical, normal in size, without palpable masses or nodularity. Respiratory  Auscultation:  Clear without wheezing or rhonchi Cardiovascular  Auscultation:  Regular rate, without rubs, murmurs or gallops  Edema/varicosities:  Not grossly evident Abdominal  Soft,nontender, without masses, guarding or rebound.  Liver/spleen:  No organomegaly noted  Hernia:  None appreciated  Skin  Inspection:  Grossly normal Breasts: Examined lying and sitting.   Right: Without masses, retractions, nipple discharge or axillary adenopathy.   Left: Without masses, retractions, nipple discharge or axillary adenopathy. Pelvic:  External genitalia:  no lesions              Urethra:  normal appearing urethra with no masses, tenderness or lesions              Bartholins and Skenes: normal                 Vagina: normal appearing vagina with normal color and discharge, no lesions              Cervix: no lesions Bimanual Exam:  Uterus:  no masses or tenderness              Adnexa: no mass, fullness, tenderness              Rectovaginal: Deferred              Anus:  normal, no lesions   Zada Louder, CMA present as chaperone.   Assessment/Plan:  58 y.o. [id number] for annual exam.   Well female exam with routine gynecological exam - Education provided on SBEs, importance of preventative screenings, current guidelines, high calcium diet, regular exercise, and multivitamin daily. Labs with PCP.   Postmenopausal - no HRT, no bleeding. Mild menopausal symptoms.  Menopausal vaginal dryness - Plan: estradiol  (ESTRACE  VAGINAL) 0.1 MG/GM vaginal cream 2-3 times weekly. Recommend consistent use for best results. [employer]ube samples provided.   Screening for cervical cancer - 1997 cryo, subsequent paps normal. Will repeat at 5-year interval per guidelines.  Screening for breast cancer - Normal mammogram history.  Continue annual screenings.  Normal breast exam today.  Screening for colon cancer - 08/2021 colonoscopy. Will repeat at [facility]  recommended interval.   Screening for osteoporosis - Average risk. Will plan DXA at age 80.   Return in about 1 year (around 02/11/2025) for Annual.      Annabella DELENA Shutter DNP, 9:30 AM 02/12/2024

## 2024-02-13 ENCOUNTER — Ambulatory Visit (INDEPENDENT_AMBULATORY_CARE_PROVIDER_SITE_OTHER)

## 2024-02-13 DIAGNOSIS — J309 Allergic rhinitis, unspecified: Secondary | ICD-10-CM

## 2024-02-18 ENCOUNTER — Ambulatory Visit (INDEPENDENT_AMBULATORY_CARE_PROVIDER_SITE_OTHER)

## 2024-02-18 DIAGNOSIS — J309 Allergic rhinitis, unspecified: Secondary | ICD-10-CM | POA: Diagnosis not present

## 2024-02-24 DIAGNOSIS — Z79899 Other long term (current) drug therapy: Secondary | ICD-10-CM | POA: Diagnosis not present

## 2024-02-24 DIAGNOSIS — L700 Acne vulgaris: Secondary | ICD-10-CM | POA: Diagnosis not present

## 2024-02-24 DIAGNOSIS — D2262 Melanocytic nevi of left upper limb, including shoulder: Secondary | ICD-10-CM | POA: Diagnosis not present

## 2024-02-25 DIAGNOSIS — I10 Essential (primary) hypertension: Secondary | ICD-10-CM | POA: Diagnosis not present

## 2024-02-25 DIAGNOSIS — E6609 Other obesity due to excess calories: Secondary | ICD-10-CM | POA: Diagnosis not present

## 2024-02-25 DIAGNOSIS — J45909 Unspecified asthma, uncomplicated: Secondary | ICD-10-CM | POA: Diagnosis not present

## 2024-02-25 DIAGNOSIS — Z7985 Long-term (current) use of injectable non-insulin antidiabetic drugs: Secondary | ICD-10-CM | POA: Diagnosis not present

## 2024-02-25 DIAGNOSIS — L700 Acne vulgaris: Secondary | ICD-10-CM | POA: Diagnosis not present

## 2024-02-27 ENCOUNTER — Ambulatory Visit (INDEPENDENT_AMBULATORY_CARE_PROVIDER_SITE_OTHER)

## 2024-02-27 DIAGNOSIS — F341 Dysthymic disorder: Secondary | ICD-10-CM | POA: Diagnosis not present

## 2024-02-27 DIAGNOSIS — J309 Allergic rhinitis, unspecified: Secondary | ICD-10-CM | POA: Diagnosis not present

## 2024-03-01 NOTE — Patient Instructions (Incomplete)
 1. Seasonal and perennial allergic rhinitis-controlled - Previous testing showed: grasses, ragweed, weeds, trees, indoor molds, outdoor molds, dust mites, cockroach, and tobacco - Continue avoidance measures  - Continue with: Singulair  (montelukast ) 10mg  daily - Continue : Xyzal  (levocetirizine) 5mg  tablet once daily and Ryaltris  (olopatadine/mometasone) two sprays per nostril 1-2 times daily - You can use an extra dose of the antihistamine, if needed, for breakthrough symptoms.  - Consider nasal saline rinses 1-2 times daily to remove allergens from the nasal cavities as well as help with mucous clearance (this is especially helpful to do before the nasal sprays are given) - Continue allergy  injections per protocol,but do not get injections while your asthma is flaring  2. Moderate persistent asthma  - Daily controller medication(s):Increase Symbicort  160/4.44mcg two puffs twice daily with spacer. Rinse mouth out after. Spacer given. Reviewed proper technique - Prior to physical activity: albuterol  2 puffs 10-15 minutes before physical activity. - Rescue medications: albuterol  4 puffs every 4-6 hours as needed - Asthma control goals:  * Full participation in all desired activities (may need albuterol  before activity) * Albuterol  use two time or less a week on average (not counting use with activity) * Cough interfering with sleep two time or less a month * Oral steroids no more than once a year * No hospitalizations  3. Nonintractable headache 95% better with current allergy  regimen  4. Follow up in months or sooner if needed   Please inform us  of any Emergency Department visits, hospitalizations, or changes in symptoms. Call us  before going to the ED for breathing or allergy  symptoms since we might be able to fit you in for a sick visit. Feel free to contact us  anytime with any questions, problems, or concerns.    Websites that have reliable patient information: 1. American Academy of  Asthma, Allergy , and Immunology: www.aaaai.org 2. Food Allergy  Research and Education (FARE): foodallergy.org 3. Mothers of Asthmatics: http://www.asthmacommunitynetwork.org 4. American College of Allergy , Asthma, and Immunology: www.acaai.org      Reducing Pollen Exposure  The American Academy of Allergy , Asthma and Immunology suggests the following steps to reduce your exposure to pollen during allergy  seasons.    Do not hang sheets or clothing out to dry; pollen may collect on these items. Do not mow lawns or spend time around freshly cut grass; mowing stirs up pollen. Keep windows closed at night.  Keep car windows closed while driving. Minimize morning activities outdoors, a time when pollen counts are usually at their highest. Stay indoors as much as possible when pollen counts or humidity is high and on windy days when pollen tends to remain in the air longer. Use air conditioning when possible.  Many air conditioners have filters that trap the pollen spores. Use a HEPA room air filter to remove pollen form the indoor air you breathe.  Control of Mold Allergen   Mold and fungi can grow on a variety of surfaces provided certain temperature and moisture conditions exist.  Outdoor molds grow on plants, decaying vegetation and soil.  The major outdoor mold, Alternaria and Cladosporium, are found in very high numbers during hot and dry conditions.  Generally, a late Summer - Fall peak is seen for common outdoor fungal spores.  Rain will temporarily lower outdoor mold spore count, but counts rise rapidly when the rainy period ends.  The most important indoor molds are Aspergillus and Penicillium.  Dark, humid and poorly ventilated basements are ideal sites for mold growth.  The next most common sites  of mold growth are the bathroom and the kitchen.  Outdoor (Seasonal) Mold Control   Use air conditioning and keep windows closed Avoid exposure to decaying vegetation. Avoid leaf  raking. Avoid grain handling. Consider wearing a face mask if working in moldy areas.    Indoor (Perennial) Mold Control    Maintain humidity below 50%. Clean washable surfaces with 5% bleach solution. Remove sources e.g. contaminated carpets.    Control of Dust Mite Allergen    Dust mites play a major role in allergic asthma and rhinitis.  They occur in environments with high humidity wherever human skin is found.  Dust mites absorb humidity from the atmosphere (ie, they do not drink) and feed on organic matter (including shed human and animal skin).  Dust mites are a microscopic type of insect that you cannot see with the naked eye.  High levels of dust mites have been detected from mattresses, pillows, carpets, upholstered furniture, bed covers, clothes, soft toys and any woven material.  The principal allergen of the dust mite is found in its feces.  A gram of dust may contain 1,000 mites and 250,000 fecal particles.  Mite antigen is easily measured in the air during house cleaning activities.  Dust mites do not bite and do not cause harm to humans, other than by triggering allergies/asthma.    Ways to decrease your exposure to dust mites in your home:  Encase mattresses, box springs and pillows with a mite-impermeable barrier or cover   Wash sheets, blankets and drapes weekly in hot water (130 F) with detergent and dry them in a dryer on the hot setting.  Have the room cleaned frequently with a vacuum cleaner and a damp dust-mop.  For carpeting or rugs, vacuuming with a vacuum cleaner equipped with a high-efficiency particulate air (HEPA) filter.  The dust mite allergic individual should not be in a room which is being cleaned and should wait 1 hour after cleaning before going into the room. Do not sleep on upholstered furniture (eg, couches).   If possible removing carpeting, upholstered furniture and drapery from the home is ideal.  Horizontal blinds should be eliminated in the rooms  where the person spends the most time (bedroom, study, television room).  Washable vinyl, roller-type shades are optimal. Remove all non-washable stuffed toys from the bedroom.  Wash stuffed toys weekly like sheets and blankets above.   Reduce indoor humidity to less than 50%.  Inexpensive humidity monitors can be purchased at most hardware stores.  Do not use a humidifier as can make the problem worse and are not recommended.  Control of Cockroach Allergen  Cockroach allergen has been identified as an important cause of acute attacks of asthma, especially in urban settings.  There are fifty-five species of cockroach that exist in the [place] , however only three, the [place], [place] and [place] species produce allergen that can affect patients with Asthma.  Allergens can be obtained from fecal particles, egg casings and secretions from cockroaches.    Remove food sources. Reduce access to water. Seal access and entry points. Spray runways with 0.5-1% Diazinon or Chlorpyrifos Blow boric acid power under stoves and refrigerator. Place bait stations (hydramethylnon) at feeding sites.

## 2024-03-02 ENCOUNTER — Ambulatory Visit (INDEPENDENT_AMBULATORY_CARE_PROVIDER_SITE_OTHER): Admitting: Family

## 2024-03-02 ENCOUNTER — Encounter: Payer: Self-pay | Admitting: Family

## 2024-03-02 ENCOUNTER — Other Ambulatory Visit: Payer: Self-pay

## 2024-03-02 VITALS — BP 124/76 | HR 84 | Temp 98.0°F | Resp 18

## 2024-03-02 DIAGNOSIS — J302 Other seasonal allergic rhinitis: Secondary | ICD-10-CM

## 2024-03-02 DIAGNOSIS — J4540 Moderate persistent asthma, uncomplicated: Secondary | ICD-10-CM

## 2024-03-02 DIAGNOSIS — J3089 Other allergic rhinitis: Secondary | ICD-10-CM

## 2024-03-02 DIAGNOSIS — R519 Headache, unspecified: Secondary | ICD-10-CM

## 2024-03-02 MED ORDER — ALBUTEROL SULFATE HFA 108 (90 BASE) MCG/ACT IN AERS: INHALATION_SPRAY | RESPIRATORY_TRACT | 1 refills | Status: AC

## 2024-03-02 NOTE — Progress Notes (Signed)
 [address] [place] [address] Dept: [phone]  FOLLOW UP NOTE  Patient ID: Debra Combs, female    DOB: 17-Nov-1965  Age: 58 y.o. MRN: [id number] Date of Office Visit: 03/02/2024  Assessment  Chief Complaint: Allergic Rhinitis  (Doing good. ) and Asthma (Some chest tightness but has resolved much better since doing maintenance inhaler correctly. )  HPI Debra Combs is a 58 year old female who presents today for follow-up of seasonal and perennial allergic rhinitis, not well-controlled moderate persistent asthma, non-intractable headache.  She was last seen on January 14, 2024 by myself.  She denies any new diagnosis or surgery since her last office visit.  Seasonal and perennial allergic rhinitis: She reports rhinorrhea lately and postnasal drip at night.  She also noticed that if she sniffs that her ears will close up.  Also, if she blows her nose her ears will stop up, especially the left ear.  This will last for about an hour and  it goes away. She denies nasal congestion.   She is currently taking Singulair  10 mg daily, Xyzal  5 mg daily, and Ryaltris  nasal spray at night.  She continues to receive allergy  injections per protocol.  She reports the past 2 weeks her injection site in the area gets really big and will hurt and be itchy for a few days.  She denies any cardiorespiratory or gastrointestinal symptoms.    Moderate persistent asthma: She reports that her breathing has been a a lot better since using Symbicort  160/4.5 mcg 2 puffs twice a day with a spacer.  She does occasionally have tightness in her chest from time to time.  This occurs maybe 2-3 times a week.  She denies cough, wheeze, shortness of breath, and nocturnal awakenings due to breathing problems.  Since her last office visit she has not required any systemic steroids or made any trips to the emergency room or urgent care due to breathing problems.  Right now she is not sure where her albuterol  inhaler is  located.  She recently moved and she has been searching boxes.  Non-intractable headaches occur every once in a while.  She notices this more if she turns over too quickly.  Her headaches are nothing like what they were before.  Drug Allergies:  Allergies  Allergen Reactions   Avelox [Moxifloxacin Hcl In Nacl] Nausea Only    Severe stomach ache   Benzonatate Other (See Comments)    Dizziness, tingling of lips, fingers   Chlorhexidine  Gluconate     Minor redness and itching   Nitrofurantoin Monohyd Macro    Sulfa Antibiotics    Zithromax [Azithromycin Dihydrate] Nausea And Vomiting    Review of Systems: Negative except as per HPI   Physical Exam: BP 124/76   Pulse 84   Temp 98 F (36.7 C)   Resp 18   LMP 05/19/2019 (Exact Date)   SpO2 97%    Physical Exam Constitutional:      Appearance: Normal appearance.  HENT:     Head: Normocephalic and atraumatic.     Comments: Pharynx normal, eyes normal, ears normal, nose: Bilateral lower turbinates moderately edematous with no drainage noted    Right Ear: Tympanic membrane, ear canal and external ear normal.     Left Ear: Tympanic membrane, ear canal and external ear normal.     Mouth/Throat:     Mouth: Mucous membranes are moist.     Pharynx: Oropharynx is clear.  Eyes:     Conjunctiva/sclera: Conjunctivae  normal.  Cardiovascular:     Rate and Rhythm: Regular rhythm.     Heart sounds: Normal heart sounds.  Pulmonary:     Effort: Pulmonary effort is normal.     Breath sounds: Normal breath sounds.     Comments: Lungs clear to auscultation Musculoskeletal:     Cervical back: Neck supple.  Skin:    General: Skin is warm.  Neurological:     Mental Status: She is alert and oriented to person, place, and time.  Psychiatric:        Mood and Affect: Mood normal.        Behavior: Behavior normal.        Thought Content: Thought content normal.        Judgment: Judgment normal.     Diagnostics: FVC 2.5 L (98%), FEV1  1.95 L (94%), FEV1/FVC 0.77.  Spirometry indicates normal spirometry.  Assessment and Plan: 1. Seasonal and perennial allergic rhinitis   2. Moderate persistent asthma without complication   3. Nonintractable headache, unspecified chronicity pattern, unspecified headache type     Meds ordered this encounter  Medications   albuterol  (VENTOLIN  HFA) 108 (90 Base) MCG/ACT inhaler    Sig: Inhale 2 puffs every 4-6 hours as needed for cough, wheeze, tightness chest, or shortness of breath    Dispense:  1 each    Refill:  1    Patient Instructions  1. Seasonal and perennial allergic rhinitis- moderately controlled - Previous testing showed: grasses, ragweed, weeds, trees, indoor molds, outdoor molds, dust mites, cockroach, and tobacco - Continue avoidance measures  - Continue with: Singulair  (montelukast ) 10mg  daily - Continue : Xyzal  (levocetirizine) 5mg  tablet once daily and increase Ryaltris  (olopatadine/mometasone) two sprays per nostril 1-2 times daily - You can use an extra dose of the antihistamine, if needed, for breakthrough symptoms.  - Recommend increasing Xyzal  to twice a day on days that you get your allergy  injections. We will also add on epinephrine  rinse due to your large local reactions. We may consider freezing your allergy  injection dose for a period of time if you continue to have large local reactions - Consider nasal saline rinses 1-2 times daily to remove allergens from the nasal cavities as well as help with mucous clearance (this is especially helpful to do before the nasal sprays are given) - Continue allergy  injections and have access to your epinephrine  auto injector device  2. Moderate persistent asthma -better control - Daily controller medication(s):Increase Symbicort  160/4.68mcg two puffs twice daily with spacer. Rinse mouth out after. Spacer given. Reviewed proper technique - Prior to physical activity: albuterol  2 puffs 10-15 minutes before physical activity. -  Rescue medications: albuterol  4 puffs every 4-6 hours as needed - Asthma control goals:  * Full participation in all desired activities (may need albuterol  before activity) * Albuterol  use two time or less a week on average (not counting use with activity) * Cough interfering with sleep two time or less a month * Oral steroids no more than once a year * No hospitalizations  3. Nonintractable headache 95% better with current allergy  regimen  4. Follow up in 3-4 months or sooner if needed   Please inform us  of any Emergency Department visits, hospitalizations, or changes in symptoms. Call us  before going to the ED for breathing or allergy  symptoms since we might be able to fit you in for a sick visit. Feel free to contact us  anytime with any questions, problems, or concerns.    Websites that have  reliable patient information: 1. American Academy of Asthma, Allergy , and Immunology: www.aaaai.org 2. Food Allergy  Research and Education (FARE): foodallergy.org 3. Mothers of Asthmatics: http://www.asthmacommunitynetwork.org 4. [employer] of Allergy , Asthma, and Immunology: www.acaai.org      Reducing Pollen Exposure  The American Academy of Allergy , Asthma and Immunology suggests the following steps to reduce your exposure to pollen during allergy  seasons.    Do not hang sheets or clothing out to dry; pollen may collect on these items. Do not mow lawns or spend time around freshly cut grass; mowing stirs up pollen. Keep windows closed at night.  Keep car windows closed while driving. Minimize morning activities outdoors, a time when pollen counts are usually at their highest. Stay indoors as much as possible when pollen counts or humidity is high and on windy days when pollen tends to remain in the air longer. Use air conditioning when possible.  Many air conditioners have filters that trap the pollen spores. Use a HEPA room air filter to remove pollen form the indoor air you  breathe.  Control of Mold Allergen   Mold and fungi can grow on a variety of surfaces provided certain temperature and moisture conditions exist.  Outdoor molds grow on plants, decaying vegetation and soil.  The major outdoor mold, Alternaria and Cladosporium, are found in very high numbers during hot and dry conditions.  Generally, a late Summer - Fall peak is seen for common outdoor fungal spores.  Rain will temporarily lower outdoor mold spore count, but counts rise rapidly when the rainy period ends.  The most important indoor molds are Aspergillus and Penicillium.  Dark, humid and poorly ventilated basements are ideal sites for mold growth.  The next most common sites of mold growth are the bathroom and the kitchen.  Outdoor (Seasonal) Mold Control   Use air conditioning and keep windows closed Avoid exposure to decaying vegetation. Avoid leaf raking. Avoid grain handling. Consider wearing a face mask if working in moldy areas.    Indoor (Perennial) Mold Control    Maintain humidity below 50%. Clean washable surfaces with 5% bleach solution. Remove sources e.g. contaminated carpets.    Control of Dust Mite Allergen    Dust mites play a major role in allergic asthma and rhinitis.  They occur in environments with high humidity wherever human skin is found.  Dust mites absorb humidity from the atmosphere (ie, they do not drink) and feed on organic matter (including shed human and animal skin).  Dust mites are a microscopic type of insect that you cannot see with the naked eye.  High levels of dust mites have been detected from mattresses, pillows, carpets, upholstered furniture, bed covers, clothes, soft toys and any woven material.  The principal allergen of the dust mite is found in its feces.  A gram of dust may contain 1,000 mites and 250,000 fecal particles.  Mite antigen is easily measured in the air during house cleaning activities.  Dust mites do not bite and do not cause harm  to humans, other than by triggering allergies/asthma.    Ways to decrease your exposure to dust mites in your home:  Encase mattresses, box springs and pillows with a mite-impermeable barrier or cover   Wash sheets, blankets and drapes weekly in hot water (130 F) with detergent and dry them in a dryer on the hot setting.  Have the room cleaned frequently with a vacuum cleaner and a damp dust-mop.  For carpeting or rugs, vacuuming with a vacuum cleaner  equipped with a high-efficiency particulate air (HEPA) filter.  The dust mite allergic individual should not be in a room which is being cleaned and should wait 1 hour after cleaning before going into the room. Do not sleep on upholstered furniture (eg, couches).   If possible removing carpeting, upholstered furniture and drapery from the home is ideal.  Horizontal blinds should be eliminated in the rooms where the person spends the most time (bedroom, study, television room).  Washable vinyl, roller-type shades are optimal. Remove all non-washable stuffed toys from the bedroom.  Wash stuffed toys weekly like sheets and blankets above.   Reduce indoor humidity to less than 50%.  Inexpensive humidity monitors can be purchased at most hardware stores.  Do not use a humidifier as can make the problem worse and are not recommended.  Control of Cockroach Allergen  Cockroach allergen has been identified as an important cause of acute attacks of asthma, especially in urban settings.  There are fifty-five species of cockroach that exist in the [place] , however only three, the [place], [place] and [place] species produce allergen that can affect patients with Asthma.  Allergens can be obtained from fecal particles, egg casings and secretions from cockroaches.    Remove food sources. Reduce access to water. Seal access and entry points. Spray runways with 0.5-1% Diazinon or Chlorpyrifos Blow boric acid power under stoves and refrigerator. Place bait  stations (hydramethylnon) at feeding sites.       Return in about 3 months (around 06/02/2024), or if symptoms worsen or fail to improve.    Thank you for the opportunity to care for this patient.  Please do not hesitate to contact me with questions.  Wanda Craze, FNP [facility] 

## 2024-03-10 DIAGNOSIS — Z23 Encounter for immunization: Secondary | ICD-10-CM | POA: Diagnosis not present

## 2024-03-17 ENCOUNTER — Ambulatory Visit (INDEPENDENT_AMBULATORY_CARE_PROVIDER_SITE_OTHER)

## 2024-03-17 DIAGNOSIS — J309 Allergic rhinitis, unspecified: Secondary | ICD-10-CM | POA: Diagnosis not present

## 2024-03-20 DIAGNOSIS — E559 Vitamin D deficiency, unspecified: Secondary | ICD-10-CM | POA: Diagnosis not present

## 2024-03-20 DIAGNOSIS — R252 Cramp and spasm: Secondary | ICD-10-CM | POA: Diagnosis not present

## 2024-03-20 DIAGNOSIS — R7989 Other specified abnormal findings of blood chemistry: Secondary | ICD-10-CM | POA: Diagnosis not present

## 2024-03-27 ENCOUNTER — Ambulatory Visit (INDEPENDENT_AMBULATORY_CARE_PROVIDER_SITE_OTHER): Admitting: *Deleted

## 2024-03-27 DIAGNOSIS — J309 Allergic rhinitis, unspecified: Secondary | ICD-10-CM

## 2024-04-02 ENCOUNTER — Ambulatory Visit

## 2024-04-02 DIAGNOSIS — J309 Allergic rhinitis, unspecified: Secondary | ICD-10-CM | POA: Diagnosis not present

## 2024-04-06 DIAGNOSIS — N390 Urinary tract infection, site not specified: Secondary | ICD-10-CM | POA: Diagnosis not present

## 2024-04-09 ENCOUNTER — Ambulatory Visit

## 2024-04-09 DIAGNOSIS — J309 Allergic rhinitis, unspecified: Secondary | ICD-10-CM

## 2024-04-17 ENCOUNTER — Ambulatory Visit (INDEPENDENT_AMBULATORY_CARE_PROVIDER_SITE_OTHER)

## 2024-04-17 DIAGNOSIS — J309 Allergic rhinitis, unspecified: Secondary | ICD-10-CM | POA: Diagnosis not present

## 2024-04-22 ENCOUNTER — Ambulatory Visit (INDEPENDENT_AMBULATORY_CARE_PROVIDER_SITE_OTHER)

## 2024-04-22 DIAGNOSIS — J309 Allergic rhinitis, unspecified: Secondary | ICD-10-CM | POA: Diagnosis not present

## 2024-04-29 DIAGNOSIS — E6609 Other obesity due to excess calories: Secondary | ICD-10-CM | POA: Diagnosis not present

## 2024-04-29 DIAGNOSIS — L700 Acne vulgaris: Secondary | ICD-10-CM | POA: Diagnosis not present

## 2024-04-29 DIAGNOSIS — I10 Essential (primary) hypertension: Secondary | ICD-10-CM | POA: Diagnosis not present

## 2024-04-29 DIAGNOSIS — J45909 Unspecified asthma, uncomplicated: Secondary | ICD-10-CM | POA: Diagnosis not present

## 2024-04-30 ENCOUNTER — Ambulatory Visit

## 2024-04-30 DIAGNOSIS — J309 Allergic rhinitis, unspecified: Secondary | ICD-10-CM | POA: Diagnosis not present

## 2024-05-05 DIAGNOSIS — I10 Essential (primary) hypertension: Secondary | ICD-10-CM | POA: Diagnosis not present

## 2024-05-05 DIAGNOSIS — L700 Acne vulgaris: Secondary | ICD-10-CM | POA: Diagnosis not present

## 2024-05-05 DIAGNOSIS — E6609 Other obesity due to excess calories: Secondary | ICD-10-CM | POA: Diagnosis not present

## 2024-05-05 DIAGNOSIS — J45909 Unspecified asthma, uncomplicated: Secondary | ICD-10-CM | POA: Diagnosis not present

## 2024-05-07 ENCOUNTER — Ambulatory Visit (INDEPENDENT_AMBULATORY_CARE_PROVIDER_SITE_OTHER)

## 2024-05-07 DIAGNOSIS — J309 Allergic rhinitis, unspecified: Secondary | ICD-10-CM

## 2024-05-07 NOTE — Progress Notes (Signed)
 VIALS MADE ON 05/07/24

## 2024-05-08 DIAGNOSIS — J302 Other seasonal allergic rhinitis: Secondary | ICD-10-CM | POA: Diagnosis not present

## 2024-05-08 DIAGNOSIS — J3089 Other allergic rhinitis: Secondary | ICD-10-CM | POA: Diagnosis not present

## 2024-05-08 DIAGNOSIS — J301 Allergic rhinitis due to pollen: Secondary | ICD-10-CM | POA: Diagnosis not present

## 2024-05-14 ENCOUNTER — Ambulatory Visit (INDEPENDENT_AMBULATORY_CARE_PROVIDER_SITE_OTHER)

## 2024-05-14 DIAGNOSIS — J309 Allergic rhinitis, unspecified: Secondary | ICD-10-CM

## 2024-05-18 ENCOUNTER — Other Ambulatory Visit: Payer: Self-pay | Admitting: Allergy & Immunology

## 2024-05-19 ENCOUNTER — Ambulatory Visit

## 2024-05-26 ENCOUNTER — Ambulatory Visit (INDEPENDENT_AMBULATORY_CARE_PROVIDER_SITE_OTHER): Admitting: *Deleted

## 2024-05-26 DIAGNOSIS — J309 Allergic rhinitis, unspecified: Secondary | ICD-10-CM

## 2024-06-02 ENCOUNTER — Ambulatory Visit (INDEPENDENT_AMBULATORY_CARE_PROVIDER_SITE_OTHER)

## 2024-06-02 DIAGNOSIS — J302 Other seasonal allergic rhinitis: Secondary | ICD-10-CM

## 2024-06-02 DIAGNOSIS — J3089 Other allergic rhinitis: Secondary | ICD-10-CM | POA: Diagnosis not present

## 2024-06-12 ENCOUNTER — Ambulatory Visit

## 2024-06-12 DIAGNOSIS — J302 Other seasonal allergic rhinitis: Secondary | ICD-10-CM

## 2024-06-18 ENCOUNTER — Ambulatory Visit

## 2024-06-18 DIAGNOSIS — J302 Other seasonal allergic rhinitis: Secondary | ICD-10-CM | POA: Diagnosis not present

## 2025-02-12 ENCOUNTER — Ambulatory Visit: Admitting: Nurse Practitioner

## 2025-02-15 ENCOUNTER — Ambulatory Visit: Admitting: Nurse Practitioner
